# Patient Record
Sex: Female | Born: 1965 | Race: Black or African American | Hispanic: No | Smoking: Never smoker
Health system: Southern US, Community
[De-identification: ages and names within clinical notes are randomized; demographics above are authoritative.]

## PROBLEM LIST (undated history)

## (undated) DIAGNOSIS — K219 Gastro-esophageal reflux disease without esophagitis: Secondary | ICD-10-CM

## (undated) DIAGNOSIS — M1990 Unspecified osteoarthritis, unspecified site: Secondary | ICD-10-CM

## (undated) DIAGNOSIS — Z86010 Personal history of colonic polyps: Secondary | ICD-10-CM

## (undated) DIAGNOSIS — F430 Acute stress reaction: Secondary | ICD-10-CM

## (undated) DIAGNOSIS — R61 Generalized hyperhidrosis: Secondary | ICD-10-CM

## (undated) DIAGNOSIS — T7840XA Allergy, unspecified, initial encounter: Secondary | ICD-10-CM

## (undated) DIAGNOSIS — D219 Benign neoplasm of connective and other soft tissue, unspecified: Secondary | ICD-10-CM

## (undated) HISTORY — PX: BREAST SURGERY: SHX581

## (undated) HISTORY — DX: Personal history of colonic polyps: Z86.010

## (undated) HISTORY — DX: Generalized hyperhidrosis: R61

## (undated) HISTORY — DX: Gastro-esophageal reflux disease without esophagitis: K21.9

## (undated) HISTORY — DX: Unspecified osteoarthritis, unspecified site: M19.90

## (undated) HISTORY — DX: Allergy, unspecified, initial encounter: T78.40XA

## (undated) HISTORY — PX: COLONOSCOPY: SHX174

## (undated) HISTORY — PX: HYSTEROSCOPY: SHX211

## (undated) HISTORY — PX: POLYPECTOMY: SHX149

## (undated) HISTORY — PX: BREAST BIOPSY: SHX20

## (undated) HISTORY — DX: Benign neoplasm of connective and other soft tissue, unspecified: D21.9

## (undated) HISTORY — PX: MYOMECTOMY: SHX85

## (undated) HISTORY — DX: Acute stress reaction: F43.0

## (undated) HISTORY — PX: TUBAL LIGATION: SHX77

---

## 1997-09-16 ENCOUNTER — Encounter: Admission: RE | Admit: 1997-09-16 | Discharge: 1997-12-15 | Payer: Self-pay | Admitting: Gynecology

## 1997-09-28 ENCOUNTER — Ambulatory Visit (HOSPITAL_COMMUNITY): Admission: RE | Admit: 1997-09-28 | Discharge: 1997-09-28 | Payer: Self-pay | Admitting: Obstetrics and Gynecology

## 1997-10-11 ENCOUNTER — Encounter (HOSPITAL_COMMUNITY): Admission: RE | Admit: 1997-10-11 | Discharge: 1997-10-15 | Payer: Self-pay | Admitting: Gynecology

## 1997-10-12 ENCOUNTER — Inpatient Hospital Stay (HOSPITAL_COMMUNITY): Admission: AD | Admit: 1997-10-12 | Discharge: 1997-10-14 | Payer: Self-pay | Admitting: Gynecology

## 1997-10-22 ENCOUNTER — Encounter (HOSPITAL_COMMUNITY): Admission: RE | Admit: 1997-10-22 | Discharge: 1997-12-22 | Payer: Self-pay | Admitting: Gynecology

## 1997-11-12 ENCOUNTER — Inpatient Hospital Stay (HOSPITAL_COMMUNITY): Admission: AD | Admit: 1997-11-12 | Discharge: 1997-11-12 | Payer: Self-pay | Admitting: Obstetrics and Gynecology

## 1997-11-19 ENCOUNTER — Inpatient Hospital Stay (HOSPITAL_COMMUNITY): Admission: AD | Admit: 1997-11-19 | Discharge: 1997-11-19 | Payer: Self-pay | Admitting: Gynecology

## 1997-11-23 ENCOUNTER — Encounter (HOSPITAL_COMMUNITY): Admission: RE | Admit: 1997-11-23 | Discharge: 1997-12-22 | Payer: Self-pay | Admitting: Obstetrics and Gynecology

## 1997-12-21 ENCOUNTER — Inpatient Hospital Stay (HOSPITAL_COMMUNITY): Admission: AD | Admit: 1997-12-21 | Discharge: 1997-12-24 | Payer: Self-pay | Admitting: Gynecology

## 1999-08-12 ENCOUNTER — Encounter (INDEPENDENT_AMBULATORY_CARE_PROVIDER_SITE_OTHER): Payer: Self-pay | Admitting: *Deleted

## 1999-08-12 ENCOUNTER — Ambulatory Visit (HOSPITAL_BASED_OUTPATIENT_CLINIC_OR_DEPARTMENT_OTHER): Admission: RE | Admit: 1999-08-12 | Discharge: 1999-08-12 | Payer: Self-pay | Admitting: Surgery

## 1999-08-12 ENCOUNTER — Encounter: Payer: Self-pay | Admitting: Surgery

## 2001-03-19 ENCOUNTER — Encounter: Payer: Self-pay | Admitting: *Deleted

## 2001-03-19 ENCOUNTER — Ambulatory Visit (HOSPITAL_COMMUNITY): Admission: RE | Admit: 2001-03-19 | Discharge: 2001-03-19 | Payer: Self-pay | Admitting: *Deleted

## 2002-04-08 ENCOUNTER — Other Ambulatory Visit: Admission: RE | Admit: 2002-04-08 | Discharge: 2002-04-08 | Payer: Self-pay | Admitting: Gynecology

## 2003-04-13 ENCOUNTER — Other Ambulatory Visit: Admission: RE | Admit: 2003-04-13 | Discharge: 2003-04-13 | Payer: Self-pay | Admitting: Gynecology

## 2003-10-26 ENCOUNTER — Ambulatory Visit (HOSPITAL_COMMUNITY): Admission: RE | Admit: 2003-10-26 | Discharge: 2003-10-26 | Payer: Self-pay | Admitting: Family Medicine

## 2004-04-19 ENCOUNTER — Ambulatory Visit: Payer: Self-pay | Admitting: Family Medicine

## 2004-04-22 ENCOUNTER — Other Ambulatory Visit: Admission: RE | Admit: 2004-04-22 | Discharge: 2004-04-22 | Payer: Self-pay | Admitting: Gynecology

## 2004-05-04 ENCOUNTER — Ambulatory Visit: Payer: Self-pay | Admitting: Family Medicine

## 2004-07-04 ENCOUNTER — Ambulatory Visit: Payer: Self-pay | Admitting: Family Medicine

## 2004-09-09 ENCOUNTER — Ambulatory Visit: Payer: Self-pay | Admitting: Internal Medicine

## 2004-11-25 ENCOUNTER — Ambulatory Visit: Payer: Self-pay | Admitting: Family Medicine

## 2004-12-01 ENCOUNTER — Ambulatory Visit: Payer: Self-pay | Admitting: Family Medicine

## 2004-12-07 ENCOUNTER — Ambulatory Visit: Payer: Self-pay | Admitting: Family Medicine

## 2005-01-03 ENCOUNTER — Ambulatory Visit: Payer: Self-pay | Admitting: Family Medicine

## 2005-01-06 ENCOUNTER — Ambulatory Visit: Payer: Self-pay

## 2005-02-22 ENCOUNTER — Ambulatory Visit: Payer: Self-pay | Admitting: Family Medicine

## 2005-04-03 ENCOUNTER — Encounter: Payer: Self-pay | Admitting: Family Medicine

## 2005-04-03 LAB — CONVERTED CEMR LAB

## 2005-04-25 ENCOUNTER — Other Ambulatory Visit: Admission: RE | Admit: 2005-04-25 | Discharge: 2005-04-25 | Payer: Self-pay | Admitting: Gynecology

## 2005-08-02 ENCOUNTER — Encounter: Payer: Self-pay | Admitting: Obstetrics and Gynecology

## 2005-08-11 ENCOUNTER — Ambulatory Visit: Payer: Self-pay | Admitting: Family Medicine

## 2005-09-05 ENCOUNTER — Ambulatory Visit (HOSPITAL_BASED_OUTPATIENT_CLINIC_OR_DEPARTMENT_OTHER): Admission: RE | Admit: 2005-09-05 | Discharge: 2005-09-05 | Payer: Self-pay | Admitting: Surgery

## 2005-09-05 ENCOUNTER — Encounter (INDEPENDENT_AMBULATORY_CARE_PROVIDER_SITE_OTHER): Payer: Self-pay | Admitting: *Deleted

## 2006-02-08 ENCOUNTER — Ambulatory Visit: Payer: Self-pay | Admitting: Family Medicine

## 2006-04-26 ENCOUNTER — Other Ambulatory Visit: Admission: RE | Admit: 2006-04-26 | Discharge: 2006-04-26 | Payer: Self-pay | Admitting: Gynecology

## 2006-08-01 ENCOUNTER — Encounter: Payer: Self-pay | Admitting: Family Medicine

## 2006-08-01 DIAGNOSIS — F411 Generalized anxiety disorder: Secondary | ICD-10-CM | POA: Insufficient documentation

## 2006-08-01 DIAGNOSIS — F418 Other specified anxiety disorders: Secondary | ICD-10-CM

## 2006-08-14 ENCOUNTER — Ambulatory Visit: Payer: Self-pay | Admitting: Family Medicine

## 2006-08-17 ENCOUNTER — Encounter: Payer: Self-pay | Admitting: Family Medicine

## 2006-08-30 ENCOUNTER — Encounter (INDEPENDENT_AMBULATORY_CARE_PROVIDER_SITE_OTHER): Payer: Self-pay | Admitting: *Deleted

## 2006-09-05 ENCOUNTER — Encounter (INDEPENDENT_AMBULATORY_CARE_PROVIDER_SITE_OTHER): Payer: Self-pay | Admitting: *Deleted

## 2006-09-28 ENCOUNTER — Ambulatory Visit: Payer: Self-pay | Admitting: Internal Medicine

## 2006-09-28 DIAGNOSIS — R079 Chest pain, unspecified: Secondary | ICD-10-CM | POA: Insufficient documentation

## 2007-01-24 ENCOUNTER — Ambulatory Visit: Payer: Self-pay | Admitting: Family Medicine

## 2007-01-25 DIAGNOSIS — M26609 Unspecified temporomandibular joint disorder, unspecified side: Secondary | ICD-10-CM

## 2007-01-25 DIAGNOSIS — S0300XA Dislocation of jaw, unspecified side, initial encounter: Secondary | ICD-10-CM | POA: Insufficient documentation

## 2007-04-04 LAB — CONVERTED CEMR LAB: Pap Smear: NORMAL

## 2007-04-29 ENCOUNTER — Other Ambulatory Visit: Admission: RE | Admit: 2007-04-29 | Discharge: 2007-04-29 | Payer: Self-pay | Admitting: Gynecology

## 2007-05-14 ENCOUNTER — Ambulatory Visit: Payer: Self-pay | Admitting: Family Medicine

## 2007-05-14 DIAGNOSIS — M545 Low back pain, unspecified: Secondary | ICD-10-CM | POA: Insufficient documentation

## 2007-05-14 DIAGNOSIS — M549 Dorsalgia, unspecified: Secondary | ICD-10-CM | POA: Insufficient documentation

## 2007-07-19 ENCOUNTER — Ambulatory Visit: Payer: Self-pay | Admitting: Family Medicine

## 2007-09-30 ENCOUNTER — Encounter: Payer: Self-pay | Admitting: Family Medicine

## 2007-10-02 ENCOUNTER — Encounter (INDEPENDENT_AMBULATORY_CARE_PROVIDER_SITE_OTHER): Payer: Self-pay | Admitting: *Deleted

## 2007-10-22 ENCOUNTER — Ambulatory Visit: Payer: Self-pay | Admitting: Family Medicine

## 2007-10-22 DIAGNOSIS — L74519 Primary focal hyperhidrosis, unspecified: Secondary | ICD-10-CM | POA: Insufficient documentation

## 2007-10-23 LAB — CONVERTED CEMR LAB
ALT: 11 units/L (ref 0–35)
AST: 20 units/L (ref 0–37)
Albumin: 4.1 g/dL (ref 3.5–5.2)
Alkaline Phosphatase: 60 units/L (ref 39–117)
BUN: 8 mg/dL (ref 6–23)
Basophils Absolute: 0 10*3/uL (ref 0.0–0.1)
Basophils Relative: 0.3 % (ref 0.0–3.0)
Bilirubin, Direct: 0.1 mg/dL (ref 0.0–0.3)
CO2: 29 meq/L (ref 19–32)
Calcium: 9.5 mg/dL (ref 8.4–10.5)
Chloride: 102 meq/L (ref 96–112)
Cholesterol: 196 mg/dL (ref 0–200)
Creatinine, Ser: 0.8 mg/dL (ref 0.4–1.2)
Eosinophils Absolute: 0 10*3/uL (ref 0.0–0.7)
Eosinophils Relative: 0.6 % (ref 0.0–5.0)
GFR calc Af Amer: 101 mL/min
GFR calc non Af Amer: 83 mL/min
Glucose, Bld: 88 mg/dL (ref 70–99)
HCT: 33.9 % — ABNORMAL LOW (ref 36.0–46.0)
HDL: 75.1 mg/dL (ref >39.0)
Hemoglobin: 11.3 g/dL — ABNORMAL LOW (ref 12.0–15.0)
LDL Cholesterol: 106 mg/dL — ABNORMAL HIGH (ref 0–99)
Lymphocytes Relative: 39.7 % (ref 12.0–46.0)
MCHC: 33.3 g/dL (ref 30.0–36.0)
MCV: 85.3 fL (ref 78.0–100.0)
Monocytes Absolute: 0.3 10*3/uL (ref 0.1–1.0)
Monocytes Relative: 5 % (ref 3.0–12.0)
Neutro Abs: 3 10*3/uL (ref 1.4–7.7)
Neutrophils Relative %: 54.4 % (ref 43.0–77.0)
Platelets: 343 10*3/uL (ref 150–400)
Potassium: 4.5 meq/L (ref 3.5–5.1)
RBC: 3.97 M/uL (ref 3.87–5.11)
RDW: 15.5 % — ABNORMAL HIGH (ref 11.5–14.6)
Sodium: 137 meq/L (ref 135–145)
TSH: 1.07 microintl units/mL (ref 0.35–5.50)
Total Bilirubin: 0.9 mg/dL (ref 0.3–1.2)
Total CHOL/HDL Ratio: 2.6
Total Protein: 7 g/dL (ref 6.0–8.3)
Triglycerides: 77 mg/dL (ref 0–149)
VLDL: 15 mg/dL (ref 0–40)
WBC: 5.4 10*3/uL (ref 4.5–10.5)

## 2007-12-24 ENCOUNTER — Ambulatory Visit: Payer: Self-pay | Admitting: Gynecology

## 2008-01-15 ENCOUNTER — Ambulatory Visit: Payer: Self-pay | Admitting: Gynecology

## 2008-01-17 ENCOUNTER — Ambulatory Visit: Payer: Self-pay | Admitting: Family Medicine

## 2008-01-17 LAB — CONVERTED CEMR LAB
Bacteria, UA: 0
Bilirubin Urine: NEGATIVE
Blood in Urine, dipstick: NEGATIVE
Casts: 0 /lpf
Epithelial cells, urine: 1 /lpf
Glucose, Urine, Semiquant: NEGATIVE
Ketones, urine, test strip: NEGATIVE
Mucus, UA: 0
Nitrite: NEGATIVE
Protein, U semiquant: NEGATIVE
RBC / HPF: 0
Specific Gravity, Urine: <1.005
Urine crystals, microscopic: 0 /hpf
Urobilinogen, UA: 0.2
WBC, UA: 0 cells/hpf
Yeast, UA: 0
pH: 7

## 2008-02-07 ENCOUNTER — Ambulatory Visit: Payer: Self-pay | Admitting: Gynecology

## 2008-02-13 ENCOUNTER — Ambulatory Visit: Payer: Self-pay | Admitting: Gynecology

## 2008-02-13 ENCOUNTER — Encounter: Payer: Self-pay | Admitting: Gynecology

## 2008-02-13 ENCOUNTER — Ambulatory Visit (HOSPITAL_BASED_OUTPATIENT_CLINIC_OR_DEPARTMENT_OTHER): Admission: RE | Admit: 2008-02-13 | Discharge: 2008-02-13 | Payer: Self-pay | Admitting: Gynecology

## 2008-03-03 ENCOUNTER — Ambulatory Visit: Payer: Self-pay | Admitting: Gynecology

## 2008-05-04 ENCOUNTER — Encounter: Payer: Self-pay | Admitting: Gynecology

## 2008-05-04 ENCOUNTER — Ambulatory Visit: Payer: Self-pay | Admitting: Gynecology

## 2008-05-04 ENCOUNTER — Other Ambulatory Visit: Admission: RE | Admit: 2008-05-04 | Discharge: 2008-05-04 | Payer: Self-pay | Admitting: Gynecology

## 2008-08-03 ENCOUNTER — Telehealth: Payer: Self-pay | Admitting: Family Medicine

## 2008-08-05 ENCOUNTER — Ambulatory Visit: Payer: Self-pay | Admitting: Family Medicine

## 2008-08-05 LAB — CONVERTED CEMR LAB
Bilirubin Urine: NEGATIVE
Blood in Urine, dipstick: NEGATIVE
Glucose, Urine, Semiquant: NEGATIVE
Ketones, urine, test strip: NEGATIVE
Nitrite: NEGATIVE
Protein, U semiquant: NEGATIVE
Specific Gravity, Urine: <1.005
Urobilinogen, UA: 0.2
WBC Urine, dipstick: NEGATIVE
pH: 7

## 2008-09-01 ENCOUNTER — Encounter: Payer: Self-pay | Admitting: Family Medicine

## 2008-09-17 ENCOUNTER — Ambulatory Visit: Payer: Self-pay | Admitting: Family Medicine

## 2008-09-30 ENCOUNTER — Encounter: Payer: Self-pay | Admitting: Family Medicine

## 2008-10-06 ENCOUNTER — Encounter (INDEPENDENT_AMBULATORY_CARE_PROVIDER_SITE_OTHER): Payer: Self-pay | Admitting: *Deleted

## 2008-10-22 ENCOUNTER — Ambulatory Visit: Payer: Self-pay | Admitting: Family Medicine

## 2008-12-21 ENCOUNTER — Ambulatory Visit: Payer: Self-pay | Admitting: Gynecology

## 2008-12-24 ENCOUNTER — Ambulatory Visit: Payer: Self-pay | Admitting: Gynecology

## 2008-12-28 ENCOUNTER — Telehealth: Payer: Self-pay | Admitting: Family Medicine

## 2009-01-12 ENCOUNTER — Encounter: Payer: Self-pay | Admitting: Family Medicine

## 2009-01-13 ENCOUNTER — Ambulatory Visit: Payer: Self-pay | Admitting: Family Medicine

## 2009-02-17 ENCOUNTER — Ambulatory Visit: Payer: Self-pay | Admitting: Family Medicine

## 2009-02-17 LAB — CONVERTED CEMR LAB
Bilirubin Urine: NEGATIVE
Blood in Urine, dipstick: NEGATIVE
Casts: 0 /lpf
Glucose, Urine, Semiquant: NEGATIVE
Ketones, urine, test strip: NEGATIVE
Nitrite: NEGATIVE
Protein, U semiquant: NEGATIVE
Specific Gravity, Urine: <1.005
Urine crystals, microscopic: 0 /hpf
Urobilinogen, UA: 0.2
WBC Urine, dipstick: NEGATIVE
Yeast, UA: 0
pH: 7

## 2009-02-19 ENCOUNTER — Telehealth: Payer: Self-pay | Admitting: Family Medicine

## 2009-05-12 ENCOUNTER — Ambulatory Visit: Payer: Self-pay | Admitting: Gynecology

## 2009-05-12 ENCOUNTER — Other Ambulatory Visit: Admission: RE | Admit: 2009-05-12 | Discharge: 2009-05-12 | Payer: Self-pay | Admitting: Gynecology

## 2009-05-24 ENCOUNTER — Ambulatory Visit: Payer: Self-pay | Admitting: Gynecology

## 2009-06-14 ENCOUNTER — Emergency Department: Payer: Self-pay | Admitting: Emergency Medicine

## 2009-08-02 ENCOUNTER — Ambulatory Visit: Payer: Self-pay | Admitting: Gynecology

## 2009-08-25 ENCOUNTER — Telehealth: Payer: Self-pay | Admitting: Family Medicine

## 2009-09-06 ENCOUNTER — Ambulatory Visit: Payer: Self-pay | Admitting: Gynecology

## 2009-10-08 ENCOUNTER — Encounter: Payer: Self-pay | Admitting: Family Medicine

## 2009-10-12 ENCOUNTER — Encounter: Payer: Self-pay | Admitting: Family Medicine

## 2009-10-13 ENCOUNTER — Ambulatory Visit: Payer: Self-pay | Admitting: Family Medicine

## 2009-10-18 ENCOUNTER — Encounter: Payer: Self-pay | Admitting: Family Medicine

## 2009-10-22 ENCOUNTER — Ambulatory Visit: Payer: Self-pay | Admitting: Gynecology

## 2009-11-02 ENCOUNTER — Ambulatory Visit: Payer: Self-pay | Admitting: Family Medicine

## 2009-11-12 ENCOUNTER — Ambulatory Visit: Payer: Self-pay | Admitting: Gynecology

## 2009-12-02 ENCOUNTER — Ambulatory Visit: Payer: Self-pay | Admitting: Gynecology

## 2010-01-31 ENCOUNTER — Ambulatory Visit: Payer: Self-pay | Admitting: Gynecology

## 2010-03-24 ENCOUNTER — Ambulatory Visit: Payer: Self-pay | Admitting: Gynecology

## 2010-05-03 NOTE — Letter (Signed)
Summary: Results Follow up Letter  [place] [facility]  [address] [place], [place] [address]   Phone: [phone]  Fax: [phone]    10/12/2009 MRN: [id number]    Peggy Hicks [address] [place], [place]  [address]    Dear [name],  The following are the results of your recent test(s):  Test         Result    Pap Smear:        Normal _____  Not Normal _____ Comments: ______________________________________________________ Cholesterol: LDL(Bad cholesterol):         Your goal is less than:         HDL (Good cholesterol):       Your goal is more than: Comments:  ______________________________________________________ Mammogram:        Normal __X___  Not Normal _____ Comments:Repeat in one year.   ___________________________________________________________________ Hemoccult:        Normal _____  Not normal _______ Comments:    _____________________________________________________________________ Other Tests:    We routinely do not discuss normal results over the telephone.  If you desire a copy of the results, or you have any questions about this information we can discuss them at your next office visit.   Sincerely,    Idamae Schuller Tower,MD  MT/ri

## 2010-05-03 NOTE — Miscellaneous (Signed)
Summary: mammogram screening  Clinical Lists Changes  Observations: Added new observation of MAMMO DUE: 10/2010 (10/12/2009 11:15) Added new observation of MAMMOGRAM: normal (10/08/2009 11:15)      Preventive Care Screening  Mammogram:    Date:  10/08/2009    Next Due:  10/2010    Results:  normal

## 2010-05-03 NOTE — Assessment & Plan Note (Signed)
Summary: cpx R/S FROM 11/03/09   Vital Signs:  Patient profile:   45 year old female Height:      66 inches Weight:      154.25 pounds BMI:     24.99 Temp:     98.2 degrees F oral Pulse rate:   64 / minute Pulse rhythm:   regular BP sitting:   122 / 80  (left arm) Cuff size:   regular  Vitals Entered By: Lewanda Rife LPN (November 02, 2009 3:39 PM) CC: CPX GYN does pap and breast exam   History of Present Illness: here for wellness exam and to rev chronic med problems  is doing well overall   has just been tx for sinus infection - getting better   wt is down 2 lb- did not eat lunch toda   bp good 122/80  lipids good with trig 74 and HDL 102 and LDL 92  fibroids removed in past gyn jan -- pap was normal  mam 7/11 - neg, no lumps or changes on self exam   Td 07  is trying to walk at least 3 miles every other day -- [facility] [employer] when weather is too hot  is eating healthy diet   low back is hurting today -- thinks due to period      Allergies (verified): No Known Drug Allergies  Past History:  Past Medical History: Last updated: 01/17/2008 Anxiety Frequent yeast vaginitis fibroids in past  back pain hyperhidrosis TMJ stress reaction  Past Surgical History: Last updated: 08/01/2006 breast reduction uterine fibrods removed  Family History: Last updated: 10/22/2007 mother with HTN, CVA, DM  father is healthy aunt with colon ca  Social History: Last updated: 10/22/2007 works for [employer] exercises regularly  non smoker no alcohol   Risk Factors: Smoking Status: never (08/01/2006)  Review of Systems General:  Denies fatigue, loss of appetite, and malaise. Eyes:  Denies blurring and eye irritation. CV:  Denies chest pain or discomfort, palpitations, and shortness of breath with exertion. Resp:  Denies cough, shortness of breath, and wheezing. GI:  Denies abdominal pain, bloody stools, change in bowel habits, indigestion, and nausea. GU:   Denies dysuria and urinary frequency. MS:  Complains of low back pain; denies cramps and muscle weakness. Derm:  Denies lesion(s), poor wound healing, and rash. Neuro:  Denies numbness and tingling. Psych:  Denies anxiety and depression. Endo:  Denies cold intolerance, excessive thirst, excessive urination, and heat intolerance. Heme:  Denies abnormal bruising and bleeding.  Physical Exam  General:  Well-developed,well-nourished,in no acute distress; alert,appropriate and cooperative throughout examination Head:  normocephalic, atraumatic, and no abnormalities observed.   Eyes:  vision grossly intact, pupils equal, pupils round, and pupils reactive to light.  no conjunctival pallor, injection or icterus  Ears:  R ear normal and L ear normal.   Nose:  no nasal discharge.   Mouth:  pharynx pink and moist.   Neck:  supple with full rom and no masses or thyromegally, no JVD or carotid bruit  Chest Wall:  No deformities, masses, or tenderness noted. Lungs:  Normal respiratory effort, chest expands symmetrically. Lungs are clear to auscultation, no crackles or wheezes. Heart:  Normal rate and regular rhythm. S1 and S2 normal without gallop, murmur, click, rub or other extra sounds. Abdomen:  Bowel sounds positive,abdomen soft and non-tender without masses, organomegaly or hernias noted. no renal bruits  Msk:  No deformity or scoliosis noted of thoracic or lumbar spine.  Pulses:  R and L carotid,radial,femoral,dorsalis pedis and posterior tibial pulses are full and equal bilaterally Extremities:  No clubbing, cyanosis, edema, or deformity noted with normal full range of motion of all joints.   Neurologic:  sensation intact to light touch, gait normal, and DTRs symmetrical and normal.   Skin:  Intact without suspicious lesions or rashes Cervical Nodes:  No lymphadenopathy noted Inguinal Nodes:  No significant adenopathy Psych:  normal affect, talkative and pleasant    Impression &  Recommendations:  Problem # 1:  HEALTH MAINTENANCE EXAM (ICD-V70.0) Assessment Comment Only reviewed health habits including diet, exercise and skin cancer prevention reviewed health maintenance list and family history commended on good habits labs reviewed in detail  Problem # 2:  BACK PAIN (ICD-724.5) Assessment: Deteriorated intermittent worse with menses refil flexeril adv to update if worse or not imp next week Her updated medication list for this problem includes:    Flexeril 10 Mg Tabs (Cyclobenzaprine hcl) .Marland Kitchen... 1/2 to 1 by mouth up to three times a day as needed back pain    Aleve 220 Mg Tabs (Naproxen sodium) ..... Otc as directed.  Complete Medication List: 1)  One-a-day Womens Tabs (multiple Vitamins-calcium)-- Iron  .... One by mouth daily 2)  Iron Supplement 325 (65 Fe) Mg Tabs (Ferrous sulfate) .... One by mouth daily 3)  Alprazolam 0.5 Mg Tabs (Alprazolam) .Marland Kitchen.. 1 by mouth up to two times a day as needed airplane anxiety 4)  Flexeril 10 Mg Tabs (Cyclobenzaprine hcl) .... 1/2 to 1 by mouth up to three times a day as needed back pain 5)  Fluticasone Propionate 50 Mcg/act Susp (Fluticasone propionate) .... 2 sprays each nostril once daily as needed 6)  Aleve 220 Mg Tabs (Naproxen sodium) .... Otc as directed.  Patient Instructions: 1)  keep up the good work with healthy diet and exercise  2)  labs look good including cholesterol  Prescriptions: FLEXERIL 10 MG TABS (CYCLOBENZAPRINE HCL) 1/2 to 1 by mouth up to three times a day as needed back pain  #30 x 0   Entered and Authorized by:   Judith Part MD   Signed by:   Judith Part MD on 11/02/2009   Method used:   [facility] to        [facility] [phone][facility] (retail)       [address]       [place], [place]  [address]       Ph: [phone]       Fax: [phone]   RxID:   [id number]   Current Allergies (reviewed today): No known allergies

## 2010-05-03 NOTE — Miscellaneous (Signed)
Summary: Controlled Substance Agreement  Controlled Substance Agreement   Imported By: Lanelle Bal 11/09/2009 08:52:45  _____________________________________________________________________  External Attachment:    Type:   Image     Comment:   External Document

## 2010-05-03 NOTE — Progress Notes (Signed)
Summary: labs for cpx  Phone Note Call from Patient Call back at Home Phone [phone]   Caller: Patient Call For: Judith Part MD Summary of Call: Patient has cpx on 11-03-09. She needs rx for labs so that she can have them done at [employer] where she works. She would like them mailed to her house.  Initial call taken by: Melody Comas,  Aug 25, 2009 12:07 PM  Follow-up for Phone Call        px for labs done for wellness Follow-up by: Judith Part MD,  Aug 25, 2009 2:42 PM  Additional Follow-up for Phone Call Additional follow up Details #1::        Left message for patient to call back.  Order for labs mailed to pt's home address.Lewanda Rife LPN  Aug 25, 2009 4:38 PM   Left message for patient to call back. Lewanda Rife LPN  Aug 26, 2009 11:12 AM   Patient's husband notified as instructed by telephone.Lewanda Rife LPN  Aug 27, 2009 8:18 AM      Appended Document: labs for cpx Pt called to verify lab order was mailed. Patient notified as instructed by telephone.

## 2010-05-03 NOTE — Assessment & Plan Note (Signed)
Summary: JAW PAIN/CLE  Chest pain/shoulder pain/   Vital Signs:  Patient Profile:   45 Years Old Female Height:     66 inches (167.64 cm) Weight:      161.50 pounds Temp:     98.1 degrees F oral Pulse rate:   76 / minute Pulse rhythm:   regular BP sitting:   110 / 74  (left arm) Cuff size:   regular  Vitals Entered ByMarland Kitchen Delilah Shan (January 24, 2007 12:19 PM)                 Chief Complaint:  Woke up last evening with pain in jaw area as well as other areas of her body.Marland Kitchen  History of Present Illness: recent heart burn x 4 days, some insomnia sudden last night awoke from sleep with chest pain under right breats also some tenderness in left shoulder and right jaw  all symptoms lasted 1 minute, mild started with jaw then to chest and shoulder no nausea, no sweating, no SOB no chest pain with exercise  Seen in 6/08 with similar  no family , 6 with MI doesn't smoke nml chol no HTN no PVD, no DM  Current Allergies (reviewed today): No known allergies   Past Medical History:    Reviewed history from 08/01/2006 and no changes required:       Anxiety       Frequent yeast vaginitis   Family History:    Reviewed history from 08/14/2006 and no changes required:       mother with HTN, CVA       aunt with colon ca    Review of Systems      See HPI   Physical Exam  General:     Well-developed,well-nourished,in no acute distress; alert,appropriate and cooperative throughout examination Head:     Normocephalic and atraumatic without obvious abnormalities. No apparent alopecia or balding. Ears:     External ear exam shows no significant lesions or deformities.  Otoscopic examination reveals clear canals, tympanic membranes are intact bilaterally without bulging, retraction, inflammation or discharge. Hearing is grossly normal bilaterally. Nose:     External nasal examination shows no deformity or inflammation. Nasal mucosa are pink and moist without lesions or  exudates. Mouth:     ttp over right TMJ, no popping or clicking Lungs:     Normal respiratory effort, chest expands symmetrically. Lungs are clear to auscultation, no crackles or wheezes. Heart:     Normal rate and regular rhythm. S1 and S2 normal without gallop, murmur, click, rub or other extra sounds. Abdomen:     Bowel sounds positive,abdomen soft and non-tender without masses, organomegaly or hernias noted.    Impression & Recommendations:  Problem # 1:  CHEST PAIN (ICD-786.50) Doubt cardiac etiology given description of chest pain and lack of cardiac risk factors.  EKG wnl.  Most likely pain from reflux, pt was given behavoiral modification info.  If not better may try prilosec.  if concern remains, discuss stress test with primary MD. 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  Use NSAIDs, mouth gaurd, stop gum chewing.   Patient Instructions: 1)  If behavoiral techniques don't help, try prilosec 2 x 20 mg daily x 4-6 weeks.  If still not better f/up with primary MD.    ] Prior Medications (reviewed today): None Current Allergies (reviewed today): No known allergies    EKG  Procedure date:  01/24/2007  Findings:      Normal sinus rhythm with rate of:  58, no ST chenges, no Q waves

## 2010-05-03 NOTE — Assessment & Plan Note (Signed)
Summary: HEADACHE/ lb   Vital Signs:  Patient profile:   45 year old female Weight:      156 pounds BMI:     25.27 Temp:     99.1 degrees F oral Pulse rate:   84 / minute Pulse rhythm:   regular BP sitting:   122 / 80  (left arm) Cuff size:   regular CC: Headache   History of Present Illness: waking up with a headache frontal and mostly maxillary sinus tenderness for 3 days.  No recent URI or illness, but she does have some occ allergies. Occ benadryl use.  no light or sound bothering.   no earache, n/v/d, no rashes.    Allergies (verified): No Known Drug Allergies  Past History:  Past medical, surgical, family and social histories (including risk factors) reviewed, and no changes noted (except as noted below).  Past Medical History: Reviewed history from 01/17/2008 and no changes required. Anxiety Frequent yeast vaginitis fibroids in past  back pain hyperhidrosis TMJ stress reaction  Past Surgical History: Reviewed history from 08/01/2006 and no changes required. breast reduction uterine fibrods removed  Family History: Reviewed history from 10/22/2007 and no changes required. mother with HTN, CVA, DM  father is healthy aunt with colon ca  Social History: Reviewed history from 10/22/2007 and no changes required. works for [employer] exercises regularly  non smoker no alcohol   Review of Systems       REVIEW OF SYSTEMS GEN: Acute illness details above. CV: No chest pain or SOB GI: No noted N or V Otherwise, pertinent positives and negatives are noted in the HPI.   Physical Exam  Additional Exam:  Gen: WDWN, NAD; alert,appropriate and cooperative throughout exam  HEENT: Normocephalic and atraumatic. Throat clear, w/o exudate, no LAD, R TM clear, L TM - good landmarks, No fluid present. rhinnorhea.  Left frontal and maxillary sinuses: Tender, max Right frontal and maxillary sinuses: Tender, max  Neck: No ant or post LAD  CV: RRR, No  M/G/R  Pulm: Breathing comfortably in no resp distress. no w/c/r  Abd: S,NT,ND,+BS  Extr: no c/c/e  Psych: full affect, pleasant    Impression & Recommendations:  Problem # 1:  SINUSITIS - ACUTE-NOS (ICD-461.9) Assessment New I believe c/w sinusitis  cont with tylenol, ibuprofen amox  flonase to help with nasal cong / allergy  Her updated medication list for this problem includes:    Cipro 250 Mg Tabs (Ciprofloxacin hcl) .Marland Kitchen... 1 by mouth two times a day for 5 days    Amoxicillin 875 Mg Tabs (Amoxicillin) .Marland Kitchen... 1 by mouth two times a day    Fluticasone Propionate 50 Mcg/act Susp (Fluticasone propionate) .Marland Kitchen... 2 sprays each nostril once daily  Problem # 2:  OTHER DISEASES OF NASAL CAVITY AND SINUSES (ICD-478.19) Assessment: New  Complete Medication List: 1)  One-a-day Womens Tabs (multiple Vitamins-calcium)-- Iron  .... One by mouth daily 2)  Iron Supplement 325 (65 Fe) Mg Tabs (Ferrous sulfate) .... One by mouth daily 3)  Drysol 20 % Soln (Aluminum chloride) .... Apply to affected area (palms of hands ) at bedtime for 2 nights weekly (and wash off the next am) 4)  Aleve  .... As needed back pain 5)  Alprazolam 0.5 Mg Tabs (Alprazolam) .Marland Kitchen.. 1 by mouth up to two times a day as needed airplane anxiety 6)  Flexeril 10 Mg Tabs (Cyclobenzaprine hcl) .... 1/2 to 1 by mouth up to three times a day as needed back pain 7)  Cipro  250 Mg Tabs (Ciprofloxacin hcl) .Marland Kitchen.. 1 by mouth two times a day for 5 days 8)  Amoxicillin 875 Mg Tabs (Amoxicillin) .Marland Kitchen.. 1 by mouth two times a day 9)  Fluticasone Propionate 50 Mcg/act Susp (Fluticasone propionate) .... 2 sprays each nostril once daily Prescriptions: FLUTICASONE PROPIONATE 50 MCG/ACT  SUSP (FLUTICASONE PROPIONATE) 2 sprays each nostril once daily  #1 vial x 0   Entered and Authorized by:   Hannah Beat MD   Signed by:   Hannah Beat MD on 10/13/2009   Method used:   [facility]  [address] [phone]       [address]       [place], [place]  [address]       Ph: [phone]       Fax: [phone]   RxID:   [id number] AMOXICILLIN 875 MG TABS (AMOXICILLIN) 1 by mouth two times a day  #20 x 0   Entered and Authorized by:   Hannah Beat MD   Signed by:   Hannah Beat MD on 10/13/2009   Method used:   [facility]        [facility]  [address] [phone][facility] (retail)       [address]       [place], [place]  [address]       Ph: [phone]       Fax: [phone]   RxID:   [id number]   Current Allergies (reviewed today): No known allergies

## 2010-05-03 NOTE — Assessment & Plan Note (Signed)
Summary: LOW BACK PAIN/CLE   Vital Signs:  Patient profile:   45 year old female Height:      66 inches Weight:      160 pounds BMI:     25.92 Temp:     98 degrees F oral Pulse rate:   68 / minute Pulse rhythm:   regular BP sitting:   120 / 80  (left arm) Cuff size:   regular  Vitals Entered By: Liane Comber (Aug 05, 2008 12:04 PM)  History of Present Illness: problems with lower back pain  was intially sharp when she moved certain ways or walked (last wed) took some flexeril - helped some  then advil  still hurts - but not as sharp no particular trauma- may have been from twisting in the car  is bilateral  no radiation to either leg  no numbness or weakness  laying down makes it worse , and not to bad with walking now  hurts to twist  was fine beforehand - [facility] [employer] [facility] [employer]-- really enjoyed it    Allergies: No Known Drug Allergies  Past History:  Past Medical History:    Anxiety    Frequent yeast vaginitis    fibroids in past     back pain    hyperhidrosis    TMJ    stress reaction     (01/17/2008)  Past Surgical History:    breast reduction    uterine fibrods removed (08/01/2006)  Family History:    mother with HTN, CVA, DM     father is healthy    aunt with colon ca (10/22/2007)  Social History:    works for [employer]    exercises regularly     non smoker    no alcohol  (10/22/2007)  Review of Systems General:  Denies chills, fatigue, and fever. CV:  Denies chest pain or discomfort and palpitations. Resp:  Denies cough. GI:  Denies abdominal pain and indigestion; advil does not bother her stomach. GU:  Denies dysuria, nocturia, and urinary frequency. MS:  Denies joint redness, joint swelling, cramps, and muscle weakness. Derm:  Denies lesion(s) and rash. Neuro:  Denies numbness, tingling, and weakness.  Physical Exam  General:  Well-developed,well-nourished,in no acute distress; alert,appropriate and cooperative throughout  examination Head:  normocephalic, atraumatic, and no abnormalities observed.   Neck:  nl rom no tenderness  Lungs:  Normal respiratory effort, chest expands symmetrically. Lungs are clear to auscultation, no crackles or wheezes. Heart:  Normal rate and regular rhythm. S1 and S2 normal without gallop, murmur, click, rub or other extra sounds. Abdomen:  soft and non-tender.   Msk:  no LS bony tenderness  some muscular tenderness lateral to LS bilat with spasm /palpable  no piriformis tenderness nl rom hips neg slr for leg pain  flex 90deg, full ext  pain on lateral bend and twist   Extremities:  No clubbing, cyanosis, edema, or deformity noted with normal full range of motion of all joints.   Neurologic:  strength normal in all extremities, sensation intact to light touch, gait normal, and DTRs symmetrical and normal.   Skin:  Intact without suspicious lesions or rashes Cervical Nodes:  No lymphadenopathy noted Inguinal Nodes:  No significant adenopathy Psych:  normal affect, talkative and pleasant    Impression & Recommendations:  Problem # 1:  BACK PAIN (ICD-724.5) Assessment Deteriorated recurrent low back pain without radiculopathhy or neurol symptoms  exam consistent with spasm that is improving  disc  sympt care/ heat /stretches  advil ok as needed/ flexeril for more severe pain  ref to PT for pain modalities and exercises to prevent re- occurance  if not imp consider imaging   Her updated medication list for this problem includes:    Flexeril 10 Mg Tabs (Cyclobenzaprine hcl) .Marland Kitchen... 1/2 to 1 by mouth three times a day as needed back pain  Orders: Physical Therapy Referral (PT)  Complete Medication List: 1)  Flexeril 10 Mg Tabs (Cyclobenzaprine hcl) .... 1/2 to 1 by mouth three times a day as needed back pain 2)  One-a-day Womens Tabs (multiple Vitamins-calcium)-- Iron  .... One by mouth daily 3)  Iron Supplement 325 (65 Fe) Mg Tabs (Ferrous sulfate) .... One by mouth  daily 4)  Drysol 20 % Soln (Aluminum chloride) .... Apply to affected area (palms of hands ) at bedtime for 2 nights weekly (and wash off the next am)  Patient Instructions: 1)  use gentle heat on low back  2)  keep stretching  3)  advil ok with food as needed  4)  flexeril for more severe pain- as needed / caution of sedation  5)  we will refer you to PT at check out  6)  update me if pain suddenly worsens or if numbness or pain radiating to leg  Prescriptions: FLEXERIL 10 MG  TABS (CYCLOBENZAPRINE HCL) 1/2 to 1 by mouth three times a day as needed back pain  #30 x 0   Entered and Authorized by:   Judith Part MD   Signed by:   Judith Part MD on 08/05/2008   Method used:   Print then Give to Patient   RxID:   [id number]     Prior Medications (reviewed today): ONE-A-DAY WOMENS   TABS (MULTIPLE VITAMINS-CALCIUM)-- IRON () one by mouth daily IRON SUPPLEMENT 325 (65 FE) MG  TABS (FERROUS SULFATE) one by mouth daily DRYSOL 20 %  SOLN (ALUMINUM CHLORIDE) apply to affected area (palms of hands ) at bedtime for 2 nights weekly (and wash off the next am) Current Allergies (reviewed today): No known allergies  Current Medications (including changes made in today's visit):  FLEXERIL 10 MG  TABS (CYCLOBENZAPRINE HCL) 1/2 to 1 by mouth three times a day as needed back pain * ONE-A-DAY WOMENS   TABS (MULTIPLE VITAMINS-CALCIUM)-- IRON one by mouth daily IRON SUPPLEMENT 325 (65 FE) MG  TABS (FERROUS SULFATE) one by mouth daily DRYSOL 20 %  SOLN (ALUMINUM CHLORIDE) apply to affected area (palms of hands ) at bedtime for 2 nights weekly (and wash off the next am)  Laboratory Results   Urine Tests  Date/Time Received: Aug 05, 2008 12:13 PM Date/Time Reported: Aug 05, 2008 12:13 PM  Routine Urinalysis   Color: colorless Appearance: Clear Glucose: negative   (Normal Range: Negative) Bilirubin: negative   (Normal Range: Negative) Ketone: negative   (Normal Range: Negative) Spec.  Gravity: <1.005   (Normal Range: 1.003-1.035) Blood: negative   (Normal Range: Negative) pH: 7.0   (Normal Range: 5.0-8.0) Protein: negative   (Normal Range: Negative) Urobilinogen: 0.2   (Normal Range: 0-1) Nitrite: negative   (Normal Range: Negative) Leukocyte Esterace: negative   (Normal Range: Negative)

## 2010-05-16 ENCOUNTER — Other Ambulatory Visit (HOSPITAL_COMMUNITY)
Admission: RE | Admit: 2010-05-16 | Discharge: 2010-05-16 | Disposition: A | Discharge status: Home / Self Care | Payer: BC Managed Care – PPO | Source: Ambulatory Visit | Attending: Gynecology | Admitting: Gynecology

## 2010-05-16 ENCOUNTER — Encounter: Payer: BC Managed Care – PPO | Admitting: Gynecology

## 2010-05-16 ENCOUNTER — Other Ambulatory Visit: Payer: Self-pay | Admitting: Gynecology

## 2010-05-16 DIAGNOSIS — Z01419 Encounter for gynecological examination (general) (routine) without abnormal findings: Secondary | ICD-10-CM

## 2010-05-16 DIAGNOSIS — B3731 Acute candidiasis of vulva and vagina: Secondary | ICD-10-CM

## 2010-05-16 DIAGNOSIS — Z13220 Encounter for screening for lipoid disorders: Secondary | ICD-10-CM

## 2010-05-16 DIAGNOSIS — Z833 Family history of diabetes mellitus: Secondary | ICD-10-CM

## 2010-05-16 DIAGNOSIS — R809 Proteinuria, unspecified: Secondary | ICD-10-CM

## 2010-05-16 DIAGNOSIS — Z124 Encounter for screening for malignant neoplasm of cervix: Secondary | ICD-10-CM | POA: Insufficient documentation

## 2010-05-16 DIAGNOSIS — B373 Candidiasis of vulva and vagina: Secondary | ICD-10-CM

## 2010-06-20 ENCOUNTER — Ambulatory Visit (INDEPENDENT_AMBULATORY_CARE_PROVIDER_SITE_OTHER): Payer: BC Managed Care – PPO | Admitting: Family Medicine

## 2010-06-20 ENCOUNTER — Encounter: Payer: Self-pay | Admitting: Family Medicine

## 2010-06-20 DIAGNOSIS — R05 Cough: Secondary | ICD-10-CM

## 2010-06-20 DIAGNOSIS — R059 Cough, unspecified: Secondary | ICD-10-CM

## 2010-06-30 NOTE — Assessment & Plan Note (Signed)
Summary: SCRATCHY THROAT,COUGH,CONGESTION/CLE  BCBS   Vital Signs:  Patient profile:   45 year old female Height:      66 inches Weight:      160 pounds BMI:     25.92 Temp:     98.9 degrees F oral Pulse rate:   84 / minute Pulse rhythm:   regular BP sitting:   122 / 76  (left arm) Cuff size:   regular  Vitals Entered By: Delilah Shan CMA Usbaldo Pannone Dull) (June 20, 2010 3:14 PM) CC: Scratchy throat, cough, congestion.   History of Present Illness: "I thought I had the flu 2 weeks ago."  Achy with URI symptoms.  She was in bed for about 3 days.  She started coughing then.  She got some better but not totally resolved.  Still with cough and postnasal gtt.  No FCNAVD now. Prev with diarrhea  ~2 weeks ago, now resolved.  Some AM sputum, yellow.  Coughing fits.  Sick contacts at work.  Nonsmoker.    Allergies: No Known Drug Allergies  Review of Systems       See HPI.  Otherwise negative.    Physical Exam  General:  GEN: nad, alert and oriented HEENT: mucous membranes moist, TM w/o erythema, nasal epithelium injected, OP with cobblestoning NECK: supple w/o LA CV: rrr. PULM: ctab, no inc wob ABD: soft, +bs EXT: no edema L frontal sinus tender to palpation, minimally   Impression & Recommendations:  Problem # 1:  COUGH (ICD-786.2) Cough likely due to post nasal gtt.  L frontal sinus tender to palpation, mildly. Will hold antibiotics, use is sx persist and then use diflucan if needed.  Use salt water gargles and mints in meantime.  Nontoxic.  She would like to avoid antibiotics therapy if possible and I think she has a good chance of feeling better w/o use as she appears to be improving.  She agrees with plan.    Complete Medication List: 1)  One-a-day Womens Tabs (multiple Vitamins-calcium)-- Iron  .... One by mouth daily 2)  Alprazolam 0.5 Mg Tabs (Alprazolam) .Marland Kitchen.. 1 by mouth up to two times a day as needed airplane anxiety 3)  Flexeril 10 Mg Tabs (Cyclobenzaprine hcl) .... 1/2 to 1  by mouth up to three times a day as needed back pain 4)  Fluticasone Propionate 50 Mcg/act Susp (Fluticasone propionate) .... 2 sprays each nostril once daily as needed 5)  Aleve 220 Mg Tabs (Naproxen sodium) .... Otc as directed. 6)  Amoxicillin 875 Mg Tabs (Amoxicillin) .Marland Kitchen.. 1 by mouth two times a day 7)  Diflucan 150 Mg Tabs (Fluconazole) .Marland Kitchen.. 1 by mouth once daily, repeat in 1 week if needed.  Patient Instructions: 1)  Use cough drops and gargle with warm salt water for your throat.  Start the antibiotics in a few days if you aren't improving.  Take care.  Prescriptions: DIFLUCAN 150 MG TABS (FLUCONAZOLE) 1 by mouth once daily, repeat in 1 week if needed.  #2 x 0   Entered and Authorized by:   Crawford Givens MD   Signed by:   Crawford Givens MD on 06/20/2010   Method used:   Print then Give to Patient   RxID:   [id number] AMOXICILLIN 875 MG TABS (AMOXICILLIN) 1 by mouth two times a day  #20 x 0   Entered and Authorized by:   Crawford Givens MD   Signed by:   Crawford Givens MD on 06/20/2010   Method used:   Print then Give  to Patient   RxID:   [id number]    Orders Added: 1)  Est. Patient Level III [14782]    Current Allergies (reviewed today): No known allergies

## 2010-08-16 NOTE — Op Note (Signed)
Peggy Hicks, Peggy Hicks                 ACCOUNT NO.:  [id number]   MEDICAL RECORD NO.:  [id number]          PATIENT TYPE:  AMB   LOCATION:  [facility]                         FACILITY:  [facility]   PHYSICIAN:  Timothy P. Fontaine, M.D.DATE OF BIRTH:  11-05-1965   DATE OF PROCEDURE:  02/13/2008  DATE OF DISCHARGE:                               OPERATIVE REPORT   PREOPERATIVE DIAGNOSES:  Menorrhagia, leiomyoma.   POSTOPERATIVE DIAGNOSES:  Menorrhagia, leiomyoma.   PROCEDURE:  Hysteroscopic submucous myomectomy.   SURGEON:  Dr. Audie Box.   ANESTHETIC:  General with 1% lidocaine paracervical block.   ESTIMATED BLOOD LOSS:  Minimal.   SORBITOL DISCREPANCY:  600 mL.   COMPLICATIONS:  None.   SPECIMEN:  1. Endometrial curetting.  2. Submucous myoma fragments to pathology.   FINDINGS:  EUA:  External BUS, vagina normal.  Cervix normal.  Uterus  enlarged approximately 8 weeks size, irregular, consistent with myoma.  Adnexa without masses.  Hysteroscopic:  Large anteriorly lower  endometrial surface myoma filling cavity, smaller anterior fundal  submucous myoma.  No other pathology visualized.  Tubal ostia not  clearly visible.   PROCEDURE:  The patient was taken to the operating room, underwent  general anesthesia, was placed in low dorsal lithotomy position,  received a perineal vaginal preparation with Betadine solution per  nursing personnel and in-and-out Foley catheterization.  EUA was  performed.  The patient was draped in usual fashion.  Cervix was  visualized with a weighted speculum.  Anterior lip grasped with single-  tooth tenaculum, and a 1% lidocaine paracervical block was placed, a  total of 10 mL.  Cervix was gently dilated to admit the operative  hysteroscope, and hysteroscopy was performed with findings noted above.  Using the right-angle resectoscope loop, the lower anterior larger myoma  was resected in its entirety through progressive passes and evacuation  of myoma  fragments.  Similar procedure was carried out on the small  upper myoma.  There was a small fragment remaining at the end of the  case within the surrounding myometrium.  It was felt unsafe to chase  this deeper.  This area was cauterized, and again this was an estimated  10% of the myoma volume left.  At the end of the procedure, a sharp  curettage was performed.  Rehysteroscopy showed an empty cavity, good  distention, no evidence of perforation.  The instruments were removed.  There was some oozing from the cervix noted at this point.  A 16-French  Foley catheter was placed  within the uterine cavity and insufflated with 10 mL and will be removed  in the recovery room prior to discharge.  The patient received  intraoperative Toradol.  The patient was subsequently placed in the  supine position, awakened without difficulty, and taken to recovery room  in good condition, having tolerated the procedure well.      Timothy P. Fontaine, M.D.  Electronically Signed     TPF/MEDQ  D:  02/13/2008  T:  02/13/2008  Job:  [id number]

## 2010-08-16 NOTE — H&P (Signed)
Peggy Hicks, Peggy Hicks                 ACCOUNT NO.:  [id number]   MEDICAL RECORD NO.:  [id number]          PATIENT TYPE:  AMB   LOCATION:  [facility]                         FACILITY:  [facility]   PHYSICIAN:  Timothy P. Fontaine, M.D.DATE OF BIRTH:  1965-11-27   DATE OF ADMISSION:  02/13/2008  DATE OF DISCHARGE:                              HISTORY & PHYSICAL   Being admitted 12th of November at 7:30 for day surgery [facility].   CHIEF COMPLAINT:  Menorrhagia, leiomyoma.   HISTORY OF PRESENT ILLNESS:  A 45 year old G2, P2 female status post BTL  with increasing menorrhagia, history of leiomyoma.  Recent sonohistogram  showed multiple myomas largest measuring 35 mm with 2 submucous myomas  measuring 25 and 29 mm.  The patient is admitted for hysteroscopic  resection D&C.   PAST MEDICAL HISTORY:  Borderline hypertension.   PAST SURGICAL HISTORY:  1. Cesarean section x2.  2. BTL.  3. Abdominal myomectomy.  4. Breast biopsy x2.  5. Mammoplasty.   ALLERGIES:  No medications.   CURRENT MEDICATIONS:  1. Vitamins.  2. Fish oil.   REVIEW OF SYSTEMS:  Noncontributory.   FAMILY HISTORY:  Noncontributory.   SOCIAL HISTORY:  Noncontributory.   ADMISSION PHYSICAL EXAMINATION:  VITAL SIGNS:  Afebrile, vital signs are  stable, blood pressure was 130/80.  HEENT:  Normal.  LUNGS:  Clear.  CARDIAC:  Regular rate without rubs, murmurs or gallops.  ABDOMEN:  Benign.  PELVIC:  External BUS, vagina normal.  Cervix grossly normal.  Uterus  approximately 8 weeks' size, irregular, consistent with myomas.  Adnexa  without masses or tenderness.   ASSESSMENT:  A 45 year old G2, P2 female status post tubal  sterilization, multiple small myomas, increasing menorrhagia with 2  clear intracavitary submucous myomas for hysteroscopic evaluation and  resection.  I reviewed the proposed surgery with the patient.  Was  involved with hysteroscopic myomectomy to include cervical dilatation,  use  of the hysteroscope, resectoscope, D&C.  The patient understands we  are not removing all of her myomas, but only that portion that protrudes  into the cavity and hopefully this will lighten her menses, but again  she understands that she does have multiple other myomas and that this  is not a guarantee to take care of her menorrhagia or that she will not  have a recurrence of her menorrhagia.  I reviewed the procedure in  detail and the risks to include infection, bleeding, transfusion,  uterine perforation, damage to internal organs including bowel, bladder,  ureters, vessels, and nerves necessitating major exploratory reparative  surgeries, future reparative surgeries, ostomy formation, bowel  resection, bladder repair, ureteral damage repair as well as the risk of  sorbitol absorption leading to metabolic complications, seizures,  and  comas were all reviewed with her.  She also understands given the myomas  that we may not be able to complete the procedure at this time.  They  may be only able to remove partial parts of the myomas if significant  absorption occurs during the case.  The patient's questions were  answered to her  satisfaction.  She is ready to proceed with surgery.      Timothy P. Fontaine, M.D.  Electronically Signed     TPF/MEDQ  D:  02/10/2008  T:  02/10/2008  Job:  [id number]

## 2010-08-19 NOTE — Op Note (Signed)
Peggy Hicks, Peggy Hicks                 ACCOUNT NO.:  [id number]   MEDICAL RECORD NO.:  [id number]          PATIENT TYPE:  AMB   LOCATION:  [facility]                          FACILITY:  [facility]   PHYSICIAN:  Wilmon Arms. Corliss Skains, M.D. DATE OF BIRTH:  [birth year]   DATE OF PROCEDURE:  09/05/2005  DATE OF DISCHARGE:                                 OPERATIVE REPORT   PREOPERATIVE DIAGNOSIS:  Bloody right nipple discharge with an occluded  duct.   POSTOPERATIVE DIAGNOSIS:  Bloody right nipple discharge with an occluded  duct.   PROCEDURE PERFORMED:  Right nipple duct excision.   SURGEON:  Wilmon Arms. Tsuei, M.D.   ANESTHESIA:  General via LMA.   INDICATIONS:  The patient is a 45 year old female who is status post a right  breast biopsy for fibroadenoma in 2001 who presents with a two month history  of nipple discharge with bloody discharge from the right nipple.  She  underwent a workup including mammogram which was negative.  Ultrasound  showed a mildly dilated duct with no evidence of intraluminal filling  defect.  She then underwent a ductogram on July 26, 2005, which showed a  short segment of distal duct and then complete cut off of the contrast  consistent with an obstruction.  The patient presents for duct excision for  biopsy.   DESCRIPTION OF PROCEDURE:  The patient was brought to the operating and  placed in a supine position on the operating table.  After an adequate level  of general anesthesia was obtained, her right breast was prepped with  Betadine and draped in a sterile fashion.  I was able to express a small  amount of clear drainage from the upper inner quadrant of her right nipple.  The patient has a previous circumareolar incision from her breast reduction  surgery.  This incision was infiltrated with 0.25% Marcaine.  We reopened  part of the circumareolar incision and carried our dissection posterior to  the nipple using cautery.  I was able to pass a lacrimal duct probe into  the  ductal opening that was draining.  This helped me identify the duct in  question.  We grasped the duct with a hemostat and amputated it on the  posterior surface of the nipple.  I then excised a circular cone of tissue  around the duct extending approximately 3 cm.  The specimen was oriented  with a suture at the superficial apex.  This was passed off the field and  sent for pathologic examination.  The wound was then irrigated and  hemostasis was obtained with cautery.  The wound was closed with a deep  layer of 3-0 Vicryl and a subcuticular layer of 4-0 Monocryl.  There was  some superficial epidural lysis.  Benzoin and Steri-Strips were used to  cover the wound and a gauze dressing was taped over the wound.  The patient  was extubated and brought to the recovery room in stable condition.  All  sponge, instrument, and needle counts were correct.      Wilmon Arms. Tsuei, M.D.  Electronically Signed  MKT/MEDQ  D:  09/05/2005  T:  09/05/2005  Job:  [id number]

## 2010-08-19 NOTE — Op Note (Signed)
[facility]  Patient:    Peggy Hicks, Peggy Hicks                        MRN: [id number] Proc. Date: 08/12/99 Adm. Date:  [id number] Disc. Date: [id number] Attending:  Abigail Miyamoto A                           Operative Report  PREOPERATIVE DIAGNOSIS:  Right breast mass.  POSTOPERATIVE DIAGNOSIS:  Right breast mass.  PROCEDURE:  Needle localized right breast biopsy.  SURGEON:  Douglas A. Magnus Ivan, M.D.  ANESTHESIA:  Local 1% lidocaine and monitored anesthesia care.  ESTIMATED BLOOD LOSS:  Minimal.  INDICATIONS:  Semaja Lymon is a 45 year old female, who, on routine mammogram was found to have a small mass in the right breast at the 4 oclock position, which was superficial, secondary to this and the inability to palpate the lesion, decision was made to proceed with needle localized biopsy.  PROCEDURE IN DETAIL:  The patient was brought to the operating room, identified as Molli Knock.  She was placed supine on the operating table and then anesthesia was induced.  Her right breast was then prepped and draped in usual sterile fashion.  Using 1% lidocaine, an area over the needle localization wire was anesthetized.  A transverse incision was made incorporating the localization wire.  The incision was carried down into the breast tissue with the scalpel.  Next, a lumpectomy was performed, removing all of the breast tissue surrounding the guidewire.  The entire breast specimen, including guidewire was then excised and sent to radiology to confirm removal.  Radiology confirmed that the suspicious mass was in the breast specimen.  Hemostasis was then achieved in the breast cavity.  The cavity was then irrigated.  Subcutaneous layer was then closed with interrupted 4-0 Vicryl sutures and skin was closed with a running 4-0 Monocryl.  Steri-Strips, gauze and Tegaderm were applied.  The patient tolerated the procedure well.  All sponge, needle and instrument  counts were correct at the end of the procedure.  The patient was then taken in a stable condition from the operating room to the recovery room. DD:  08/12/99 TD:  08/15/99 Job: [id number]

## 2010-09-07 ENCOUNTER — Telehealth: Payer: Self-pay | Admitting: *Deleted

## 2010-09-07 NOTE — Telephone Encounter (Signed)
[facility] order mailed to pt's home address as instructed. Copy of lab order sent to be scanned.

## 2010-09-07 NOTE — Telephone Encounter (Signed)
Order done for wellness labs - in IN box

## 2010-09-07 NOTE — Telephone Encounter (Signed)
Pt is coming in in August for a physical and she asks that an order for labs be mailed to her home address, works at [employer].

## 2010-09-15 ENCOUNTER — Ambulatory Visit (INDEPENDENT_AMBULATORY_CARE_PROVIDER_SITE_OTHER): Payer: BC Managed Care – PPO | Admitting: Women's Health

## 2010-09-15 DIAGNOSIS — B373 Candidiasis of vulva and vagina: Secondary | ICD-10-CM

## 2010-10-11 LAB — HM MAMMOGRAPHY: HM Mammogram: NEGATIVE

## 2010-10-26 ENCOUNTER — Telehealth: Payer: Self-pay | Admitting: *Deleted

## 2010-10-26 MED ORDER — ALPRAZOLAM 0.5 MG PO TABS: 0.5000 mg | ORAL_TABLET | Freq: Two times a day (BID) | ORAL | Status: DC | PRN

## 2010-10-26 NOTE — Telephone Encounter (Signed)
That is ok  Use with caution- will sedate/ no driving on it Px written for call in

## 2010-10-26 NOTE — Telephone Encounter (Signed)
Patient is asking if she could get a rx for alprazolam. She says that she is flying to [place] because her brother in law passed away. She says that you have done this for her in the past.

## 2010-10-26 NOTE — Telephone Encounter (Signed)
Patient notified as instructed by telephone.Medication phoned to [facility] as instructed.

## 2010-11-09 ENCOUNTER — Encounter: Payer: Self-pay | Admitting: Family Medicine

## 2010-11-10 ENCOUNTER — Encounter: Payer: Self-pay | Admitting: Family Medicine

## 2010-11-15 ENCOUNTER — Encounter: Payer: Self-pay | Admitting: Family Medicine

## 2010-11-15 ENCOUNTER — Ambulatory Visit (INDEPENDENT_AMBULATORY_CARE_PROVIDER_SITE_OTHER): Payer: BC Managed Care – PPO | Admitting: Family Medicine

## 2010-11-15 DIAGNOSIS — Z0000 Encounter for general adult medical examination without abnormal findings: Secondary | ICD-10-CM

## 2010-11-15 DIAGNOSIS — R7303 Prediabetes: Secondary | ICD-10-CM | POA: Insufficient documentation

## 2010-11-15 DIAGNOSIS — R7309 Other abnormal glucose: Secondary | ICD-10-CM

## 2010-11-15 DIAGNOSIS — R739 Hyperglycemia, unspecified: Secondary | ICD-10-CM

## 2010-11-15 DIAGNOSIS — F411 Generalized anxiety disorder: Secondary | ICD-10-CM

## 2010-11-15 NOTE — Assessment & Plan Note (Signed)
Reviewed health habits including diet and exercise and skin cancer prevention Also reviewed health mt list, fam hx and immunizations   Rev wellness labs from [facility] today Disc low glycemic diet  utd imms  Rev exercise plan

## 2010-11-15 NOTE — Assessment & Plan Note (Signed)
This is new and mild with fasting sugar of 112 and some family hx  Will work on low glycemic diet - disc in detail  No sweets and smaller amt of carbs (complex) Also exercise plan disc  Pt very motivated

## 2010-11-15 NOTE — Progress Notes (Signed)
Subjective:    Patient ID: Peggy Hicks, female    DOB: 05-25-1965, 45 y.o.   MRN: [id number]  HPI Here for annual health mt exam  Is doing well overall Working a lot  No new medical issues  Pt is 49- will be 48 soon   Wt is up ? 12 lb- bmi is 71  Sees gyn for exams pap was 2/12 nl Mam 7/12 - normal  Self exam no lumps or change  Gyn does a breast exam   Tdap 5/07 Usually gets flu shots    Labs at [facility]  Nl cbc - not anemic -- takes her vitamins more regularly- the gummies   Glucose 112 Mother was recently dx with DM  No thirst or excessive urination  She is a sweet eater -- can't shake them  Did well with exercise initially - then got out of the habit  Treadmill and also lunges and sit ups and 5 lb wts   Other labs all nl   Patient Active Problem List  Diagnoses  . ANXIETY  . TEMPOROMANDIBULAR JOINT DISORDER  . PRIMARY FOCAL HYPERHIDROSIS  . Routine general medical examination at a health care facility  . Hyperglycemia   Past Medical History  Diagnosis Date  . Anxiety   . Yeast vaginitis     Frequent  . Fibroids   . Back pain   . Temporomandibular joint disorder (TMJ)   . Hyperhidrosis   . Stress reaction    Past Surgical History  Procedure Date  . Breast surgery     Breast Reduction  . Myomectomy     Uterine fibroids removed   History  Substance Use Topics  . Smoking status: Never Smoker   . Smokeless tobacco: Not on file  . Alcohol Use: No   Family History  Problem Relation Age of Onset  . Hypertension Mother   . Heart disease Mother   . Diabetes Mother   . Cancer Maternal Aunt     Colon   No Known Allergies Current Outpatient Prescriptions on File Prior to Visit  Medication Sig Dispense Refill  . cyclobenzaprine (FLEXERIL) 10 MG tablet Take 10 mg by mouth 3 (three) times daily as needed.        . Multiple Vitamin (MULTIVITAMIN) tablet Take 1 tablet by mouth daily.        Marland Kitchen ALPRAZolam (XANAX) 0.5 MG tablet Take 1 tablet (0.5 mg  total) by mouth 2 (two) times daily as needed for anxiety (for traveling ).  10 tablet  0      Review of Systems Review of Systems  Constitutional: Negative for fever, appetite change, fatigue and unexpected weight change.  Eyes: Negative for pain and visual disturbance.  Respiratory: Negative for cough and shortness of breath.   Cardiovascular: Negative for cp or sob .   Gastrointestinal: Negative for nausea, diarrhea and constipation.  Genitourinary: Negative for urgency and frequency.  Skin: Negative for pallor.  Neurological: Negative for weakness, light-headedness, numbness and headaches.  Hematological: Negative for adenopathy. Does not bruise/bleed easily.  Psychiatric/Behavioral: Negative for dysphoric mood. The patient is not nervous/anxious.          Objective:   Physical Exam  Constitutional: She appears well-developed and well-nourished. No distress.       Mildly overwt and well appearing   HENT:  Head: Normocephalic and atraumatic.  Right Ear: External ear normal.  Left Ear: External ear normal.  Nose: Nose normal.  Mouth/Throat: Oropharynx is clear and moist.  Scant cerumen bilat   Eyes: Conjunctivae and EOM are normal. Pupils are equal, round, and reactive to light.  Neck: Normal range of motion. Neck supple. No JVD present. Carotid bruit is not present. No thyromegaly present.  Cardiovascular: Normal rate, regular rhythm, normal heart sounds and intact distal pulses.   Pulmonary/Chest: Effort normal and breath sounds normal. No respiratory distress. She has no wheezes.  Abdominal: Soft. Bowel sounds are normal. She exhibits no distension and no mass. There is no tenderness.  Musculoskeletal: Normal range of motion. She exhibits no edema and no tenderness.  Lymphadenopathy:    She has no cervical adenopathy.  Neurological: She is alert. She has normal reflexes. No cranial nerve deficit. Coordination normal.  Skin: Skin is warm and dry. No rash noted. No  pallor.  Psychiatric: She has a normal mood and affect.          Assessment & Plan:

## 2010-11-15 NOTE — Patient Instructions (Signed)
Work on lower sugar diet  Try to get rid of sweets - diet soda and diet drinks are ok  Keep fruit in moderation  Carbohydrates - higher fiber is better -- brown instead of white  Keep carbs to 2-3 oz per serving

## 2011-01-20 ENCOUNTER — Telehealth: Payer: Self-pay | Admitting: *Deleted

## 2011-01-20 MED ORDER — NONFORMULARY OR COMPOUNDED ITEM: Status: DC

## 2011-01-20 NOTE — Telephone Encounter (Signed)
Okay. Boric acid suppositories 600 mg #30 one per vagina 3 times weekly as needed for yeast refill x3.

## 2011-01-20 NOTE — Telephone Encounter (Signed)
Pt called stating her boric acid prescription has expired and would like a new Rx. Please advise.

## 2011-01-20 NOTE — Telephone Encounter (Deleted)
Okay. boric acid 600 mg suppository #30 one per vagina 3 times weekly as needed. Refill x3

## 2011-01-20 NOTE — Telephone Encounter (Signed)
Lm for pt that the below has been done.

## 2011-03-17 ENCOUNTER — Other Ambulatory Visit: Payer: Self-pay | Admitting: *Deleted

## 2011-03-17 MED ORDER — CYCLOBENZAPRINE HCL 10 MG PO TABS: 10.0000 mg | ORAL_TABLET | Freq: Three times a day (TID) | ORAL | Status: DC | PRN

## 2011-03-17 NOTE — Telephone Encounter (Signed)
Will refill electronically  

## 2011-03-21 ENCOUNTER — Ambulatory Visit (INDEPENDENT_AMBULATORY_CARE_PROVIDER_SITE_OTHER): Payer: BC Managed Care – PPO | Admitting: Gynecology

## 2011-03-21 ENCOUNTER — Ambulatory Visit: Payer: BC Managed Care – PPO | Admitting: Family Medicine

## 2011-03-21 ENCOUNTER — Encounter: Payer: Self-pay | Admitting: Gynecology

## 2011-03-21 DIAGNOSIS — D259 Leiomyoma of uterus, unspecified: Secondary | ICD-10-CM

## 2011-03-21 DIAGNOSIS — N390 Urinary tract infection, site not specified: Secondary | ICD-10-CM

## 2011-03-21 DIAGNOSIS — R300 Dysuria: Secondary | ICD-10-CM

## 2011-03-21 DIAGNOSIS — R82998 Other abnormal findings in urine: Secondary | ICD-10-CM

## 2011-03-21 MED ORDER — CIPROFLOXACIN HCL 250 MG PO TABS: 250.0000 mg | ORAL_TABLET | Freq: Two times a day (BID) | ORAL | Status: AC

## 2011-03-21 NOTE — Progress Notes (Signed)
Addended byCammie Mcgee T on: 03/21/2011 12:56 PM   Modules accepted: Orders

## 2011-03-21 NOTE — Patient Instructions (Signed)
Take ciprofloxacin 250 mg twice daily for 7 days follow up if symptoms persist or recur.

## 2011-03-21 NOTE — Progress Notes (Signed)
Patient presents with a several day history of low back pain suprapubic pressure and some dysuria. No fevers chills or other constitutional symptoms. Exam with chaperone new line spine straight no CVA tenderness Abdomen soft mild suprapubic tenderness no masses guarding rebound organomegaly Pelvic bimanual uterus bulky consistent with history of leiomyoma nontender adnexa without masses or tenderness  UA 3-5 WBC 0-2 RBC 2+ bacteria.  Assessment and plan: UTI will cover with ciprofloxacin 250 twice a day x7 days follow up if symptoms persist or recur.

## 2011-03-22 ENCOUNTER — Telehealth: Payer: Self-pay | Admitting: *Deleted

## 2011-03-22 NOTE — Telephone Encounter (Signed)
Pt called stating pharmacy never got rx for cipro 250 mg. rx called in to pharmacy,

## 2011-03-22 NOTE — Telephone Encounter (Signed)
Pt called back and stated after I called her Rx for cipro in, that she called that on call doctor last night and was given macrobid 100 mg to take. Pt informed to continue the medication, since she has started macrobid already.

## 2011-05-18 ENCOUNTER — Ambulatory Visit (INDEPENDENT_AMBULATORY_CARE_PROVIDER_SITE_OTHER): Payer: BC Managed Care – PPO | Admitting: Gynecology

## 2011-05-18 ENCOUNTER — Encounter: Payer: Self-pay | Admitting: Gynecology

## 2011-05-18 VITALS — BP 116/72 | Ht 66.25 in | Wt 176.0 lb

## 2011-05-18 DIAGNOSIS — D259 Leiomyoma of uterus, unspecified: Secondary | ICD-10-CM

## 2011-05-18 DIAGNOSIS — Z13220 Encounter for screening for lipoid disorders: Secondary | ICD-10-CM

## 2011-05-18 DIAGNOSIS — Z131 Encounter for screening for diabetes mellitus: Secondary | ICD-10-CM

## 2011-05-18 DIAGNOSIS — Z01419 Encounter for gynecological examination (general) (routine) without abnormal findings: Secondary | ICD-10-CM

## 2011-05-18 DIAGNOSIS — N9089 Other specified noninflammatory disorders of vulva and perineum: Secondary | ICD-10-CM

## 2011-05-18 LAB — URINALYSIS W MICROSCOPIC + REFLEX CULTURE
Bilirubin Urine: NEGATIVE
Glucose, UA: NEGATIVE mg/dL
Hgb urine dipstick: NEGATIVE
Ketones, ur: NEGATIVE mg/dL
Leukocytes, UA: NEGATIVE
Nitrite: NEGATIVE
Protein, ur: NEGATIVE mg/dL
Specific Gravity, Urine: 1.015 (ref 1.005–1.030)
Urobilinogen, UA: 0.2 mg/dL (ref 0.0–1.0)
pH: 5.5 (ref 5.0–8.0)

## 2011-05-18 NOTE — Progress Notes (Signed)
Peggy Hicks [birth year] [id number]        46 y.o.  for annual exam.  Several issues noted below.  Past medical history,surgical history, medications, allergies, family history and social history were all reviewed and documented in the EPIC chart. ROS:  Was performed and pertinent positives and negatives are included in the history.  Exam: Peggy Hicks chaperone present Filed Vitals:   05/18/11 1542  BP: 116/72   General appearance  Normal Skin grossly normal Head/Neck normal with no cervical or supraclavicular adenopathy thyroid normal Lungs  clear Cardiac RR, without RMG Abdominal  soft, nontender, without masses, organomegaly or hernia Breasts  examined lying and sitting without masses, retractions, discharge or axillary adenopathy. Pelvic  Ext/BUS/vagina  normal with skin tag lower aspect of her right labia minora  Cervix  normal    Uterus  Bulky irregular consistent with leiomyoma approximately 10 week size, midline nontender   Adnexa  Without masses or tenderness    Anus and perineum  normal   Rectovaginal  normal sphincter tone without palpated masses or tenderness.    Assessment/Plan:  46 y.o. female for annual exam.     1. Skin tag right labia. This is present before has not changed but the patient wants to have it excised.  The area was cleansed with Betadine, 1% lidocaine infiltration at the base and the skin tag was excised in its entirety at its insertion with the normal labia. Symmetric applied afterwards. Postoperative instructions given. Specimen sent to pathology. 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 Her uterus has remained unchanged in size over previous exams we'll plan expectant management at her choice. 3. Mammography. Patient had her mammogram in July continue with annual mammography. SBE monthly reviewed. 4. Pap smear. Her Pap smear was done today. She has no history of abnormal Pap smears before with numerous normal records in her chart, last Pap smear  February 2012. I reviewed current screening guidelines we'll plan on every 3 your Pap smear she's comfortable with that. 5. Health maintenance. She sees Dr. Milinda Antis routinely did ask that we check her baseline labs as she is fasting I ordered a CBC glucose lipid profile and urinalysis. Assuming she continues well for gynecologic standpoint and she will see [place] in a year, sooner as needed.    Dara Lords MD, 4:22 PM 05/18/2011

## 2011-05-18 NOTE — Patient Instructions (Signed)
Office will call you with pathology results from the biopsy within one week. Follow up with me in one year for annual gynecologic exam.

## 2011-05-25 ENCOUNTER — Telehealth: Payer: Self-pay | Admitting: Gynecology

## 2011-05-25 ENCOUNTER — Other Ambulatory Visit: Payer: Self-pay | Admitting: Gynecology

## 2011-05-25 DIAGNOSIS — Z13220 Encounter for screening for lipoid disorders: Secondary | ICD-10-CM

## 2011-05-25 DIAGNOSIS — Z01419 Encounter for gynecological examination (general) (routine) without abnormal findings: Secondary | ICD-10-CM

## 2011-05-25 DIAGNOSIS — Z131 Encounter for screening for diabetes mellitus: Secondary | ICD-10-CM

## 2011-05-25 NOTE — Telephone Encounter (Signed)
Spoke to patient with apologies that [facility] failed to pick up her lab specimens.  Peggy Hicks is not sure if she wants to come back to [facility] for the labs or go directly to a [facility] Lab  at this point. Peggy Hicks will contact [facility] and see what she will need as far as lab req from our office faxed to [facility] if she chooses to go there.

## 2011-08-01 ENCOUNTER — Ambulatory Visit (INDEPENDENT_AMBULATORY_CARE_PROVIDER_SITE_OTHER): Payer: 59 | Admitting: Gynecology

## 2011-08-01 ENCOUNTER — Other Ambulatory Visit (INDEPENDENT_AMBULATORY_CARE_PROVIDER_SITE_OTHER): Payer: 59 | Admitting: Gynecology

## 2011-08-01 ENCOUNTER — Encounter: Payer: Self-pay | Admitting: Gynecology

## 2011-08-01 DIAGNOSIS — N899 Noninflammatory disorder of vagina, unspecified: Secondary | ICD-10-CM

## 2011-08-01 DIAGNOSIS — R300 Dysuria: Secondary | ICD-10-CM

## 2011-08-01 DIAGNOSIS — N898 Other specified noninflammatory disorders of vagina: Secondary | ICD-10-CM

## 2011-08-01 DIAGNOSIS — Z131 Encounter for screening for diabetes mellitus: Secondary | ICD-10-CM

## 2011-08-01 DIAGNOSIS — Z13220 Encounter for screening for lipoid disorders: Secondary | ICD-10-CM

## 2011-08-01 DIAGNOSIS — Z01419 Encounter for gynecological examination (general) (routine) without abnormal findings: Secondary | ICD-10-CM

## 2011-08-01 DIAGNOSIS — R309 Painful micturition, unspecified: Secondary | ICD-10-CM

## 2011-08-01 LAB — URINALYSIS W MICROSCOPIC + REFLEX CULTURE
Bilirubin Urine: NEGATIVE
Glucose, UA: NEGATIVE mg/dL
Hgb urine dipstick: NEGATIVE
Ketones, ur: NEGATIVE mg/dL
Leukocytes, UA: NEGATIVE
Nitrite: NEGATIVE
Protein, ur: NEGATIVE mg/dL
Specific Gravity, Urine: 1.015 (ref 1.005–1.030)
Urobilinogen, UA: 0.2 mg/dL (ref 0.0–1.0)
pH: 7.5 (ref 5.0–8.0)

## 2011-08-01 LAB — WET PREP FOR TRICH, YEAST, CLUE
Clue Cells Wet Prep HPF POC: NONE SEEN
Trich, Wet Prep: NONE SEEN
WBC, Wet Prep HPF POC: NONE SEEN
Yeast Wet Prep HPF POC: NONE SEEN

## 2011-08-01 MED ORDER — FLUCONAZOLE 150 MG PO TABS: ORAL_TABLET | ORAL | Status: DC

## 2011-08-01 NOTE — Patient Instructions (Signed)
Take Diflucan 150 mg every other day for 3 doses. Follow up if symptoms persist or recur.

## 2011-08-01 NOTE — Progress Notes (Signed)
Patient presents "doesn't feel right down there". She feels a little stinging after urination. No real discharge/odor. No fever chills nausea vomiting diarrhea constipation.  Exam with Sherrilyn Rist chaperone present. Abdomen soft nontender without masses guarding rebound organomegaly. Pelvic external BUS vagina with white discharge, scant. Cervix normal. Uterus bulky irregular consistent with leiomyoma. Adnexa without masses or tenderness  Assessment and plan: Wet prep is negative. Clinically appears like yeast. UA is negative. We'll treat with Diflucan 150 every other day x3 doses. Follow up if symptoms persist or recur. Patient did ask me about her mother who is 37 who had an episode of vaginal bleeding. I discussed the possibilities of cancer and the need for her mother to be evaluated by a physician and strongly urged her to arrange this.

## 2011-08-04 ENCOUNTER — Telehealth: Payer: Self-pay | Admitting: *Deleted

## 2011-08-04 NOTE — Telephone Encounter (Signed)
Pt has questions about her AVS form, all question answered.

## 2011-08-07 ENCOUNTER — Other Ambulatory Visit: Payer: Self-pay

## 2011-08-07 NOTE — Telephone Encounter (Signed)
Pt left v/m requesting a refill on med sent to [facility] but did not leave name of medication. Left v/m for pt to call back. Pt last seen 11/15/10.

## 2011-08-08 MED ORDER — ALPRAZOLAM 0.5 MG PO TABS: 0.5000 mg | ORAL_TABLET | Freq: Two times a day (BID) | ORAL | Status: DC | PRN

## 2011-08-08 NOTE — Telephone Encounter (Signed)
Px written for call in   

## 2011-08-08 NOTE — Telephone Encounter (Signed)
[facility], patient notified via message left on cell phone voicemail.

## 2011-08-08 NOTE — Telephone Encounter (Signed)
Pt request anxiety med(pt could not remember name of med)alprazolam 0.5 mg  sent to [facility]. Pt last seen 11/15/10. Pt can be reached [phone]

## 2011-09-20 ENCOUNTER — Other Ambulatory Visit: Payer: Self-pay | Admitting: *Deleted

## 2011-09-20 MED ORDER — ALPRAZOLAM 0.5 MG PO TABS: 0.5000 mg | ORAL_TABLET | Freq: Two times a day (BID) | ORAL | Status: AC | PRN

## 2011-09-20 NOTE — Telephone Encounter (Signed)
Px written for call in   

## 2011-09-20 NOTE — Telephone Encounter (Signed)
Received faxed refill request from pharmacy. Patient has a CPE scheduled with you on 01/01/12. Is it okay to refill medication?

## 2011-09-20 NOTE — Telephone Encounter (Signed)
Rx Done . 

## 2011-10-10 ENCOUNTER — Other Ambulatory Visit: Payer: Self-pay | Admitting: Gynecology

## 2011-10-16 ENCOUNTER — Encounter: Payer: Self-pay | Admitting: Family Medicine

## 2011-10-16 ENCOUNTER — Ambulatory Visit (INDEPENDENT_AMBULATORY_CARE_PROVIDER_SITE_OTHER): Payer: 59 | Admitting: Family Medicine

## 2011-10-16 VITALS — BP 124/78 | HR 68 | Temp 98.0°F | Ht 66.0 in | Wt 174.2 lb

## 2011-10-16 DIAGNOSIS — N390 Urinary tract infection, site not specified: Secondary | ICD-10-CM | POA: Insufficient documentation

## 2011-10-16 LAB — POCT URINALYSIS DIPSTICK
Bilirubin, UA: NEGATIVE
Glucose, UA: NEGATIVE
Ketones, UA: NEGATIVE
Nitrite, UA: POSITIVE
Protein, UA: NEGATIVE
Spec Grav, UA: 1.01
Urobilinogen, UA: 0.2
pH, UA: 6.5

## 2011-10-16 MED ORDER — CIPROFLOXACIN HCL 250 MG PO TABS: 250.0000 mg | ORAL_TABLET | Freq: Two times a day (BID) | ORAL | Status: AC

## 2011-10-16 MED ORDER — FLUCONAZOLE 150 MG PO TABS: ORAL_TABLET | ORAL | Status: DC

## 2011-10-16 NOTE — Patient Instructions (Addendum)
Drink lots of water  Take the cipro as directed - start it now Use diflucan if needed for yeast  Update if not starting to improve in a week or if worsening   We will call you with culture result in 3-4 days

## 2011-10-16 NOTE — Progress Notes (Signed)
Subjective:    Patient ID: Peggy Hicks, female    DOB: 1966-02-18, 46 y.o.   MRN: [id number]  HPI Thinks she has a uti  Symptoms started yesterday  Burning to urinate and low back pain worse on the R side  Some frequency  Is trying to drink water and cranberry  Juice Urgency and then not much volume   Some nausea No fever   Patient Active Problem List  Diagnosis  . ANXIETY  . TEMPOROMANDIBULAR JOINT DISORDER  . PRIMARY FOCAL HYPERHIDROSIS  . Hyperglycemia  . UTI (lower urinary tract infection)   Past Medical History  Diagnosis Date  . Anxiety   . Yeast vaginitis     Frequent  . Fibroids   . Back pain   . Temporomandibular joint disorder (TMJ)   . Hyperhidrosis   . Stress reaction    Past Surgical History  Procedure Date  . Breast surgery     Breast Reduction  . Myomectomy     Uterine fibroids removed/ hysteroscopic submucous myomectomy  . Tubal ligation   . Breast biopsy   . Cesarean section      X 2   History  Substance Use Topics  . Smoking status: Never Smoker   . Smokeless tobacco: Never Used  . Alcohol Use: No   Family History  Problem Relation Age of Onset  . Hypertension Mother   . Heart disease Mother   . Diabetes Mother   . Cancer Maternal Aunt     liver   No Known Allergies Current Outpatient Prescriptions on File Prior to Visit  Medication Sig Dispense Refill  . ALPRAZolam (XANAX) 0.5 MG tablet Take 1 tablet (0.5 mg total) by mouth 2 (two) times daily as needed for anxiety (for traveling ).  10 tablet  0  . fluconazole (DIFLUCAN) 150 MG tablet Take one pill every other day for 3 doses  3 tablet  0  . Multiple Vitamin (MULTIVITAMIN) tablet Take 1 tablet by mouth daily.        . NONFORMULARY/COMPOUNDED ITEM Boric acid suppositories 600 mg #30 one per vagina 3 times weekly as needed for yeast refill x3  30 each  3  . cyclobenzaprine (FLEXERIL) 10 MG tablet Take 1 tablet (10 mg total) by mouth 3 (three) times daily as needed for muscle  spasms.  30 tablet  3      Review of Systems Review of Systems  Constitutional: Negative for fever, appetite change, fatigue and unexpected weight change.  Eyes: Negative for pain and visual disturbance.  Respiratory: Negative for cough and shortness of breath.   Cardiovascular: Negative for cp or palpitations    Gastrointestinal: Negative for nausea, diarrhea and constipation.  Genitourinary: Negative for urgency and frequency.  Skin: Negative for pallor or rash   Neurological: Negative for weakness, light-headedness, numbness and headaches.  Hematological: Negative for adenopathy. Does not bruise/bleed easily.  Psychiatric/Behavioral: Negative for dysphoric mood. The patient is not nervous/anxious.         Objective:   Physical Exam  Constitutional: She appears well-developed and well-nourished. No distress.  HENT:  Head: Normocephalic and atraumatic.  Eyes: Conjunctivae and EOM are normal. Pupils are equal, round, and reactive to light. No scleral icterus.  Neck: Normal range of motion. Neck supple. No JVD present. No thyromegaly present.  Abdominal: Soft. Bowel sounds are normal. She exhibits no distension and no abdominal bruit. There is tenderness. There is no rebound and no guarding.       Mild  suprapubic tenderness   Musculoskeletal: She exhibits no edema.       No cva tenderness  Lymphadenopathy:    She has no cervical adenopathy.  Neurological: She is alert. She has normal reflexes. No cranial nerve deficit. She exhibits normal muscle tone. Coordination normal.  Skin: Skin is warm and dry. No rash noted. No erythema. No pallor.  Psychiatric: She has a normal mood and affect.          Assessment & Plan:

## 2011-10-16 NOTE — Assessment & Plan Note (Signed)
Pos ua with dysuria and frequency and malaise tx with cipro  cx pending Update if not starting to improve in a week or if worsening   Disc imp of water intake

## 2011-10-18 LAB — URINE CULTURE: Colony Count: >=100000

## 2011-10-19 ENCOUNTER — Telehealth: Payer: Self-pay

## 2011-10-19 NOTE — Telephone Encounter (Signed)
Pt left v/m returning call to [facility]. Call pt [phone].

## 2011-10-19 NOTE — Telephone Encounter (Signed)
Returned patients phone call   

## 2011-10-31 ENCOUNTER — Encounter: Payer: Self-pay | Admitting: Family Medicine

## 2011-11-01 ENCOUNTER — Telehealth: Payer: Self-pay | Admitting: Family Medicine

## 2011-11-01 NOTE — Telephone Encounter (Signed)
Patient has an appointment for a cpx on 01/01/12.  Patient works at [employer] and she's requesting a lab order to be mailed to her.

## 2011-11-02 ENCOUNTER — Other Ambulatory Visit: Payer: Self-pay | Admitting: Gynecology

## 2011-11-02 ENCOUNTER — Other Ambulatory Visit: Payer: Self-pay | Admitting: *Deleted

## 2011-11-02 ENCOUNTER — Encounter: Payer: Self-pay | Admitting: Family Medicine

## 2011-11-02 DIAGNOSIS — R928 Other abnormal and inconclusive findings on diagnostic imaging of breast: Secondary | ICD-10-CM

## 2011-11-06 ENCOUNTER — Encounter: Payer: Self-pay | Admitting: *Deleted

## 2011-11-06 NOTE — Telephone Encounter (Signed)
Done and in IN box, thanks  

## 2011-11-06 NOTE — Telephone Encounter (Signed)
Mailed to patient as requested.

## 2011-11-13 ENCOUNTER — Encounter: Payer: Self-pay | Admitting: Family Medicine

## 2011-11-13 ENCOUNTER — Ambulatory Visit (INDEPENDENT_AMBULATORY_CARE_PROVIDER_SITE_OTHER): Payer: 59 | Admitting: Family Medicine

## 2011-11-13 VITALS — BP 118/62 | HR 76 | Temp 98.8°F | Ht 66.0 in | Wt 171.0 lb

## 2011-11-13 DIAGNOSIS — F430 Acute stress reaction: Secondary | ICD-10-CM | POA: Insufficient documentation

## 2011-11-13 DIAGNOSIS — R253 Fasciculation: Secondary | ICD-10-CM

## 2011-11-13 DIAGNOSIS — R259 Unspecified abnormal involuntary movements: Secondary | ICD-10-CM

## 2011-11-13 DIAGNOSIS — I499 Cardiac arrhythmia, unspecified: Secondary | ICD-10-CM

## 2011-11-13 DIAGNOSIS — F438 Other reactions to severe stress: Secondary | ICD-10-CM

## 2011-11-13 NOTE — Patient Instructions (Addendum)
I think you are having some muscle fasiculations (spasms) in chest wall - could be stress related Try a heating pad - on low setting  If worse or short of breath or other symptoms- let me know  We will do counseling referral at check out

## 2011-11-13 NOTE — Assessment & Plan Note (Signed)
Chest wall symptoms seem more muscular than cardiac related  EKG NSR with rate of 74, and some T wave inv in precordial leads  No exertional or other assoc symptoms  Disc use of relaxation tech and heat  Will update if worse or not improved

## 2011-11-13 NOTE — Assessment & Plan Note (Signed)
Disc stressors in detail and anxiety she is experiencing  Rev coping tech/ dx/ tx options  Will refer for counseling  Pt agrees that anx could worsen some physical symptoms

## 2011-11-13 NOTE — Progress Notes (Signed)
Subjective:    Patient ID: Peggy Hicks, female    DOB: 16-Dec-1965, 46 y.o.   MRN: [id number]  HPI Has been getting fluttering in her chest -mostly on L side  Going through a lot of stress  No sob , but a little "sore"- discomfort  The fluttering lasts 3-5 seconds at a time  No change with exertion  ? If worse with stress  No sob  No swollen ankles  No red or hot spots on her leg   No nausea or sweating   (but sweats when she gets nervous )    Stress- trying to do too much at home and work  Overwhelmed Not tearful Sleeping and eating ok   Patient Active Problem List  Diagnosis  . ANXIETY  . TEMPOROMANDIBULAR JOINT DISORDER  . PRIMARY FOCAL HYPERHIDROSIS  . Hyperglycemia  . UTI (lower urinary tract infection)  . Stress reaction  . Muscle fasciculation   Past Medical History  Diagnosis Date  . Anxiety   . Yeast vaginitis     Frequent  . Fibroids   . Back pain   . Temporomandibular joint disorder (TMJ)   . Hyperhidrosis   . Stress reaction    Past Surgical History  Procedure Date  . Breast surgery     Breast Reduction  . Myomectomy     Uterine fibroids removed/ hysteroscopic submucous myomectomy  . Tubal ligation   . Breast biopsy   . Cesarean section      X 2   History  Substance Use Topics  . Smoking status: Never Smoker   . Smokeless tobacco: Never Used  . Alcohol Use: No   Family History  Problem Relation Age of Onset  . Hypertension Mother   . Heart disease Mother   . Diabetes Mother   . Cancer Maternal Aunt     liver   No Known Allergies Current Outpatient Prescriptions on File Prior to Visit  Medication Sig Dispense Refill  . cyclobenzaprine (FLEXERIL) 10 MG tablet Take 1 tablet (10 mg total) by mouth 3 (three) times daily as needed for muscle spasms.  30 tablet  3  . fluconazole (DIFLUCAN) 150 MG tablet Take one pill every other day for 3 doses as needed for a yeast infection  3 tablet  0  . Multiple Vitamin (MULTIVITAMIN) tablet Take 1  tablet by mouth daily.        . NONFORMULARY/COMPOUNDED ITEM Boric acid suppositories 600 mg #30 one per vagina 3 times weekly as needed for yeast refill x3  30 each  3  . ALPRAZolam (XANAX) 0.5 MG tablet Take 1 tablet (0.5 mg total) by mouth 2 (two) times daily as needed for anxiety (for traveling ).  10 tablet  0     Review of Systems Review of Systems  Constitutional: Negative for fever, appetite change, fatigue and unexpected weight change. neg for diaphoresis  Eyes: Negative for pain and visual disturbance.  Respiratory: Negative for cough and shortness of breath.   Cardiovascular: Negative for cp or irregular Heart beat- pos for chest wall fluttering  Gastrointestinal: Negative for nausea, diarrhea and constipation. neg for vomitin Genitourinary: Negative for urgency and frequency.  Skin: Negative for pallor or rash   Neurological: Negative for weakness, light-headedness, numbness and headaches.  Hematological: Negative for adenopathy. Does not bruise/bleed easily.  Psychiatric/Behavioral: Negative for dysphoric mood. The patient is anxious and nervous from stress          Objective:   Physical Exam  Constitutional: She appears well-developed and well-nourished. No distress.  HENT:  Head: Normocephalic and atraumatic.  Mouth/Throat: Oropharynx is clear and moist.  Eyes: Conjunctivae and EOM are normal. Pupils are equal, round, and reactive to light. Right eye exhibits no discharge. Left eye exhibits no discharge.  Neck: Normal range of motion. Neck supple. No JVD present. Carotid bruit is not present. No thyromegaly present.  Cardiovascular: Normal rate, regular rhythm, normal heart sounds and intact distal pulses.  Exam reveals no gallop.   Pulmonary/Chest: Effort normal and breath sounds normal. No respiratory distress. She has no wheezes.  Abdominal: Soft. Bowel sounds are normal. She exhibits no distension and no abdominal bruit. There is no tenderness.  Musculoskeletal: She  exhibits no edema and no tenderness.       No leg pain/ swelling or warmth No palpable cord  Lymphadenopathy:    She has no cervical adenopathy.  Neurological: She is alert. She has normal reflexes. No cranial nerve deficit. She exhibits normal muscle tone. Coordination normal.  Skin: Skin is warm and dry. No rash noted. No erythema. No pallor.  Psychiatric: Her speech is normal and behavior is normal. Judgment and thought content normal. Her mood appears anxious. Cognition and memory are normal. She expresses no homicidal and no suicidal ideation.       Mildly anxious but pleasant Seems fatigued and stressed Candidly speaks of stressors           Assessment & Plan:

## 2011-12-06 ENCOUNTER — Encounter: Payer: Self-pay | Admitting: Family Medicine

## 2012-01-01 ENCOUNTER — Ambulatory Visit (INDEPENDENT_AMBULATORY_CARE_PROVIDER_SITE_OTHER): Payer: 59 | Admitting: Family Medicine

## 2012-01-01 ENCOUNTER — Encounter: Payer: Self-pay | Admitting: Family Medicine

## 2012-01-01 VITALS — BP 128/84 | HR 58 | Temp 99.0°F | Ht 66.25 in | Wt 175.0 lb

## 2012-01-01 DIAGNOSIS — R922 Inconclusive mammogram: Secondary | ICD-10-CM

## 2012-01-01 DIAGNOSIS — N6459 Other signs and symptoms in breast: Secondary | ICD-10-CM

## 2012-01-01 DIAGNOSIS — Z0000 Encounter for general adult medical examination without abnormal findings: Secondary | ICD-10-CM

## 2012-01-01 NOTE — Progress Notes (Signed)
Subjective:    Patient ID: Peggy Hicks, female    DOB: [birth year], 46 y.o.   MRN: [id number]  HPI Here for health maintenance exam and to review chronic medical problems    Muscle fasciculations are better  Overall feels good   Wt is up 4 lb with bmi of 28-- drank a lot of water yesterday   Pap 2/13- - sees Dr Audie Box  Normal   mammo 8/13 -wanted to re check area in 6 months (R sided dense area is being re imaged )  Self exam- no lumps or changes She will get a breast exam at gyn office in feb  Hyperglycemia - sugar 93 fasting  [facility] labs nl  Cholesterol HDL is 88 and LDL is 118  Overall labs look good   Flu shot -- gets it free at her job - will be soon    Patient Active Problem List  Diagnosis  . ANXIETY  . TEMPOROMANDIBULAR JOINT DISORDER  . PRIMARY FOCAL HYPERHIDROSIS  . Hyperglycemia  . UTI (lower urinary tract infection)  . Stress reaction  . Muscle fasciculation   Past Medical History  Diagnosis Date  . Anxiety   . Yeast vaginitis     Frequent  . Fibroids   . Back pain   . Temporomandibular joint disorder (TMJ)   . Hyperhidrosis   . Stress reaction    Past Surgical History  Procedure Date  . Breast surgery     Breast Reduction  . Myomectomy     Uterine fibroids removed/ hysteroscopic submucous myomectomy  . Tubal ligation   . Breast biopsy   . Cesarean section      X 2   History  Substance Use Topics  . Smoking status: Never Smoker   . Smokeless tobacco: Never Used  . Alcohol Use: No   Family History  Problem Relation Age of Onset  . Hypertension Mother   . Heart disease Mother   . Diabetes Mother   . Cancer Maternal Aunt     liver   No Known Allergies Current Outpatient Prescriptions on File Prior to Visit  Medication Sig Dispense Refill  . cyclobenzaprine (FLEXERIL) 10 MG tablet Take 1 tablet (10 mg total) by mouth 3 (three) times daily as needed for muscle spasms.  30 tablet  3  . Multiple Vitamin (MULTIVITAMIN) tablet  Take 1 tablet by mouth daily.        . NONFORMULARY/COMPOUNDED ITEM Boric acid suppositories 600 mg #30 one per vagina 3 times weekly as needed for yeast refill x3  30 each  3  . ALPRAZolam (XANAX) 0.5 MG tablet Take 1 tablet (0.5 mg total) by mouth 2 (two) times daily as needed for anxiety (for traveling ).  10 tablet  0  . fluconazole (DIFLUCAN) 150 MG tablet Take one pill every other day for 3 doses as needed for a yeast infection  3 tablet  0      Review of Systems Review of Systems  Constitutional: Negative for fever, appetite change, fatigue and unexpected weight change.  Eyes: Negative for pain and visual disturbance.  Respiratory: Negative for cough and shortness of breath.   Cardiovascular: Negative for cp or palpitations    Gastrointestinal: Negative for nausea, diarrhea and constipation.  Genitourinary: Negative for urgency and frequency.  Skin: Negative for pallor or rash   Neurological: Negative for weakness, light-headedness, numbness and headaches.  Hematological: Negative for adenopathy. Does not bruise/bleed easily.  Psychiatric/Behavioral: Negative for dysphoric mood. The patient  is not nervous/anxious.         Objective:   Physical Exam  Constitutional: She appears well-developed and well-nourished. No distress.  HENT:  Head: Normocephalic and atraumatic.  Right Ear: External ear normal.  Left Ear: External ear normal.  Nose: Nose normal.  Mouth/Throat: Oropharynx is clear and moist.  Eyes: Conjunctivae normal and EOM are normal. Pupils are equal, round, and reactive to light. Right eye exhibits no discharge. Left eye exhibits no discharge. No scleral icterus.  Neck: Normal range of motion. Neck supple. No JVD present. Carotid bruit is not present. No thyromegaly present.  Cardiovascular: Normal rate, regular rhythm, normal heart sounds and intact distal pulses.  Exam reveals no gallop.   Pulmonary/Chest: Effort normal and breath sounds normal. No respiratory  distress. She has no wheezes.  Abdominal: Soft. Bowel sounds are normal. She exhibits no distension, no abdominal bruit and no mass. There is no tenderness.  Musculoskeletal: Normal range of motion. She exhibits no edema and no tenderness.  Lymphadenopathy:    She has no cervical adenopathy.  Neurological: She is alert. She has normal reflexes. No cranial nerve deficit. She exhibits normal muscle tone. Coordination normal.  Skin: Skin is warm and dry. No rash noted. No erythema. No pallor.  Psychiatric: She has a normal mood and affect.       Affect is mildly blunted today          Assessment & Plan:

## 2012-01-01 NOTE — Patient Instructions (Addendum)
We will refer you for 6 month breast re imaging at check out Keep doing self breast exams Eat a healthy diet / exercise / and get enough rest  Don't forget to get your flu shot at work

## 2012-01-01 NOTE — Assessment & Plan Note (Signed)
Scheduled the recommended re check in feb for density (mam and [place]) Pt will have gyn visit at that time also

## 2012-01-01 NOTE — Assessment & Plan Note (Signed)
Reviewed health habits including diet and exercise and skin cancer prevention Also reviewed health mt list, fam hx and immunizations   Reviewed wellness labs from [facility] Rev low sugar/ chol diet

## 2012-05-21 ENCOUNTER — Encounter: Payer: Self-pay | Admitting: Family Medicine

## 2012-05-21 ENCOUNTER — Encounter: Payer: Self-pay | Admitting: Gynecology

## 2012-05-21 ENCOUNTER — Ambulatory Visit (INDEPENDENT_AMBULATORY_CARE_PROVIDER_SITE_OTHER): Payer: 59 | Admitting: Gynecology

## 2012-05-21 VITALS — BP 120/74 | Ht 66.0 in | Wt 174.0 lb

## 2012-05-21 DIAGNOSIS — Z13220 Encounter for screening for lipoid disorders: Secondary | ICD-10-CM

## 2012-05-21 DIAGNOSIS — Z01419 Encounter for gynecological examination (general) (routine) without abnormal findings: Secondary | ICD-10-CM

## 2012-05-21 LAB — CBC WITH DIFFERENTIAL/PLATELET
Basophils Absolute: 0 10*3/uL (ref 0.0–0.1)
Basophils Relative: 0 % (ref 0–1)
Eosinophils Absolute: 0.1 10*3/uL (ref 0.0–0.7)
Eosinophils Relative: 1 % (ref 0–5)
HCT: 41.1 % (ref 36.0–46.0)
Hemoglobin: 14.1 g/dL (ref 12.0–15.0)
Lymphocytes Relative: 47 % — ABNORMAL HIGH (ref 12–46)
Lymphs Abs: 3.3 10*3/uL (ref 0.7–4.0)
MCH: 29.5 pg (ref 26.0–34.0)
MCHC: 34.3 g/dL (ref 30.0–36.0)
MCV: 86 fL (ref 78.0–100.0)
Monocytes Absolute: 0.3 10*3/uL (ref 0.1–1.0)
Monocytes Relative: 5 % (ref 3–12)
Neutro Abs: 3.3 10*3/uL (ref 1.7–7.7)
Neutrophils Relative %: 47 % (ref 43–77)
Platelets: 326 10*3/uL (ref 150–400)
RBC: 4.78 MIL/uL (ref 3.87–5.11)
RDW: 13.4 % (ref 11.5–15.5)
WBC: 7 10*3/uL (ref 4.0–10.5)

## 2012-05-21 LAB — LIPID PANEL
Cholesterol: 229 mg/dL — ABNORMAL HIGH (ref 0–200)
HDL: 79 mg/dL (ref >39)
LDL Cholesterol: 123 mg/dL — ABNORMAL HIGH (ref 0–99)
Total CHOL/HDL Ratio: 2.9 Ratio
Triglycerides: 133 mg/dL (ref <150)
VLDL: 27 mg/dL (ref 0–40)

## 2012-05-21 LAB — COMPREHENSIVE METABOLIC PANEL
ALT: 15 U/L (ref 0–35)
AST: 22 U/L (ref 0–37)
Albumin: 4.5 g/dL (ref 3.5–5.2)
Alkaline Phosphatase: 60 U/L (ref 39–117)
BUN: 13 mg/dL (ref 6–23)
CO2: 28 mEq/L (ref 19–32)
Calcium: 9.7 mg/dL (ref 8.4–10.5)
Chloride: 100 mEq/L (ref 96–112)
Creat: 0.81 mg/dL (ref 0.50–1.10)
Glucose, Bld: 85 mg/dL (ref 70–99)
Potassium: 4.3 mEq/L (ref 3.5–5.3)
Sodium: 137 mEq/L (ref 135–145)
Total Bilirubin: 0.7 mg/dL (ref 0.3–1.2)
Total Protein: 7.2 g/dL (ref 6.0–8.3)

## 2012-05-21 NOTE — Progress Notes (Signed)
Peggy Hicks 03-Jun-1965 [id number]        47 y.o.  G2P2002 for annual exam.  Doing well without complaints. She does note monthly regular menses although her LMP is 05/07/2012 and she started to spot today.  Past medical history,surgical history, medications, allergies, family history and social history were all reviewed and documented in the EPIC chart. ROS:  Was performed and pertinent positives and negatives are included in the history.  Exam: [name] assistant Filed Vitals:   05/21/12 1358  BP: 120/74  Height: 5\' 6"  (1.676 m)  Weight: 174 lb (78.926 kg)   General appearance  Normal Skin grossly normal Head/Neck normal with no cervical or supraclavicular adenopathy thyroid normal Lungs  clear Cardiac RR, without RMG Abdominal  soft, nontender, without masses, organomegaly or hernia Breasts  examined lying and sitting without masses, retractions, discharge or axillary adenopathy.  Well-healed reduction scars bilaterally Pelvic  Ext/BUS/vagina  normal with mucoid bloody discharge  Cervix  normal   Uterus  Irregular 8 weeks' size consistent with leiomyoma midline mobile nontender.  Adnexa  Without masses or tenderness    Anus and perineum  normal   Rectovaginal  normal sphincter tone without palpated masses or tenderness.    Assessment/Plan:  47 y.o. [id number] female for annual exam.   1. Breakthrough bleeding this cycle. Regular menses previously. Will keep menstrual calendar as long as not recurrent them we'll monitor. It becomes recurrent then she knows to call for further evaluation. 2. Leiomyoma. 8 weeks' size, stable on exam. We'll continue to monitor. 3. Mammography 11/2011.  Had follow up views of her right breast this morning. Results pending. Assuming normal will follow up per their recommendation. SBE monthly reviewed. 4. Pap smear 05/2010. No Pap smear done today. No history of abnormal Pap smears. Plan repeat next year at 3 year interval. 5. Health maintenance. Is not sure  when her last routine lab work was done through her primary's office. She asked if I could do it today as she is fasting. We'll check baseline CBC comprehensive metabolic panel lipid profile urinalysis.  Follow up one year, sooner as needed.    Dara Lords MD, 2:23 PM 05/21/2012

## 2012-05-21 NOTE — Patient Instructions (Signed)
Follow up for lab results on My Chart If irregular bleeding follow up.  If regular then follow up in one year

## 2012-05-22 ENCOUNTER — Other Ambulatory Visit: Payer: Self-pay | Admitting: Gynecology

## 2012-05-22 ENCOUNTER — Telehealth: Payer: Self-pay

## 2012-05-22 ENCOUNTER — Telehealth: Payer: Self-pay | Admitting: Gynecology

## 2012-05-22 ENCOUNTER — Encounter: Payer: Self-pay | Admitting: Gynecology

## 2012-05-22 DIAGNOSIS — R809 Proteinuria, unspecified: Secondary | ICD-10-CM

## 2012-05-22 DIAGNOSIS — E785 Hyperlipidemia, unspecified: Secondary | ICD-10-CM

## 2012-05-22 LAB — URINALYSIS W MICROSCOPIC + REFLEX CULTURE
Bilirubin Urine: NEGATIVE
Casts: NONE SEEN
Crystals: NONE SEEN
Glucose, UA: NEGATIVE mg/dL
Hgb urine dipstick: NEGATIVE
Ketones, ur: NEGATIVE mg/dL
Leukocytes, UA: NEGATIVE
Nitrite: NEGATIVE
Protein, ur: 30 mg/dL — AB
Specific Gravity, Urine: 1.022 (ref 1.005–1.030)
Urobilinogen, UA: 0.2 mg/dL (ref 0.0–1.0)
pH: 6 (ref 5.0–8.0)

## 2012-05-22 NOTE — Telephone Encounter (Signed)
Patient had sent email in the patient notification (My Chart email) regarding this.  I emailed her back and gave her Dr. Kristie Cowman recommendation and rec she can call to schedule appt.  And to call me if any questions.

## 2012-05-22 NOTE — Telephone Encounter (Signed)
Left message to call.

## 2012-05-22 NOTE — Telephone Encounter (Signed)
I called Peggy Hicks earlier this morning regarding proteinuria and Dr. Velvet Bathe recommended recheck u/a.  Peggy Hicks called back to say that she didn't mention it to you yesterday at exam but she has been having some bloated feeling in her abd and also pain in her back on right side. Upon questioning her about how long she has had symptoms she explains she has been sore from weight lifting last week and today all that soreness is gone and she realizes that her right side in back is still hurting.  The bloated feeling she says is more when she empties her bladder she still has a full feeling and she has noticed that today.  She knows she needs to return for u/a.  Should I have her schedule another office visit for back pain?

## 2012-05-22 NOTE — Telephone Encounter (Signed)
yes

## 2012-05-23 ENCOUNTER — Encounter: Payer: Self-pay | Admitting: Gynecology

## 2012-05-23 ENCOUNTER — Ambulatory Visit (INDEPENDENT_AMBULATORY_CARE_PROVIDER_SITE_OTHER): Payer: 59 | Admitting: Gynecology

## 2012-05-23 DIAGNOSIS — R140 Abdominal distension (gaseous): Secondary | ICD-10-CM

## 2012-05-23 DIAGNOSIS — R809 Proteinuria, unspecified: Secondary | ICD-10-CM

## 2012-05-23 DIAGNOSIS — D259 Leiomyoma of uterus, unspecified: Secondary | ICD-10-CM

## 2012-05-23 DIAGNOSIS — R141 Gas pain: Secondary | ICD-10-CM

## 2012-05-23 DIAGNOSIS — N949 Unspecified condition associated with female genital organs and menstrual cycle: Secondary | ICD-10-CM

## 2012-05-23 DIAGNOSIS — N925 Other specified irregular menstruation: Secondary | ICD-10-CM

## 2012-05-23 DIAGNOSIS — N938 Other specified abnormal uterine and vaginal bleeding: Secondary | ICD-10-CM

## 2012-05-23 LAB — URINALYSIS W MICROSCOPIC + REFLEX CULTURE
Bilirubin Urine: NEGATIVE
Glucose, UA: NEGATIVE mg/dL
Hgb urine dipstick: NEGATIVE
Ketones, ur: NEGATIVE mg/dL
Leukocytes, UA: NEGATIVE
Nitrite: NEGATIVE
Protein, ur: NEGATIVE mg/dL
Specific Gravity, Urine: 1.01 (ref 1.005–1.030)
Urobilinogen, UA: 0.2 mg/dL (ref 0.0–1.0)
pH: 7 (ref 5.0–8.0)

## 2012-05-23 LAB — FOLLICLE STIMULATING HORMONE: FSH: 20.2 m[IU]/mL

## 2012-05-23 LAB — PROLACTIN: Prolactin: 5.3 ng/mL

## 2012-05-23 LAB — TSH: TSH: 1.225 u[IU]/mL (ref 0.350–4.500)

## 2012-05-23 NOTE — Patient Instructions (Signed)
Follow up for ultrasound as scheduled 

## 2012-05-23 NOTE — Progress Notes (Signed)
Patient presents just having been seen for her annual exam. She forgot to mention that she is feeling a lot of bloating in her lower pelvis and some low back pain. She had just started to spot before her menses was due which is unusual for her. No other changes such as skin hair weight.  Exam with kim assistant Spine straight without CVA tenderness. Abdomen soft nontender without masses guarding rebound organomegaly. Pelvic external BUS vagina normal. Cervix normal. Uterus bulky 8 weeks' size irregular nontender. Adnexa without gross masses or tenderness.  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 Some low back pain also. Urinalysis was negative at her visit other than mild protein and we went ahead and repeated that today. Recommend starting with ultrasound to reassess pelvis rule out ovarian process. Given the single episode of DUB have recommended to go ahead and do a sonohysterogram now because my fear would be if not then she would continue to have irregular bleeding and we would end up having to do this in the future. 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 Also check baseline FSH TSH prolactin due to the DUB pattern. Patient will follow up for blood results and ultrasound and then we'll go from there.

## 2012-06-05 ENCOUNTER — Encounter: Payer: Self-pay | Admitting: Family Medicine

## 2012-06-19 ENCOUNTER — Encounter: Payer: Self-pay | Admitting: Family Medicine

## 2012-06-19 ENCOUNTER — Ambulatory Visit: Payer: 59 | Admitting: Gynecology

## 2012-06-19 ENCOUNTER — Other Ambulatory Visit: Payer: 59

## 2012-06-19 ENCOUNTER — Ambulatory Visit (INDEPENDENT_AMBULATORY_CARE_PROVIDER_SITE_OTHER): Payer: 59 | Admitting: Family Medicine

## 2012-06-19 VITALS — BP 110/72 | HR 70 | Temp 98.9°F | Ht 66.0 in | Wt 175.8 lb

## 2012-06-19 DIAGNOSIS — M549 Dorsalgia, unspecified: Secondary | ICD-10-CM

## 2012-06-19 MED ORDER — TIZANIDINE HCL 4 MG PO TABS: 4.0000 mg | ORAL_TABLET | Freq: Every evening | ORAL | Status: DC

## 2012-06-19 NOTE — Progress Notes (Signed)
    Patient Name: Peggy Hicks Date of Birth: [birth year] Medical Record Number: [id number] Gender: female  PCP: Roxy Manns, MD  History of Present Illness:  Peggy Hicks is a 47 y.o. very pleasant female patient who presents with the following: Back Pain  ongoing for approximately: 2 weeks The patient has had back pain before - accident a few years ago. The back pain is localized into the lumbar spine area. They also describe no radiculopathy. Low back, more on the L side. Doing a little better. Flipped her mom's mattress.   No numbness or tingling. No bowel or bladder incontinence. No focal weakness. Prior interventions: none Physical therapy: No Chiropractic manipulations: No Acupuncture: No Osteopathic manipulation: No Heat or cold: Minimal effect  Past Medical History, Surgical History, Family History, Medications, Allergies have been reviewed and updated if relevant.  Review of Systems  GEN: No fevers, chills. Nontoxic. Primarily MSK c/o today. MSK: Detailed in the HPI GI: tolerating PO intake without difficulty Neuro: As above  Otherwise the pertinent positives of the ROS are noted above.    Physical Exam  Filed Vitals:   06/19/12 0818  BP: 110/72  Pulse: 70  Temp: 98.9 F (37.2 C)  TempSrc: Oral  Height: 5\' 6"  (1.676 m)  Weight: 175 lb 12 oz (79.72 kg)  SpO2: 98%    Gen: Well-developed,well-nourished,in no acute distress; alert,appropriate and cooperative throughout examination HEENT: Normocephalic and atraumatic without obvious abnormalities.  Ears, externally no deformities Pulm: Breathing comfortably in no respiratory distress Range of motion at  the waist: Flexion, rotation and lateral bending: full  No echymosis or edema Rises to examination table with no difficulty Gait: minimally antalgic  Inspection/Deformity: No abnormality Paraspinus T:  L SI joint, mild at L5-s1  B Ankle Dorsiflexion (L5,4): 5/5 B Great Toe Dorsiflexion (L5,4):  5/5 Heel Walk (L5): WNL Toe Walk (S1): WNL Rise/Squat (L4): WNL, mild pain  SENSORY B Medial Foot (L4): WNL B Dorsum (L5): WNL B Lateral (S1): WNL Light Touch: WNL Pinprick: WNL  REFLEXES Knee (L4): 2+ Ankle (S1): 2+  B SLR, seated: neg B SLR, supine: neg B FABER: neg B Reverse FABER: neg B Greater Troch: NT B Log Roll: neg B Stork: NT B Sciatic Notch: NT   Assessment and Plan:  Back pain  I reviewed with the patient the structures involved and how they related to diagnosis.   Conservative algorithms for acute back pain generally begin with the following: NSAIDS Muscle Relaxants Mild pain medication  Start with medications, core rehab, and progress from there following low back pain algorithm. No red flags are present.   Alleve 2 tabs by mouth two times a day over the counter: Take at least for 2 - 3 weeks. This is equal to a prescripton strength dose (GENERIC CHEAPER EQUIVALENT IS NAPROXEN SODIUM)   Meds ordered this encounter  Medications  . tiZANidine (ZANAFLEX) 4 MG tablet    Sig: Take 1 tablet (4 mg total) by mouth Nightly.    Dispense:  30 tablet    Refill:  2

## 2012-07-10 ENCOUNTER — Other Ambulatory Visit: Payer: Self-pay | Admitting: Gynecology

## 2012-07-10 DIAGNOSIS — D259 Leiomyoma of uterus, unspecified: Secondary | ICD-10-CM

## 2012-07-15 ENCOUNTER — Telehealth: Payer: Self-pay | Admitting: *Deleted

## 2012-07-15 ENCOUNTER — Other Ambulatory Visit: Payer: Self-pay | Admitting: Gynecology

## 2012-07-15 MED ORDER — NONFORMULARY OR COMPOUNDED ITEM: Status: DC

## 2012-07-15 NOTE — Telephone Encounter (Signed)
Message copied by Aura Camps on Mon Jul 15, 2012  3:12 PM ------      Message from: Dara Lords      Created: Mon Jul 15, 2012 12:24 PM       Patient needs prescription called to her pharmacy for boric acid suppositories 600 mg #60 one per vagina up to 3 times weekly as needed for yeast infection. Refill x1 ------

## 2012-07-15 NOTE — Telephone Encounter (Signed)
rx called into pharmacy

## 2012-07-17 ENCOUNTER — Other Ambulatory Visit: Payer: Self-pay

## 2012-07-17 ENCOUNTER — Ambulatory Visit: Payer: Self-pay | Admitting: Gynecology

## 2012-07-31 ENCOUNTER — Ambulatory Visit (INDEPENDENT_AMBULATORY_CARE_PROVIDER_SITE_OTHER): Payer: 59 | Admitting: Family Medicine

## 2012-07-31 ENCOUNTER — Encounter: Payer: Self-pay | Admitting: Family Medicine

## 2012-07-31 VITALS — BP 128/86 | HR 66 | Temp 98.5°F | Ht 66.0 in | Wt 178.0 lb

## 2012-07-31 DIAGNOSIS — L293 Anogenital pruritus, unspecified: Secondary | ICD-10-CM

## 2012-07-31 DIAGNOSIS — Z113 Encounter for screening for infections with a predominantly sexual mode of transmission: Secondary | ICD-10-CM | POA: Insufficient documentation

## 2012-07-31 DIAGNOSIS — N898 Other specified noninflammatory disorders of vagina: Secondary | ICD-10-CM

## 2012-07-31 DIAGNOSIS — R350 Frequency of micturition: Secondary | ICD-10-CM | POA: Insufficient documentation

## 2012-07-31 DIAGNOSIS — R32 Unspecified urinary incontinence: Secondary | ICD-10-CM

## 2012-07-31 LAB — POCT URINALYSIS DIPSTICK
Bilirubin, UA: NEGATIVE
Blood, UA: NEGATIVE
Glucose, UA: NEGATIVE
Ketones, UA: NEGATIVE
Leukocytes, UA: NEGATIVE
Nitrite, UA: NEGATIVE
Spec Grav, UA: 1.01
Urobilinogen, UA: 0.2
pH, UA: 7

## 2012-07-31 LAB — POCT WET PREP (WET MOUNT): KOH Wet Prep POC: NEGATIVE

## 2012-07-31 MED ORDER — FLUCONAZOLE 150 MG PO TABS: ORAL_TABLET | ORAL | Status: DC

## 2012-07-31 NOTE — Patient Instructions (Addendum)
Take diflucan as directed for yeast infection Continue good water intake We will call you about other results

## 2012-07-31 NOTE — Progress Notes (Signed)
Subjective:    Patient ID: Peggy Hicks, female    DOB: [birth year], 47 y.o.   MRN: [id number]  HPI Here for urinary symptoms - about a week  Feels the urge to go- frequently and then not a lot of urine to give  Drinks at least 4 (24 oz) bottles of water and 2 c of green tea per day  No burning to urinate- just feels a little funny  Slight itch vaginally  No discharge  No pelvic pain   No fever or nausea   ua neg today  When asked about poss STD- unlikely/ monogamous but wants to be tested  Patient Active Problem List   Diagnosis Date Noted  . Breast density 01/01/2012  . Routine general medical examination at a health care facility 01/01/2012  . Stress reaction 11/13/2011  . Muscle fasciculation 11/13/2011  . Hyperglycemia 11/15/2010  . PRIMARY FOCAL HYPERHIDROSIS 10/22/2007  . TEMPOROMANDIBULAR JOINT DISORDER 01/25/2007  . ANXIETY 08/01/2006   Past Medical History  Diagnosis Date  . Anxiety   . Fibroids   . Back pain   . Temporomandibular joint disorder (TMJ)   . Hyperhidrosis   . Stress reaction    Past Surgical History  Procedure Laterality Date  . Breast surgery      Breast Reduction  . Myomectomy      Uterine fibroids removed/ hysteroscopic submucous myomectomy  . Tubal ligation    . Breast biopsy    . Cesarean section       X 2  . Hysteroscopy     History  Substance Use Topics  . Smoking status: Never Smoker   . Smokeless tobacco: Never Used  . Alcohol Use: No   Family History  Problem Relation Age of Onset  . Hypertension Mother   . Heart disease Mother   . Diabetes Mother   . Cancer Maternal Aunt     liver   No Known Allergies Current Outpatient Prescriptions on File Prior to Visit  Medication Sig Dispense Refill  . Multiple Vitamin (MULTIVITAMIN) tablet Take 1 tablet by mouth daily.        . NONFORMULARY OR COMPOUNDED ITEM Boric acid suppositories 600 mg #30 one per vagina 3 times weekly as needed for yeast refill x3  60 each  1  .  tiZANidine (ZANAFLEX) 4 MG tablet Take 1 tablet (4 mg total) by mouth Nightly.  30 tablet  2  . ALPRAZolam (XANAX) 0.5 MG tablet Take 1 tablet (0.5 mg total) by mouth 2 (two) times daily as needed for anxiety (for traveling ).  10 tablet  0   No current facility-administered medications on file prior to visit.    Review of Systems Review of Systems  Constitutional: Negative for fever, appetite change, fatigue and unexpected weight change.  Eyes: Negative for pain and visual disturbance.  Respiratory: Negative for cough and shortness of breath.   Cardiovascular: Negative for cp or palpitations    Gastrointestinal: Negative for nausea, diarrhea and constipation.  Genitourinary: pos for urgency and frequency. ps for vag itching/ neg for pain  Skin: Negative for pallor or rash   Neurological: Negative for weakness, light-headedness, numbness and headaches.  Hematological: Negative for adenopathy. Does not bruise/bleed easily.  Psychiatric/Behavioral: Negative for dysphoric mood. The patient is not nervous/anxious.         Objective:   Physical Exam  Constitutional: She appears well-developed and well-nourished. No distress.  HENT:  Head: Normocephalic and atraumatic.  Mouth/Throat: Oropharynx is clear and moist.  Eyes: Conjunctivae and EOM are normal. Pupils are equal, round, and reactive to light.  Neck: Normal range of motion. Neck supple.  Cardiovascular: Normal rate and regular rhythm.   Pulmonary/Chest: Effort normal and breath sounds normal.  Abdominal: Soft. Bowel sounds are normal. She exhibits no distension and no mass. There is no tenderness.  No suprapubic tenderness or fullness   No cva tenderness   Genitourinary: No erythema or bleeding around the vagina. Vaginal discharge found.  Scant vaginal discharge-while  No lesions noted   Lymphadenopathy:    She has no cervical adenopathy.  Neurological: She is alert. She has normal reflexes.  Skin: Skin is warm and dry. No  rash noted. No erythema. No pallor.  Psychiatric: She has a normal mood and affect.          Assessment & Plan:

## 2012-07-31 NOTE — Assessment & Plan Note (Signed)
With hyphae on wet prep tx for yeast vaginitis Diflucan 150now and repeat in 3 d Pt is prone to these Update if no imp

## 2012-07-31 NOTE — Assessment & Plan Note (Signed)
Swabs send for GC and chlam Not high risk

## 2012-07-31 NOTE — Assessment & Plan Note (Signed)
Neg ua - suspect this is from yeast vaginitis

## 2012-08-01 LAB — POCT UA - MICROSCOPIC ONLY
Bacteria, U Microscopic: 0
Casts, Ur, LPF, POC: 0
Mucus, UA: 0
RBC, urine, microscopic: 0
Yeast, UA: 0

## 2012-08-01 LAB — GC/CHLAMYDIA PROBE AMP
CT Probe RNA: NEGATIVE
GC Probe RNA: NEGATIVE

## 2012-09-19 ENCOUNTER — Telehealth: Payer: Self-pay

## 2012-09-19 ENCOUNTER — Encounter: Payer: Self-pay | Admitting: Family Medicine

## 2012-09-19 NOTE — Telephone Encounter (Signed)
Pt left v/m requesting med called in to [facility] for UTI; pt sent message via mychart also; Dr Milinda Antis advised pt to call office for appt. Left v/m for pt to cb.

## 2012-09-20 ENCOUNTER — Encounter: Payer: Self-pay | Admitting: Family Medicine

## 2012-09-20 ENCOUNTER — Ambulatory Visit (INDEPENDENT_AMBULATORY_CARE_PROVIDER_SITE_OTHER): Payer: 59 | Admitting: Family Medicine

## 2012-09-20 VITALS — BP 110/70 | HR 72 | Temp 98.5°F | Wt 176.8 lb

## 2012-09-20 DIAGNOSIS — R300 Dysuria: Secondary | ICD-10-CM

## 2012-09-20 DIAGNOSIS — N390 Urinary tract infection, site not specified: Secondary | ICD-10-CM

## 2012-09-20 LAB — POCT UA - MICROSCOPIC ONLY

## 2012-09-20 LAB — POCT URINALYSIS DIPSTICK
Bilirubin, UA: NEGATIVE
Glucose, UA: NEGATIVE
Ketones, UA: NEGATIVE
Nitrite, UA: NEGATIVE
Spec Grav, UA: 1.015
Urobilinogen, UA: 0.2
pH, UA: 6.5

## 2012-09-20 MED ORDER — CIPROFLOXACIN HCL 250 MG PO TABS: 250.0000 mg | ORAL_TABLET | Freq: Two times a day (BID) | ORAL | Status: DC

## 2012-09-20 NOTE — Assessment & Plan Note (Signed)
Routine uncomplicated. Will treat with 3 days of cipro.  Send for culture and sensitivities. Push fluids.

## 2012-09-20 NOTE — Progress Notes (Signed)
  Subjective:    Patient ID: Peggy Hicks, female    DOB: 01/23/1966, 47 y.o.   MRN: [id number]  Urinary Tract Infection  This is a new problem. The current episode started yesterday. The problem occurs every urination. The problem has been gradually worsening. The quality of the pain is described as burning. The pain is moderate. There has been no fever. She is sexually active. There is no history of pyelonephritis. Associated symptoms include urgency. Pertinent negatives include no chills, flank pain, frequency, hematuria, hesitancy, nausea, possible pregnancy or vomiting. Associated symptoms comments: Amount urinating is not much as she is drinking. She has tried increased fluids for the symptoms. The treatment provided moderate relief. There is no history of catheterization, kidney stones, recurrent UTIs, a single kidney, urinary stasis or a urological procedure.      Review of Systems  Constitutional: Negative for chills.  Gastrointestinal: Negative for nausea and vomiting.  Genitourinary: Positive for urgency. Negative for hesitancy, frequency, hematuria and flank pain.       Objective:   Physical Exam  Constitutional: Vital signs are normal. She appears well-developed and well-nourished. She is cooperative.  Non-toxic appearance. She does not appear ill. No distress.  HENT:  Head: Normocephalic.  Right Ear: Hearing, tympanic membrane, external ear and ear canal normal. Tympanic membrane is not erythematous, not retracted and not bulging.  Left Ear: Hearing, tympanic membrane, external ear and ear canal normal. Tympanic membrane is not erythematous, not retracted and not bulging.  Nose: No mucosal edema or rhinorrhea. Right sinus exhibits no maxillary sinus tenderness and no frontal sinus tenderness. Left sinus exhibits no maxillary sinus tenderness and no frontal sinus tenderness.  Mouth/Throat: Uvula is midline, oropharynx is clear and moist and mucous membranes are normal.  Eyes:  Conjunctivae, EOM and lids are normal. Pupils are equal, round, and reactive to light. No foreign bodies found.  Neck: Trachea normal and normal range of motion. Neck supple. Carotid bruit is not present. No mass and no thyromegaly present.  Cardiovascular: Normal rate, regular rhythm, S1 normal, S2 normal, normal heart sounds, intact distal pulses and normal pulses.  Exam reveals no gallop and no friction rub.   No murmur heard. Pulmonary/Chest: Effort normal and breath sounds normal. Not tachypneic. No respiratory distress. She has no decreased breath sounds. She has no wheezes. She has no rhonchi. She has no rales.  Abdominal: Soft. Normal appearance and bowel sounds are normal. There is no tenderness. There is no CVA tenderness.  Neurological: She is alert.  Skin: Skin is warm, dry and intact. No rash noted.  Psychiatric: Her speech is normal and behavior is normal. Judgment and thought content normal. Her mood appears not anxious. Cognition and memory are normal. She does not exhibit a depressed mood.          Assessment & Plan:

## 2012-09-20 NOTE — Patient Instructions (Addendum)
Push fluids.  Hold tizanadine while on antibiotics.  Start cipro twice daily x 3 days.  Call if symptoms not improved at end of treatment course.  We will call you with culture results.

## 2012-09-22 LAB — URINE CULTURE: Colony Count: 75000

## 2012-09-25 NOTE — Telephone Encounter (Signed)
Pt seen in office 09/20/12.

## 2012-10-02 ENCOUNTER — Encounter: Payer: Self-pay | Admitting: Gynecology

## 2012-10-07 ENCOUNTER — Encounter: Payer: Self-pay | Admitting: Gynecology

## 2012-10-07 ENCOUNTER — Ambulatory Visit (INDEPENDENT_AMBULATORY_CARE_PROVIDER_SITE_OTHER): Payer: 59 | Admitting: Gynecology

## 2012-10-07 DIAGNOSIS — N926 Irregular menstruation, unspecified: Secondary | ICD-10-CM

## 2012-10-07 DIAGNOSIS — D251 Intramural leiomyoma of uterus: Secondary | ICD-10-CM

## 2012-10-07 DIAGNOSIS — M549 Dorsalgia, unspecified: Secondary | ICD-10-CM

## 2012-10-07 NOTE — Progress Notes (Signed)
The patient presents complaining that her last menses was just light staining and not a real flow. Was seen in February with complaints of bloating and premenstrual spotting. Had ordered TSH FSH prolactin and sonohysterogram which she never followed up for her.  Exam with Blanca Asst. Abdomen soft nontender without masses guarding rebound organomegaly. Pelvic external BUS vagina normal. Cervix normal. Uterus 8 weeks irregular consistent with leiomyoma. Adnexa without gross masses or tenderness.  Assessment and plan: Menstrual irregularity, leiomyoma. Again recommend TSH FSH prolactin and sonohysterogram. Patient agrees to schedule and followup for these.

## 2012-10-07 NOTE — Patient Instructions (Signed)
Followup for blood results and ultrasound as scheduled.

## 2012-10-08 ENCOUNTER — Encounter: Payer: Self-pay | Admitting: Gynecology

## 2012-10-14 ENCOUNTER — Other Ambulatory Visit: Payer: Self-pay | Admitting: Gynecology

## 2012-10-14 DIAGNOSIS — D259 Leiomyoma of uterus, unspecified: Secondary | ICD-10-CM

## 2012-10-14 DIAGNOSIS — N926 Irregular menstruation, unspecified: Secondary | ICD-10-CM

## 2012-10-21 ENCOUNTER — Ambulatory Visit (INDEPENDENT_AMBULATORY_CARE_PROVIDER_SITE_OTHER): Payer: 59

## 2012-10-21 ENCOUNTER — Encounter: Payer: Self-pay | Admitting: Gynecology

## 2012-10-21 ENCOUNTER — Ambulatory Visit (INDEPENDENT_AMBULATORY_CARE_PROVIDER_SITE_OTHER): Payer: 59 | Admitting: Gynecology

## 2012-10-21 DIAGNOSIS — N926 Irregular menstruation, unspecified: Secondary | ICD-10-CM

## 2012-10-21 DIAGNOSIS — D251 Intramural leiomyoma of uterus: Secondary | ICD-10-CM

## 2012-10-21 DIAGNOSIS — N921 Excessive and frequent menstruation with irregular cycle: Secondary | ICD-10-CM

## 2012-10-21 DIAGNOSIS — N852 Hypertrophy of uterus: Secondary | ICD-10-CM

## 2012-10-21 DIAGNOSIS — D259 Leiomyoma of uterus, unspecified: Secondary | ICD-10-CM

## 2012-10-21 NOTE — Patient Instructions (Signed)
Office will call you with biopsy results 

## 2012-10-21 NOTE — Progress Notes (Signed)
Patient presents for sonohysterogram do to feelings of bloating, history of leiomyoma and several episodes of irregular menses. Her recent lab studies to include TSH FSH prolactin were all normal.  Ultrasound shows uterus enlarged with multiple myomas the largest measuring 43 mm. Endometrial echo 5.6 mm. Right and left ovaries visualized and normal. Cul-de-sac negative.  Sonohysterogram was performed, sterile technique, easy catheter introduction, good distention with no endometrial abnormalities. Endometrial sample was taken. Patient tolerated well.  Assessment and plan: Leiomyoma, several episodes of irregular bleeding but overall having regular monthly menses. FSH TSH prolactin normal. We'll followup the biopsy results. Assuming normal in plan expectant management with menstrual calendar and she'll report any significant irregularity. Otherwise regular monthly menses Will monitor. Otherwise she will call.

## 2012-10-22 NOTE — Addendum Note (Signed)
Addended by: Dayna Barker on: 10/22/2012 02:31 PM   Modules accepted: Orders

## 2012-10-25 ENCOUNTER — Encounter: Payer: Self-pay | Admitting: Gynecology

## 2012-11-13 ENCOUNTER — Other Ambulatory Visit: Payer: Self-pay | Admitting: Family Medicine

## 2012-11-13 ENCOUNTER — Encounter: Payer: Self-pay | Admitting: Family Medicine

## 2012-11-13 NOTE — Telephone Encounter (Signed)
We have given her this in the past for travel but it has not been the dissolvable pill - ? Does she need the dissolvable one for some reason?

## 2012-11-13 NOTE — Telephone Encounter (Signed)
Ok to refill 

## 2012-11-14 MED ORDER — ALPRAZOLAM 0.5 MG PO TABS: 0.5000 mg | ORAL_TABLET | Freq: Two times a day (BID) | ORAL | Status: DC | PRN

## 2012-11-14 NOTE — Telephone Encounter (Signed)
Left voicemail requesting pt to call office 

## 2012-11-14 NOTE — Telephone Encounter (Signed)
Received fax from pharmacy and they are not requesting the dissolvable tab just the regular xanax, med changed, please advise

## 2012-11-14 NOTE — Telephone Encounter (Signed)
That's what I thought Px written for call in

## 2012-11-15 NOTE — Telephone Encounter (Signed)
Rx called in as prescribed 

## 2012-12-03 ENCOUNTER — Ambulatory Visit (INDEPENDENT_AMBULATORY_CARE_PROVIDER_SITE_OTHER): Payer: 59 | Admitting: Family Medicine

## 2012-12-03 ENCOUNTER — Encounter: Payer: Self-pay | Admitting: Family Medicine

## 2012-12-03 VITALS — BP 122/72 | HR 63 | Temp 99.2°F | Ht 66.0 in | Wt 171.5 lb

## 2012-12-03 DIAGNOSIS — N390 Urinary tract infection, site not specified: Secondary | ICD-10-CM

## 2012-12-03 DIAGNOSIS — R300 Dysuria: Secondary | ICD-10-CM

## 2012-12-03 LAB — POCT URINALYSIS DIPSTICK
Bilirubin, UA: NEGATIVE
Glucose, UA: NEGATIVE
Ketones, UA: NEGATIVE
Nitrite, UA: NEGATIVE
Protein, UA: NEGATIVE
Spec Grav, UA: 1.01
Urobilinogen, UA: 0.2
pH, UA: 6.5

## 2012-12-03 LAB — POCT UA - MICROSCOPIC ONLY

## 2012-12-03 MED ORDER — CIPROFLOXACIN HCL 250 MG PO TABS: 250.0000 mg | ORAL_TABLET | Freq: Two times a day (BID) | ORAL | Status: DC

## 2012-12-03 NOTE — Assessment & Plan Note (Signed)
Uncomplicated and symptomatic  Some imp with fluid intake Cover with cipro 250 bid for 5d  Update if not starting to improve in a week or if worsening  Pend urine cx

## 2012-12-03 NOTE — Progress Notes (Signed)
Subjective:    Patient ID: Peggy Hicks, female    DOB: Oct 08, 1965, 47 y.o.   MRN: [id number]  HPI Here for urinary symptoms Started symptoms on Saturday Had burning to urinate and pain afterwards (lots of cranberry juice and water)- this helped a bit  Strong/ bad urine odor No blood in urine Bladder area is a bit sore   Some low back pain -not in flank  Some tailbone area pain  Low grade fever  No n/v    UA shows mod leukocytes and mod blood  Patient Active Problem List   Diagnosis Date Noted  . Breast density 01/01/2012  . Routine general medical examination at a health care facility 01/01/2012  . Stress reaction 11/13/2011  . Muscle fasciculation 11/13/2011  . Hyperglycemia 11/15/2010  . PRIMARY FOCAL HYPERHIDROSIS 10/22/2007  . TEMPOROMANDIBULAR JOINT DISORDER 01/25/2007  . ANXIETY 08/01/2006   Past Medical History  Diagnosis Date  . Anxiety   . Fibroids   . Back pain   . Temporomandibular joint disorder (TMJ)   . Hyperhidrosis   . Stress reaction    Past Surgical History  Procedure Laterality Date  . Breast surgery      Breast Reduction  . Myomectomy      Uterine fibroids removed/ hysteroscopic submucous myomectomy  . Tubal ligation    . Breast biopsy    . Cesarean section       X 2  . Hysteroscopy     History  Substance Use Topics  . Smoking status: Never Smoker   . Smokeless tobacco: Never Used  . Alcohol Use: No   Family History  Problem Relation Age of Onset  . Hypertension Mother   . Heart disease Mother   . Diabetes Mother   . Cancer Maternal Aunt     liver   No Known Allergies Current Outpatient Prescriptions on File Prior to Visit  Medication Sig Dispense Refill  . ALPRAZolam (XANAX) 0.5 MG tablet Take 1 tablet (0.5 mg total) by mouth 2 (two) times daily as needed (anxiety for traveling).  30 tablet  0  . Multiple Vitamin (MULTIVITAMIN) tablet Take 1 tablet by mouth daily.        . NONFORMULARY OR COMPOUNDED ITEM Boric acid  suppositories 600 mg #30 one per vagina 3 times weekly as needed for yeast refill x3  60 each  1  . tiZANidine (ZANAFLEX) 4 MG tablet Take 4 mg by mouth as needed.       No current facility-administered medications on file prior to visit.      Review of Systems Review of Systems  Constitutional: Negative for fever, appetite change, fatigue and unexpected weight change.  Eyes: Negative for pain and visual disturbance.  Respiratory: Negative for cough and shortness of breath.   Cardiovascular: Negative for cp or palpitations    Gastrointestinal: Negative for nausea, diarrhea and constipation.  Genitourinary: pos for urgency and frequency. neg for blood in urine or vaginal d/c Skin: Negative for pallor or rash   Neurological: Negative for weakness, light-headedness, numbness and headaches.  Hematological: Negative for adenopathy. Does not bruise/bleed easily.  Psychiatric/Behavioral: Negative for dysphoric mood. The patient is not nervous/anxious.         Objective:   Physical Exam  Constitutional: She appears well-developed and well-nourished. No distress.  HENT:  Head: Normocephalic and atraumatic.  Mouth/Throat: Oropharynx is clear and moist.  Eyes: Conjunctivae and EOM are normal. Pupils are equal, round, and reactive to light. No scleral icterus.  Neck: Normal range of motion. Neck supple.  Cardiovascular: Normal rate and regular rhythm.   Abdominal: Soft. Bowel sounds are normal. She exhibits no mass. There is no hepatosplenomegaly. There is tenderness in the suprapubic area. There is no rebound, no guarding and no CVA tenderness.  Mild suprapubic tenderness  Lymphadenopathy:    She has no cervical adenopathy.  Neurological: She is alert.  Skin: Skin is warm and dry. No rash noted.  Psychiatric: She has a normal mood and affect.          Assessment & Plan:

## 2012-12-03 NOTE — Patient Instructions (Addendum)
Drink lots of water Take the cipro as directed  Update if not starting to improve in a week or if worsening   We will get in touch regarding urine culture

## 2012-12-05 LAB — URINE CULTURE: Colony Count: >=100000

## 2012-12-10 ENCOUNTER — Ambulatory Visit (INDEPENDENT_AMBULATORY_CARE_PROVIDER_SITE_OTHER): Payer: 59 | Admitting: Family Medicine

## 2012-12-10 ENCOUNTER — Encounter: Payer: Self-pay | Admitting: *Deleted

## 2012-12-10 ENCOUNTER — Encounter: Payer: Self-pay | Admitting: Family Medicine

## 2012-12-10 ENCOUNTER — Ambulatory Visit (INDEPENDENT_AMBULATORY_CARE_PROVIDER_SITE_OTHER)
Admission: RE | Admit: 2012-12-10 | Discharge: 2012-12-10 | Disposition: A | Discharge status: Home / Self Care | Payer: 59 | Source: Ambulatory Visit | Attending: Family Medicine | Admitting: Family Medicine

## 2012-12-10 VITALS — BP 118/68 | HR 61 | Temp 98.4°F | Ht 66.0 in | Wt 175.0 lb

## 2012-12-10 DIAGNOSIS — M5416 Radiculopathy, lumbar region: Secondary | ICD-10-CM | POA: Insufficient documentation

## 2012-12-10 DIAGNOSIS — M545 Low back pain, unspecified: Secondary | ICD-10-CM

## 2012-12-10 LAB — POCT URINALYSIS DIPSTICK
Bilirubin, UA: NEGATIVE
Blood, UA: NEGATIVE
Glucose, UA: NEGATIVE
Ketones, UA: NEGATIVE
Leukocytes, UA: NEGATIVE
Nitrite, UA: NEGATIVE
Spec Grav, UA: 1.01
Urobilinogen, UA: 0.2
pH, UA: 7.5

## 2012-12-10 MED ORDER — MELOXICAM 15 MG PO TABS: 15.0000 mg | ORAL_TABLET | Freq: Every day | ORAL | Status: DC

## 2012-12-10 NOTE — Patient Instructions (Addendum)
Take the meloxicam (instead of aleve) with food one daily as needed for back pain  Continue muscle relaxer at night if need Keep trying heat and also stretching and walking  Xray now - we will call you with a result

## 2012-12-10 NOTE — Progress Notes (Signed)
Subjective:    Patient ID: Peggy Hicks, female    DOB: Mar 10, 1966, 47 y.o.   MRN: [id number]  HPI Here for low back pain - traveling to both sides of the back  Worse with certain movements - ext back and standing up straight  Gets better when she walks  Going on for about a year - and recently worse  Larey Seat out of bed a year ago and landed on tailbone   Using aleve-not much relief  Heat helps a bit   Rad to both legs  No numbness or weakness in legs or feet and no foot drop  Has tried zanaflex - ? If it helps/ just puts her to sleep    No fam hx   Patient Active Problem List   Diagnosis Date Noted  . UTI (urinary tract infection) 12/03/2012  . Breast density 01/01/2012  . Routine general medical examination at a health care facility 01/01/2012  . Stress reaction 11/13/2011  . Muscle fasciculation 11/13/2011  . Hyperglycemia 11/15/2010  . PRIMARY FOCAL HYPERHIDROSIS 10/22/2007  . TEMPOROMANDIBULAR JOINT DISORDER 01/25/2007  . ANXIETY 08/01/2006   Past Medical History  Diagnosis Date  . Anxiety   . Fibroids   . Back pain   . Temporomandibular joint disorder (TMJ)   . Hyperhidrosis   . Stress reaction    Past Surgical History  Procedure Laterality Date  . Breast surgery      Breast Reduction  . Myomectomy      Uterine fibroids removed/ hysteroscopic submucous myomectomy  . Tubal ligation    . Breast biopsy    . Cesarean section       X 2  . Hysteroscopy     History  Substance Use Topics  . Smoking status: Never Smoker   . Smokeless tobacco: Never Used  . Alcohol Use: No   Family History  Problem Relation Age of Onset  . Hypertension Mother   . Heart disease Mother   . Diabetes Mother   . Cancer Maternal Aunt     liver   No Known Allergies Current Outpatient Prescriptions on File Prior to Visit  Medication Sig Dispense Refill  . ALPRAZolam (XANAX) 0.5 MG tablet Take 1 tablet (0.5 mg total) by mouth 2 (two) times daily as needed (anxiety for  traveling).  30 tablet  0  . Multiple Vitamin (MULTIVITAMIN) tablet Take 1 tablet by mouth daily.        . NONFORMULARY OR COMPOUNDED ITEM Boric acid suppositories 600 mg #30 one per vagina 3 times weekly as needed for yeast refill x3  60 each  1  . tiZANidine (ZANAFLEX) 4 MG tablet Take 4 mg by mouth as needed.       No current facility-administered medications on file prior to visit.      Review of Systems Review of Systems  Constitutional: Negative for fever, appetite change, fatigue and unexpected weight change.  Eyes: Negative for pain and visual disturbance.  Respiratory: Negative for cough and shortness of breath.   Cardiovascular: Negative for cp or palpitations    Gastrointestinal: Negative for nausea, diarrhea and constipation.  Genitourinary: Negative for urgency and frequency. neg for dysuria or flank pain or hematuria MSK pos for back pain / neg for joint swelling or pain  Skin: Negative for pallor or rash   Neurological: Negative for weakness, light-headedness, numbness and headaches.  Hematological: Negative for adenopathy. Does not bruise/bleed easily.  Psychiatric/Behavioral: Negative for dysphoric mood. The patient is not nervous/anxious.  Objective:   Physical Exam  Constitutional: She appears well-developed and well-nourished. No distress.  Eyes: Conjunctivae and EOM are normal. Pupils are equal, round, and reactive to light.  Neck: Normal range of motion. Neck supple.  Cardiovascular: Normal rate and regular rhythm.   Musculoskeletal: She exhibits tenderness. She exhibits no edema.       Lumbar back: She exhibits decreased range of motion, tenderness, bony tenderness and spasm. She exhibits no swelling and no deformity.  Tender over L4-S1 Tender over bilat para lumbar musculature Neg SLR No neuro changes Nl gait  Flex 90 deg and ext 10 deg Lat bend is painful bilat  Neurological: She is alert. She has normal strength and normal reflexes. She  displays no atrophy. No sensory deficit. She exhibits normal muscle tone.  Skin: Skin is warm and dry. No rash noted. No erythema. No pallor.  Psychiatric: She has a normal mood and affect.          Assessment & Plan:

## 2012-12-10 NOTE — Assessment & Plan Note (Signed)
Initial encounter-though it has gone on for 1 year after a fall  Recently worsening and rad to both legs/ no neuro signs on exam Xray today  Trial of meloxicam  Muscle relaxer in pm  Then will update and make plan

## 2012-12-11 ENCOUNTER — Telehealth: Payer: Self-pay | Admitting: Family Medicine

## 2012-12-11 ENCOUNTER — Encounter: Payer: Self-pay | Admitting: Family Medicine

## 2012-12-11 DIAGNOSIS — M545 Low back pain, unspecified: Secondary | ICD-10-CM

## 2012-12-11 NOTE — Telephone Encounter (Signed)
Ortho ref

## 2012-12-19 ENCOUNTER — Telehealth: Payer: Self-pay | Admitting: Family Medicine

## 2012-12-19 NOTE — Telephone Encounter (Signed)
Patient wants to cancel the Referral to Ortho. Referral cancelled. MK

## 2012-12-30 ENCOUNTER — Encounter: Payer: Self-pay | Admitting: Family Medicine

## 2013-01-17 ENCOUNTER — Ambulatory Visit (INDEPENDENT_AMBULATORY_CARE_PROVIDER_SITE_OTHER): Payer: 59 | Admitting: Family Medicine

## 2013-01-17 ENCOUNTER — Encounter: Payer: Self-pay | Admitting: Family Medicine

## 2013-01-17 ENCOUNTER — Telehealth: Payer: Self-pay | Admitting: Family Medicine

## 2013-01-17 VITALS — BP 134/82 | HR 84 | Temp 98.1°F | Wt 173.0 lb

## 2013-01-17 DIAGNOSIS — M5416 Radiculopathy, lumbar region: Secondary | ICD-10-CM

## 2013-01-17 DIAGNOSIS — IMO0002 Reserved for concepts with insufficient information to code with codable children: Secondary | ICD-10-CM

## 2013-01-17 MED ORDER — ACETAMINOPHEN 160 MG/5ML PO SUSP
1000.0000 mg | Freq: Once | ORAL | Status: AC
  Administered 2013-01-17: 1000 mg via ORAL

## 2013-01-17 MED ORDER — PREDNISONE 20 MG PO TABS: ORAL_TABLET | ORAL | Status: DC

## 2013-01-17 NOTE — Telephone Encounter (Signed)
Patient Information:  Caller Name: Caedyn  Phone: [phone]  Patient: Peggy Hicks, Peggy Hicks  Gender: Female  DOB: 1965/08/20  Age: 47 Years  PCP: [facility] [employer] [facility])  Pregnant: No  Office Follow Up:  Does the office need to follow up with this patient?: No  Instructions For The Office: N/A   Symptoms  Reason For Call & Symptoms: Has had back pain intermittently for a  long time - has gotten worse for the last week, noting  pain in Left hip radiates down Left leg to toes. At tomes feeling like cramp / charlie horse in calf.  Reviewed Health History In EMR: Yes  Reviewed Medications In EMR: Yes  Reviewed Allergies In EMR: Yes  Reviewed Surgeries / Procedures: Yes  Date of Onset of Symptoms: 01/10/2013  Treatments Tried: Meloxicam, muscle relaxer  Treatments Tried Worked: No OB / GYN:  LMP: 01/10/2013  Guideline(s) Used:  Back Pain  Disposition Per Guideline:   See Today in Office  Reason For Disposition Reached:   Tingling or numbness in the legs or feet  Advice Given:  Reassurance:  Twisting or heavy lifting can cause back pain.  Cold or Heat:  Heat Pack: If pain lasts over 2 days, apply heat to the sore area. Use a heat pack, heating pad, or warm wet washcloth. Do this for 10 minutes, then as needed. For widespread stiffness, take a hot bath or hot shower instead. Move the sore area under the warm water.  Sleep:  Sleep on your side with a pillow between your knees. If you sleep on your back, put a pillow under your knees.  Avoid sleeping on your stomach.  Your mattress should be firm. Avoid waterbeds.  Activity  Avoid anything that makes your pain worse. Avoid heavy lifting, twisting, and too much exercise until your back heals.  Call Back If:  You become worse.  Patient Will Follow Care Advice:  YES  Appointment Scheduled:  01/17/2013 14:15:00 Appointment Scheduled Provider:  Eustaquio Boyden [facility])

## 2013-01-17 NOTE — Progress Notes (Signed)
  Subjective:    Patient ID: Peggy Hicks, female    DOB: 07-Apr-1965, 47 y.o.   MRN: [id number]  HPI CC: lower back pain  See prior note for details.  Saw PCP last month with lower back pain, some radiation to legs.  Treated with meloxicam (taking with food) as well as zanaflex (sedating).    Initially meloxicam helped (not taking regularly), but this week symptoms started worsening again.  Describes worsening back pain with radiation down left buttock to foot.  Some cramping of leg.  Now with left leg numbness from lateral thigh to foot.  Denies fevers/chills, bowel/bladder accidents, leg weakness, saddle anesthesia. Denies inciting trauma/injury to leg.  LUMBAR SPINE - COMPLETE 4+ VIEW (12/10/2012) Comparison: None.  Findings: There are five lumbar-type non-rib bearing vertebral bodies. SI joints appear intact. Pelvic phleboliths are seen. No pars defects are seen. There is slight narrowing of intervertebral disc space at L5-S1 level. Minimal multilevel marginal osteophyte formation is seen representing degenerative spondylosis. No fracture bony destruction is seen. No subluxation or dislocation is evident.  IMPRESSION:  Changes of degenerative spondylosis. Slight narrowing of L5-S1 intervertebral disc space.  Flu shot today.  Past Medical History  Diagnosis Date  . Anxiety   . Fibroids   . Back pain   . Temporomandibular joint disorder (TMJ)   . Hyperhidrosis   . Stress reaction      Review of Systems Per HPI    Objective:   Physical Exam  Nursing note and vitals reviewed. Constitutional: She appears well-developed and well-nourished. No distress.  Musculoskeletal: She exhibits no edema.  Mildly tender to palpation midline lower lumbar region.  Mild tenderness to palpation lumbar paraspinous mm Neg SLR bilaterally Neg FABER No pain with int/ ext rotation at hip No pain at SIJ or GTB or sciatic notch bilaterally  Neurological: She has normal strength. No sensory deficit.   Reflex Scores:      Patellar reflexes are 2+ on the right side and 2+ on the left side.      Achilles reflexes are 2+ on the right side and 2+ on the left side. No sensory deficit appreciated Able to heel and toe walk 5/5 strength BLE.  Skin: Skin is warm and dry.      Assessment & Plan:

## 2013-01-17 NOTE — Addendum Note (Signed)
Addended by: Josph Macho A on: 01/17/2013 03:24 PM   Modules accepted: Orders

## 2013-01-17 NOTE — Patient Instructions (Addendum)
Flu shot today I do think you likely have a pinched nerve in the spine.   Let's do short course of prednisone to decrease inflammation.   Don't take with meloxicam.  Tylenol is ok. Restart exercising. Given longterm nature of this pain, I do agree with spine clinic referral - let us know how you feel after the prednisone course - we can place referral then.

## 2013-01-17 NOTE — Assessment & Plan Note (Signed)
Describes L lumbar radiculopathy but no loss of function, no decreased sensation appreciated today. Discussed options - pt opts to try prednisone course (discussed avoiding other NSAIDs). Pt will call us with result of prednisone course and then decide if desires spine clinic referral given chronicity of complaint. Recommended exercise  Discussed red flags including loss of function or strength to seek urgent care.

## 2013-01-20 ENCOUNTER — Ambulatory Visit: Payer: 59 | Admitting: Family Medicine

## 2013-01-27 ENCOUNTER — Ambulatory Visit (INDEPENDENT_AMBULATORY_CARE_PROVIDER_SITE_OTHER): Payer: 59 | Admitting: Family Medicine

## 2013-01-27 ENCOUNTER — Encounter: Payer: Self-pay | Admitting: Family Medicine

## 2013-01-27 VITALS — BP 126/84 | HR 77 | Temp 98.8°F | Ht 66.0 in | Wt 167.8 lb

## 2013-01-27 DIAGNOSIS — R739 Hyperglycemia, unspecified: Secondary | ICD-10-CM

## 2013-01-27 DIAGNOSIS — Z0000 Encounter for general adult medical examination without abnormal findings: Secondary | ICD-10-CM

## 2013-01-27 DIAGNOSIS — N760 Acute vaginitis: Secondary | ICD-10-CM

## 2013-01-27 DIAGNOSIS — R7309 Other abnormal glucose: Secondary | ICD-10-CM

## 2013-01-27 LAB — POCT WET PREP (WET MOUNT)

## 2013-01-27 NOTE — Assessment & Plan Note (Signed)
Expect glucose to be up due to recent prednisone

## 2013-01-27 NOTE — Assessment & Plan Note (Signed)
Pt recently on prednisone Hyphae on wet prep tx with gyn by boric acid in the past  She will start back on that and update gyn if no improvement

## 2013-01-27 NOTE — Progress Notes (Signed)
Subjective:    Patient ID: Peggy Hicks, female    DOB: 10/18/1965, 47 y.o.   MRN: [id number]  HPI Here for health maintenance exam and to review chronic medical problems    Has been seen for back pain -- finishing prednisone  She fell backwards on a slippery floor on Friday -and fell on butt and hit her head Did not go to the hosp  Put ice on head No concussion symptoms at all   Back is better overall and leg no longer feels numb   Wt is down 6 lb with bmi of 27  Flu vaccine -got that at her last visit   Mammogram 7/14  Self exam no lumps or changes   Pap 2/13- when she goes to see Dr Audie Box  Using boric acid -not feeling quite right   (every other day)  Some vaginal pain  Some vaginal discharge- clear mucous and some white d/c  No odor  Sees Dr Audie Box  Had [place] for fibroids at last visit with him  On prednisone - so yeast is possible  Irregular bleeding occasionally Suspects is in peri menopause - few hot flashes   Td 5/07 vacine   Lab Results  Component Value Date   WBC 7.0 05/21/2012   HGB 14.1 05/21/2012   HCT 41.1 05/21/2012   MCV 86.0 05/21/2012   PLT 326 05/21/2012      Chemistry      Component Value Date/Time   NA 137 05/21/2012 1432   K 4.3 05/21/2012 1432   CL 100 05/21/2012 1432   CO2 28 05/21/2012 1432   BUN 13 05/21/2012 1432   CREATININE 0.81 05/21/2012 1432   CREATININE 0.8 10/22/2007 1121      Component Value Date/Time   CALCIUM 9.7 05/21/2012 1432   ALKPHOS 60 05/21/2012 1432   AST 22 05/21/2012 1432   ALT 15 05/21/2012 1432   BILITOT 0.7 05/21/2012 1432      Lab Results  Component Value Date   CHOL 229* 05/21/2012   HDL 79 05/21/2012   LDLCALC 123* 05/21/2012   TRIG 133 05/21/2012   CHOLHDL 2.9 05/21/2012    Lab Results  Component Value Date   TSH 1.225 05/23/2012       Patient Active Problem List   Diagnosis Date Noted  . Left lumbar radiculopathy 12/10/2012  . UTI (urinary tract infection) 12/03/2012  . Breast density 01/01/2012  .  Routine general medical examination at a health care facility 01/01/2012  . Stress reaction 11/13/2011  . Muscle fasciculation 11/13/2011  . Hyperglycemia 11/15/2010  . PRIMARY FOCAL HYPERHIDROSIS 10/22/2007  . TEMPOROMANDIBULAR JOINT DISORDER 01/25/2007  . ANXIETY 08/01/2006   Past Medical History  Diagnosis Date  . Anxiety   . Fibroids   . Back pain   . Temporomandibular joint disorder (TMJ)   . Hyperhidrosis   . Stress reaction    Past Surgical History  Procedure Laterality Date  . Breast surgery      Breast Reduction  . Myomectomy      Uterine fibroids removed/ hysteroscopic submucous myomectomy  . Tubal ligation    . Breast biopsy    . Cesarean section       X 2  . Hysteroscopy     History  Substance Use Topics  . Smoking status: Never Smoker   . Smokeless tobacco: Never Used  . Alcohol Use: No   Family History  Problem Relation Age of Onset  . Hypertension Mother   . Heart  disease Mother   . Diabetes Mother   . Cancer Maternal Aunt     liver   No Known Allergies Current Outpatient Prescriptions on File Prior to Visit  Medication Sig Dispense Refill  . ALPRAZolam (XANAX) 0.5 MG tablet Take 1 tablet (0.5 mg total) by mouth 2 (two) times daily as needed (anxiety for traveling).  30 tablet  0  . meloxicam (MOBIC) 15 MG tablet Take 1 tablet (15 mg total) by mouth daily. With food  30 tablet  0  . Multiple Vitamin (MULTIVITAMIN) tablet Take 1 tablet by mouth daily.        . NONFORMULARY OR COMPOUNDED ITEM Boric acid suppositories 600 mg #30 one per vagina 3 times weekly as needed for yeast refill x3  60 each  1  . predniSONE (DELTASONE) 20 MG tablet Take two tablets daily for 3 days followed by one tablet daily for 4 days  10 tablet  0  . tiZANidine (ZANAFLEX) 4 MG tablet Take 4 mg by mouth as needed.       No current facility-administered medications on file prior to visit.      Review of Systems Review of Systems  Constitutional: Negative for fever,  appetite change, fatigue and unexpected weight change.  Eyes: Negative for pain and visual disturbance.  Respiratory: Negative for cough and shortness of breath.   Cardiovascular: Negative for cp or palpitations    Gastrointestinal: Negative for nausea, diarrhea and constipation.  Genitourinary: Negative for urgency and frequency.  Skin: Negative for pallor or rash  pos for tender bump on head  MSK pos for low back pain that is improved  Neurological: Negative for weakness, light-headedness, numbness and headaches.  Hematological: Negative for adenopathy. Does not bruise/bleed easily.  Psychiatric/Behavioral: Negative for dysphoric mood. The patient is not nervous/anxious.         Objective:   Physical Exam  Constitutional: She appears well-developed and well-nourished. No distress.  HENT:  Head: Normocephalic and atraumatic.  Right Ear: External ear normal.  Left Ear: External ear normal.  Nose: Nose normal.  Mouth/Throat: Oropharynx is clear and moist.  Eyes: Conjunctivae and EOM are normal. Pupils are equal, round, and reactive to light. Right eye exhibits no discharge. Left eye exhibits no discharge. No scleral icterus.  Neck: Normal range of motion. Neck supple. No JVD present. No thyromegaly present.  Cardiovascular: Normal rate, regular rhythm, normal heart sounds and intact distal pulses.  Exam reveals no gallop.   Pulmonary/Chest: Effort normal and breath sounds normal. No respiratory distress. She has no wheezes. She has no rales.  Abdominal: Soft. Bowel sounds are normal. She exhibits no distension and no mass. There is no tenderness.  Genitourinary: Vagina normal. No vaginal discharge found.  See wet prep results No vulvar findings   Musculoskeletal: She exhibits no edema and no tenderness.  Limited rom LS  Lymphadenopathy:    She has no cervical adenopathy.  Neurological: She is alert. She has normal reflexes. No cranial nerve deficit. She exhibits normal muscle tone.  Coordination normal.  Skin: Skin is warm and dry. No rash noted. No erythema. No pallor.  Some tenderness at back of head-no laceration  Psychiatric: She has a normal mood and affect.          Assessment & Plan:

## 2013-01-27 NOTE — Patient Instructions (Signed)
Labs today Use your boric acid daily until you finish prednisone -and let your gyn know if not improved  Take care of yourself  If headache or other symptoms- let me know right away

## 2013-01-27 NOTE — Assessment & Plan Note (Signed)
Reviewed health habits including diet and exercise and skin cancer prevention Also reviewed health mt list, fam hx and immunizations   Wellness labs today  She will f/u with gyn for her gyn care

## 2013-01-28 LAB — CBC WITH DIFFERENTIAL/PLATELET
Basophils Absolute: 0 10*3/uL (ref 0.0–0.2)
Basos: 1 %
Eos: 1 %
Eosinophils Absolute: 0 10*3/uL (ref 0.0–0.4)
HCT: 42.3 % (ref 34.0–46.6)
Hemoglobin: 14.3 g/dL (ref 11.1–15.9)
Immature Grans (Abs): 0 10*3/uL (ref 0.0–0.1)
Immature Granulocytes: 0 %
Lymphocytes Absolute: 2.3 10*3/uL (ref 0.7–3.1)
Lymphs: 37 %
MCH: 29.4 pg (ref 26.6–33.0)
MCHC: 33.8 g/dL (ref 31.5–35.7)
MCV: 87 fL (ref 79–97)
Monocytes Absolute: 0.4 10*3/uL (ref 0.1–0.9)
Monocytes: 6 %
Neutrophils Absolute: 3.3 10*3/uL (ref 1.4–7.0)
Neutrophils Relative %: 55 %
RBC: 4.87 x10E6/uL (ref 3.77–5.28)
RDW: 13.5 % (ref 12.3–15.4)
WBC: 6 10*3/uL (ref 3.4–10.8)

## 2013-01-28 LAB — COMPREHENSIVE METABOLIC PANEL
ALT: 10 IU/L (ref 0–32)
AST: 13 IU/L (ref 0–40)
Albumin/Globulin Ratio: 1.6 (ref 1.1–2.5)
Albumin: 4.5 g/dL (ref 3.5–5.5)
Alkaline Phosphatase: 66 IU/L (ref 39–117)
BUN/Creatinine Ratio: 20 (ref 9–23)
BUN: 16 mg/dL (ref 6–24)
CO2: 22 mmol/L (ref 18–29)
Calcium: 9.3 mg/dL (ref 8.7–10.2)
Chloride: 101 mmol/L (ref 97–108)
Creatinine, Ser: 0.81 mg/dL (ref 0.57–1.00)
GFR calc Af Amer: 99 mL/min/{1.73_m2} (ref >59)
GFR calc non Af Amer: 86 mL/min/{1.73_m2} (ref >59)
Globulin, Total: 2.9 g/dL (ref 1.5–4.5)
Glucose: 106 mg/dL — ABNORMAL HIGH (ref 65–99)
Potassium: 4.6 mmol/L (ref 3.5–5.2)
Sodium: 139 mmol/L (ref 134–144)
Total Bilirubin: 0.5 mg/dL (ref 0.0–1.2)
Total Protein: 7.4 g/dL (ref 6.0–8.5)

## 2013-01-28 LAB — HEMOGLOBIN A1C
Est. average glucose Bld gHb Est-mCnc: 134 mg/dL
Hgb A1c MFr Bld: 6.3 % — ABNORMAL HIGH (ref 4.8–5.6)

## 2013-01-28 LAB — LIPID PANEL
Chol/HDL Ratio: 2.3 ratio units (ref 0.0–4.4)
Cholesterol, Total: 213 mg/dL — ABNORMAL HIGH (ref 100–199)
HDL: 93 mg/dL (ref >39)
LDL Calculated: 103 mg/dL — ABNORMAL HIGH (ref 0–99)
Triglycerides: 86 mg/dL (ref 0–149)
VLDL Cholesterol Cal: 17 mg/dL (ref 5–40)

## 2013-01-28 LAB — TSH: TSH: 1.17 u[IU]/mL (ref 0.450–4.500)

## 2013-02-14 ENCOUNTER — Other Ambulatory Visit: Payer: Self-pay | Admitting: Family Medicine

## 2013-02-17 NOTE — Telephone Encounter (Signed)
Left voicemail requesting pt to call office 

## 2013-02-17 NOTE — Telephone Encounter (Signed)
Please call pt - what symptoms is she having?

## 2013-02-17 NOTE — Telephone Encounter (Signed)
Electronic refill request, please advise  

## 2013-02-19 NOTE — Telephone Encounter (Signed)
Please refil times one and have her f/u if symptoms return

## 2013-02-19 NOTE — Telephone Encounter (Signed)
Rx sent and pt advise to f/u if no improvement

## 2013-02-19 NOTE — Telephone Encounter (Signed)
Pt said she is having yeast infections sxs, itching, discharge, please advise  Best # to call pt back on is [phone]

## 2013-03-17 ENCOUNTER — Encounter: Payer: Self-pay | Admitting: Gynecology

## 2013-03-17 ENCOUNTER — Ambulatory Visit (INDEPENDENT_AMBULATORY_CARE_PROVIDER_SITE_OTHER): Payer: 59 | Admitting: Gynecology

## 2013-03-17 ENCOUNTER — Other Ambulatory Visit: Payer: Self-pay | Admitting: Gynecology

## 2013-03-17 DIAGNOSIS — D251 Intramural leiomyoma of uterus: Secondary | ICD-10-CM

## 2013-03-17 DIAGNOSIS — R8290 Unspecified abnormal findings in urine: Secondary | ICD-10-CM

## 2013-03-17 DIAGNOSIS — N943 Premenstrual tension syndrome: Secondary | ICD-10-CM

## 2013-03-17 DIAGNOSIS — N949 Unspecified condition associated with female genital organs and menstrual cycle: Secondary | ICD-10-CM

## 2013-03-17 DIAGNOSIS — R82998 Other abnormal findings in urine: Secondary | ICD-10-CM

## 2013-03-17 DIAGNOSIS — R102 Pelvic and perineal pain: Secondary | ICD-10-CM

## 2013-03-17 LAB — URINALYSIS W MICROSCOPIC + REFLEX CULTURE
Bilirubin Urine: NEGATIVE
Casts: NONE SEEN
Crystals: NONE SEEN
Glucose, UA: NEGATIVE mg/dL
Ketones, ur: NEGATIVE mg/dL
Nitrite: NEGATIVE
Protein, ur: NEGATIVE mg/dL
Specific Gravity, Urine: <1.005 — ABNORMAL LOW (ref 1.005–1.030)
Urobilinogen, UA: 0.2 mg/dL (ref 0.0–1.0)
pH: 5 (ref 5.0–8.0)

## 2013-03-17 LAB — WET PREP FOR TRICH, YEAST, CLUE
Clue Cells Wet Prep HPF POC: NONE SEEN
Trich, Wet Prep: NONE SEEN
Yeast Wet Prep HPF POC: NONE SEEN

## 2013-03-17 MED ORDER — CIPROFLOXACIN HCL 250 MG PO TABS: 250.0000 mg | ORAL_TABLET | Freq: Two times a day (BID) | ORAL | Status: DC

## 2013-03-17 NOTE — Patient Instructions (Signed)
Take antibiotic twice daily for 7 days. Followup if symptoms persist, worsen or recur.

## 2013-03-17 NOTE — Progress Notes (Signed)
Patient presents with several week history of worsening urgency, frequency and suprapubic pain. No fever chills nausea vomiting diarrhea constipation. No low back pain. Notes that her vagina seems to hurt. Asked for STD screening because of the discomfort but has no reason otherwise to request. Also notes worsening PMS symptoms with anger and short temper around her menses.  Exam with Sherrilyn Rist Asst. Spine straight without CVA tenderness. Abdomen soft nontender without masses guarding rebound organomegaly. Pelvic external BUS vagina with slight discharge. Cervix normal. Uterus bulky consistent with history of leiomyoma. Adnexa without masses or tenderness  Assessment and plan:  and symptoms and urinalysis consistent with UTI. a think this accounts for her pelvic/vaginal pain. Did not do STD screening for this indication in patients comfortable with this. Ciprofloxacin 250 mg twice a day x7 days. Followup if symptoms persist worsen or recur. Check followup urine culture. Wet prep was negative. No treatment rendered. Discussed her PMS type symptoms. Options for management include better diet/exercise as well as interventional such as fluoxetine or Xanax when necessary discussed. Patient does not want medications and she'll try diet and exercise and followup with me in February when she is due for her annual exam.

## 2013-03-19 ENCOUNTER — Encounter: Payer: Self-pay | Admitting: Gynecology

## 2013-03-19 LAB — URINE CULTURE: Colony Count: 70000

## 2013-03-20 ENCOUNTER — Other Ambulatory Visit: Payer: Self-pay | Admitting: Gynecology

## 2013-03-20 MED ORDER — FLUCONAZOLE 200 MG PO TABS: 200.0000 mg | ORAL_TABLET | Freq: Every day | ORAL | Status: DC

## 2013-03-20 NOTE — Telephone Encounter (Signed)
Although we did not see at the visit, she did have yeast a month ago undergoing to cover her for this with Diflucan 200 mg daily x3 days. See if this does not help her. If it continues then recommend reexamination with office visit

## 2013-05-14 ENCOUNTER — Telehealth: Payer: Self-pay

## 2013-05-14 NOTE — Telephone Encounter (Signed)
Pt request new referral for back problem; pt said she cancelled appt in 12/2012 and pt wants to reschedule appt; pt cannot remember name of doctor.

## 2013-05-14 NOTE — Telephone Encounter (Signed)
Peggy Hicks- do you have that info so perhaps pt can just call and re schedule her appt? thanks

## 2013-05-22 ENCOUNTER — Encounter: Payer: Self-pay | Admitting: Gynecology

## 2013-05-22 ENCOUNTER — Other Ambulatory Visit (HOSPITAL_COMMUNITY)
Admission: RE | Admit: 2013-05-22 | Discharge: 2013-05-22 | Disposition: A | Discharge status: Home / Self Care | Payer: 59 | Source: Ambulatory Visit | Attending: Gynecology | Admitting: Gynecology

## 2013-05-22 ENCOUNTER — Ambulatory Visit (INDEPENDENT_AMBULATORY_CARE_PROVIDER_SITE_OTHER): Payer: 59 | Admitting: Gynecology

## 2013-05-22 VITALS — BP 120/80 | Ht 66.0 in | Wt 174.0 lb

## 2013-05-22 DIAGNOSIS — Z01419 Encounter for gynecological examination (general) (routine) without abnormal findings: Secondary | ICD-10-CM

## 2013-05-22 DIAGNOSIS — Z1151 Encounter for screening for human papillomavirus (HPV): Secondary | ICD-10-CM | POA: Insufficient documentation

## 2013-05-22 DIAGNOSIS — D251 Intramural leiomyoma of uterus: Secondary | ICD-10-CM

## 2013-05-22 NOTE — Progress Notes (Signed)
Peggy Hicks [birth year] [id number]        48 y.o.  G2P2002 for annual exam.  Several issues that are below.  Past medical history,surgical history, problem list, medications, allergies, family history and social history were all reviewed and documented in the EPIC chart.  ROS:  Performed and pertinent positives and negatives are included in the history, assessment and plan .  Exam: [name] assistant Filed Vitals:   05/22/13 1607  BP: 120/80  Height: 5\' 6"  (1.676 m)  Weight: 174 lb (78.926 kg)   General appearance  Normal Skin grossly normal Head/Neck normal with no cervical or supraclavicular adenopathy thyroid normal Lungs  clear Cardiac RR, without RMG Abdominal  soft, nontender, without masses, organomegaly or hernia Breasts  examined lying and sitting without masses, retractions, discharge or axillary adenopathy. Pelvic  Ext/BUS/vagina  normal  Cervix  normal. Pap/HPV done  Uterus  bulky irregular consistent with multiple myomas 8-10 weeks size midline mobile. Nontender.  Adnexa  Without masses or tenderness    Anus and perineum  Normal   Rectovaginal  Normal sphincter tone without palpated masses or tenderness.    Assessment/Plan:  48 y.o. G22P2002 female for annual exam regular menses, tubal sterilization.   1. Leiomyoma. Uterus is stable on exam roughly 8-10 weeks size. Menses have regulated to monthly relatively light lasting 7 days. No intermenstrual bleeding. Was evaluated this past year with sonohysterogram due to irregular bleeding which was negative excepting her myomas. [place] 21 year ago. Will keep menstrual calendar and as long regular menses that will monitor. If significant irregularity or significant menopausal symptoms develop she'll represent for evaluation. 2. Pap smear 2012. Pap/HPV today. No history of significant abnormal Pap smears previously. Repeat at 3-5 year intervals are normal. 3. Mammography 10/2012. Continue with annual mammography. SBE monthly  review. 4. Health maintenance. No lab work done as patient reports lab work done through her primary physician's office. She is planning screening colonoscopy this coming year as she turns 19. Followup one year, sooner if any issues.   Note: This document was prepared with digital dictation and possible smart phrase technology. Any transcriptional errors that result from this process are unintentional.   Anastasio Auerbach MD, 4:40 PM 05/22/2013

## 2013-05-22 NOTE — Patient Instructions (Addendum)
Followup in one year for annual exam, sooner if any issues.  Schedule colonoscopy with [facility] gastroenterology at [phone] or [facility] gastroenterology at [phone]

## 2013-05-23 LAB — URINALYSIS W MICROSCOPIC + REFLEX CULTURE
Bilirubin Urine: NEGATIVE
Casts: NONE SEEN
Glucose, UA: NEGATIVE mg/dL
Hgb urine dipstick: NEGATIVE
Ketones, ur: NEGATIVE mg/dL
Leukocytes, UA: NEGATIVE
Nitrite: NEGATIVE
Protein, ur: NEGATIVE mg/dL
Specific Gravity, Urine: 1.023 (ref 1.005–1.030)
Urobilinogen, UA: 0.2 mg/dL (ref 0.0–1.0)
pH: 5 (ref 5.0–8.0)

## 2013-05-24 LAB — URINE CULTURE: Colony Count: 50000

## 2013-05-30 ENCOUNTER — Encounter: Payer: Self-pay | Admitting: Gynecology

## 2013-06-17 ENCOUNTER — Other Ambulatory Visit: Payer: Self-pay | Admitting: Gynecology

## 2013-06-17 ENCOUNTER — Encounter: Payer: Self-pay | Admitting: Gynecology

## 2013-06-17 MED ORDER — SULFAMETHOXAZOLE-TMP DS 800-160 MG PO TABS: 1.0000 | ORAL_TABLET | Freq: Two times a day (BID) | ORAL | Status: DC

## 2013-06-17 NOTE — Telephone Encounter (Signed)
Tell patient multiple bacteria in urine from before meant it was a contaminated specimen but not infection. Does sound like she has an infection now I would recommend Septra DS 1 by mouth twice a day x3 days.

## 2013-06-18 ENCOUNTER — Ambulatory Visit: Payer: 59 | Admitting: Gynecology

## 2013-06-27 ENCOUNTER — Telehealth: Payer: Self-pay

## 2013-06-27 DIAGNOSIS — Z1211 Encounter for screening for malignant neoplasm of colon: Secondary | ICD-10-CM

## 2013-06-27 NOTE — Telephone Encounter (Signed)
Pt left v/m; pt will be turning 48 this year and request colonoscopy. Pt request cb.

## 2013-06-27 NOTE — Telephone Encounter (Signed)
To early to scheduled appt., Peggy Hicks called pt and advise her to call back closer to birthday to schedule appt, and referral was cancelled

## 2013-06-27 NOTE — Telephone Encounter (Signed)
I will do that referral - she will not turn 48 until after 8/30 it looks like so I don't know if we can schedule yet or not  She should hear back in the next few weeks

## 2013-07-29 ENCOUNTER — Encounter: Payer: Self-pay | Admitting: Family Medicine

## 2013-07-29 ENCOUNTER — Ambulatory Visit (INDEPENDENT_AMBULATORY_CARE_PROVIDER_SITE_OTHER): Payer: 59 | Admitting: Family Medicine

## 2013-07-29 VITALS — BP 128/86 | HR 70 | Temp 98.5°F | Ht 66.0 in | Wt 175.5 lb

## 2013-07-29 DIAGNOSIS — IMO0002 Reserved for concepts with insufficient information to code with codable children: Secondary | ICD-10-CM

## 2013-07-29 DIAGNOSIS — M5416 Radiculopathy, lumbar region: Secondary | ICD-10-CM

## 2013-07-29 DIAGNOSIS — F430 Acute stress reaction: Secondary | ICD-10-CM

## 2013-07-29 DIAGNOSIS — R079 Chest pain, unspecified: Secondary | ICD-10-CM

## 2013-07-29 DIAGNOSIS — F411 Generalized anxiety disorder: Secondary | ICD-10-CM

## 2013-07-29 DIAGNOSIS — M940 Chondrocostal junction syndrome [Tietze]: Secondary | ICD-10-CM | POA: Insufficient documentation

## 2013-07-29 MED ORDER — ALPRAZOLAM 0.5 MG PO TABS: 0.5000 mg | ORAL_TABLET | Freq: Two times a day (BID) | ORAL | Status: DC | PRN

## 2013-07-29 MED ORDER — ALUMINUM CHLORIDE 20 % EX SOLN: Freq: Every day | CUTANEOUS | Status: DC

## 2013-07-29 NOTE — Progress Notes (Signed)
Subjective:    Patient ID: Peggy Hicks, female    DOB: Jul 09, 1965, 48 y.o.   MRN: [id number]  HPI Here for multiple problems today   Throat discomfort- mucous in throat and it will not come up  ? If allergy problems  Has had some sinus headaches   Chest discomfort with a little sob  Center of chest - not tender to press on but sore to take a deep breath  Started 3 weeks ago - she went to urgent care - given prednisone pack - "nothing was explained to me" Also given mucinex (not coughing) It is improved -still hurts a little  ? 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 Px for that  Sometimes feet as well    Patient Active Problem List   Diagnosis Date Noted  . Costochondritis 07/29/2013  . Colon cancer screening 06/27/2013  . Vaginitis and vulvovaginitis 01/27/2013  . Left lumbar radiculopathy 12/10/2012  . Breast density 01/01/2012  . Routine general medical examination at a health care facility 01/01/2012  . Stress reaction 11/13/2011  . Muscle fasciculation 11/13/2011  . Hyperglycemia 11/15/2010  . PRIMARY FOCAL HYPERHIDROSIS 10/22/2007  . TEMPOROMANDIBULAR JOINT DISORDER 01/25/2007  . ANXIETY 08/01/2006   Past Medical History  Diagnosis Date  . Anxiety   . Fibroids   . Back pain   . Temporomandibular joint disorder (TMJ)   . Hyperhidrosis   .  Stress reaction    Past Surgical History  Procedure Laterality Date  . Breast surgery      Breast Reduction  . Myomectomy      Uterine fibroids removed/ hysteroscopic submucous myomectomy  . Tubal ligation    . Breast biopsy    . Cesarean section       X 2  . Hysteroscopy     History  Substance Use Topics  . Smoking status: Never Smoker   . Smokeless tobacco: Never Used  . Alcohol Use: No   Family History  Problem Relation Age of Onset  . Hypertension Mother   . Heart disease Mother   . Diabetes Mother   . Cancer Maternal Aunt     liver  . Hypertension Father    No Known Allergies Current Outpatient Prescriptions on File Prior to Visit  Medication Sig Dispense Refill  . Multiple Vitamin (MULTIVITAMIN) tablet Take 1 tablet by mouth daily.        . NONFORMULARY OR COMPOUNDED ITEM Boric acid suppositories 600 mg #30 one per vagina 3 times weekly as needed for yeast refill x3  60 each  1   No current facility-administered medications on file prior to visit.    Review of Systems Review of Systems  Constitutional: Negative for fever, appetite  change,and unexpected weight change.  Eyes: Negative for pain and visual disturbance.  ENt pos for post nasal drip /neg for sinus pain  Respiratory: Negative for cough and shortness of breath.   Cardiovascular: Negative for cp or palpitations    Gastrointestinal: Negative for nausea, diarrhea and constipation.  Genitourinary: Negative for urgency and frequency.  Skin: Negative for pallor or rash  pos for excessive sweating of soles and palms  MSK pos for low back pain  Neurological: Negative for weakness, light-headedness, numbness and headaches.  Hematological: Negative for adenopathy. Does not bruise/bleed easily.  Psychiatric/Behavioral: Negative for dysphoric mood. The patient is anxious pos for stressors        Objective:   Physical Exam  Constitutional: She appears well-developed and well-nourished. No distress.  HENT:    Head: Normocephalic and atraumatic.  Mouth/Throat: Oropharynx is clear and moist. No oropharyngeal exudate.  Nares are injected and congested  Clear post nasal drip  No throat erythema   Eyes: Conjunctivae and EOM are normal. Pupils are equal, round, and reactive to light. Right eye exhibits no discharge. Left eye exhibits no discharge.  Neck: Normal range of motion. Neck supple. No JVD present. No tracheal deviation present. No thyromegaly present.  Cardiovascular: Normal rate, regular rhythm, normal heart sounds and intact distal pulses.  Exam reveals no gallop.   No murmur heard. Pulmonary/Chest: Effort normal and breath sounds normal. No respiratory distress. She has no wheezes. She has no rales. She exhibits tenderness.  Mild anterior chest tenderness without skin change or crepitus   Musculoskeletal: She exhibits no edema.       Lumbar back: She exhibits decreased range of motion, tenderness, bony tenderness and spasm.  Lymphadenopathy:    She has no cervical adenopathy.  Neurological: She is alert. She has normal strength and normal reflexes. She displays no atrophy. No cranial nerve deficit or sensory deficit. She exhibits normal muscle tone. Coordination and gait normal.  Skin: Skin is warm and dry. No rash noted. No erythema. No pallor.  Psychiatric: She has a normal mood and affect.  Nl affect/seems generally fatigued           Assessment & Plan:

## 2013-07-29 NOTE — Patient Instructions (Signed)
Get zyrtec 10 mg over the counter for post nasal drip and take one a day  I think the chest soreness will get better - a heating pad sometimes helps but let us know if it worsens  Try drysol on your hands and feet as directed for sweating  Let us know if you think you need to see a counselor for stress- also exercise  Stop up front for an orthopedic referral for your back

## 2013-07-29 NOTE — Progress Notes (Signed)
Pre visit review using our clinic review tool, if applicable. No additional management support is needed unless otherwise documented below in the visit note. 

## 2013-07-30 NOTE — Assessment & Plan Note (Signed)
Reviewed stressors/ coping techniques/symptoms/ support sources/ tx options and side effects in detail today Refilled xanax for emergencies  Disc need for counseling

## 2013-07-30 NOTE — Assessment & Plan Note (Signed)
Improving after steroid pack Recommend warm comp Watch for cough or fever  Reassuring exam Handout given

## 2013-07-30 NOTE — Assessment & Plan Note (Signed)
More with stressors Recommend counseling

## 2013-07-30 NOTE — Assessment & Plan Note (Signed)
Has been to sport med and doing exercises  No imp  Ref to orthopedics

## 2013-10-28 ENCOUNTER — Encounter: Payer: Self-pay | Admitting: Gynecology

## 2013-10-30 ENCOUNTER — Other Ambulatory Visit: Payer: Self-pay | Admitting: Gynecology

## 2013-12-09 ENCOUNTER — Other Ambulatory Visit: Payer: 59

## 2013-12-16 ENCOUNTER — Encounter: Payer: 59 | Admitting: Family Medicine

## 2014-01-12 ENCOUNTER — Other Ambulatory Visit: Payer: Self-pay | Admitting: Family Medicine

## 2014-01-12 MED ORDER — ALPRAZOLAM 0.5 MG PO TABS: 0.5000 mg | ORAL_TABLET | Freq: Two times a day (BID) | ORAL | Status: DC | PRN

## 2014-01-12 NOTE — Telephone Encounter (Signed)
Px written for call in   

## 2014-01-13 NOTE — Telephone Encounter (Signed)
Rx called in as prescribed 

## 2014-01-19 ENCOUNTER — Telehealth: Payer: Self-pay | Admitting: Family Medicine

## 2014-01-19 DIAGNOSIS — Z1211 Encounter for screening for malignant neoplasm of colon: Secondary | ICD-10-CM

## 2014-01-19 NOTE — Telephone Encounter (Signed)
See prev message, does pt need appt with you or do you just put the order in for colonoscopy, please advise

## 2014-01-19 NOTE — Telephone Encounter (Signed)
Pt sent below my chart message   Appointment Request From: Peggy Hicks  With Provider: Loura Pardon, MD [-Primary Care Physician-]  Preferred Date Range: From 01/30/2014 To 03/02/2014  Preferred Times: Monday Morning, Tuesday Morning, Wednesday Morning  Reason: To address the following health maintenance concerns.  Colonoscopy Comments: Need to schedule a colonoscopy. Thank you

## 2014-01-19 NOTE — Telephone Encounter (Signed)
I did the referral for screening colonoscopy

## 2014-01-29 ENCOUNTER — Telehealth: Payer: Self-pay | Admitting: Family Medicine

## 2014-01-29 DIAGNOSIS — Z0000 Encounter for general adult medical examination without abnormal findings: Secondary | ICD-10-CM

## 2014-01-29 DIAGNOSIS — R739 Hyperglycemia, unspecified: Secondary | ICD-10-CM

## 2014-01-29 NOTE — Telephone Encounter (Signed)
Pt notified orders are in and Terri released them so they are in [place] system

## 2014-01-29 NOTE — Telephone Encounter (Signed)
I ordered future [facility] labs Unsure if Peggy Hicks needs to print out the order to fax or not  thanks

## 2014-01-29 NOTE — Addendum Note (Signed)
Addended by: Ellamae Sia on: 01/29/2014 03:19 PM   Modules accepted: Orders

## 2014-01-29 NOTE — Telephone Encounter (Signed)
Pt is requesting orders for labs to be done at [place]. Pt forgot that she had her physical scheduled for Monday.

## 2014-01-31 LAB — COMPREHENSIVE METABOLIC PANEL
ALT: 12 IU/L (ref 0–32)
AST: 13 IU/L (ref 0–40)
Albumin/Globulin Ratio: 1.8 (ref 1.1–2.5)
Albumin: 4.1 g/dL (ref 3.5–5.5)
Alkaline Phosphatase: 57 IU/L (ref 39–117)
BUN/Creatinine Ratio: 12 (ref 9–23)
BUN: 10 mg/dL (ref 6–24)
CO2: 25 mmol/L (ref 18–29)
Calcium: 9.2 mg/dL (ref 8.7–10.2)
Chloride: 100 mmol/L (ref 97–108)
Creatinine, Ser: 0.86 mg/dL (ref 0.57–1.00)
GFR calc Af Amer: 91 mL/min/{1.73_m2} (ref >59)
GFR calc non Af Amer: 79 mL/min/{1.73_m2} (ref >59)
Globulin, Total: 2.3 g/dL (ref 1.5–4.5)
Glucose: 100 mg/dL — ABNORMAL HIGH (ref 65–99)
Potassium: 4.4 mmol/L (ref 3.5–5.2)
Sodium: 138 mmol/L (ref 134–144)
Total Bilirubin: 0.5 mg/dL (ref 0.0–1.2)
Total Protein: 6.4 g/dL (ref 6.0–8.5)

## 2014-01-31 LAB — TSH: TSH: 1.55 u[IU]/mL (ref 0.450–4.500)

## 2014-01-31 LAB — HEMOGLOBIN A1C
Est. average glucose Bld gHb Est-mCnc: 126 mg/dL
Hgb A1c MFr Bld: 6 % — ABNORMAL HIGH (ref 4.8–5.6)

## 2014-01-31 LAB — CBC WITH DIFFERENTIAL/PLATELET
Basophils Absolute: 0 x10E3/uL (ref 0.0–0.2)
Basos: 0 %
Eos: 0 %
Eosinophils Absolute: 0 x10E3/uL (ref 0.0–0.4)
HCT: 33.3 % — ABNORMAL LOW (ref 34.0–46.6)
Hemoglobin: 11.2 g/dL (ref 11.1–15.9)
Immature Grans (Abs): 0 x10E3/uL (ref 0.0–0.1)
Immature Granulocytes: 0 %
Lymphocytes Absolute: 2.3 x10E3/uL (ref 0.7–3.1)
Lymphs: 41 %
MCH: 28.8 pg (ref 26.6–33.0)
MCHC: 33.6 g/dL (ref 31.5–35.7)
MCV: 86 fL (ref 79–97)
Monocytes Absolute: 0.3 x10E3/uL (ref 0.1–0.9)
Monocytes: 5 %
Neutrophils Absolute: 3 x10E3/uL (ref 1.4–7.0)
Neutrophils Relative %: 54 %
RBC: 3.89 x10E6/uL (ref 3.77–5.28)
RDW: 13.2 % (ref 12.3–15.4)
WBC: 5.6 x10E3/uL (ref 3.4–10.8)

## 2014-01-31 LAB — LIPID PANEL
Chol/HDL Ratio: 2.2 ratio units (ref 0.0–4.4)
Cholesterol, Total: 194 mg/dL (ref 100–199)
HDL: 88 mg/dL (ref >39)
LDL Calculated: 84 mg/dL (ref 0–99)
Triglycerides: 109 mg/dL (ref 0–149)
VLDL Cholesterol Cal: 22 mg/dL (ref 5–40)

## 2014-02-02 ENCOUNTER — Ambulatory Visit (INDEPENDENT_AMBULATORY_CARE_PROVIDER_SITE_OTHER): Payer: 59 | Admitting: Family Medicine

## 2014-02-02 ENCOUNTER — Encounter: Payer: Self-pay | Admitting: Family Medicine

## 2014-02-02 VITALS — BP 118/78 | HR 71 | Temp 98.7°F | Ht 66.0 in | Wt 172.5 lb

## 2014-02-02 DIAGNOSIS — Z1211 Encounter for screening for malignant neoplasm of colon: Secondary | ICD-10-CM

## 2014-02-02 DIAGNOSIS — R739 Hyperglycemia, unspecified: Secondary | ICD-10-CM

## 2014-02-02 DIAGNOSIS — R350 Frequency of micturition: Secondary | ICD-10-CM

## 2014-02-02 DIAGNOSIS — Z0000 Encounter for general adult medical examination without abnormal findings: Secondary | ICD-10-CM

## 2014-02-02 DIAGNOSIS — Z23 Encounter for immunization: Secondary | ICD-10-CM

## 2014-02-02 DIAGNOSIS — D649 Anemia, unspecified: Secondary | ICD-10-CM

## 2014-02-02 LAB — POCT URINALYSIS DIPSTICK
Bilirubin, UA: NEGATIVE
Blood, UA: NEGATIVE
Glucose, UA: NEGATIVE
Ketones, UA: NEGATIVE
Leukocytes, UA: NEGATIVE
Nitrite, UA: NEGATIVE
Protein, UA: NEGATIVE
Spec Grav, UA: 1.015
Urobilinogen, UA: 0.2
pH, UA: 6

## 2014-02-02 NOTE — Patient Instructions (Addendum)
Flu vaccine today  You are mildly anemic - likely from periods- so eat iron rich foods like deep leafy greens / occasionally red meat and liver  Also take a multivitamin with iron daily  Stop at check out for referral for colonoscopy

## 2014-02-02 NOTE — Progress Notes (Signed)
Pre visit review using our clinic review tool, if applicable. No additional management support is needed unless otherwise documented below in the visit note. 

## 2014-02-02 NOTE — Assessment & Plan Note (Signed)
Here for health maintenance exam and to review chronic medical problems   See HPI Flu vaccine today  Labs reviewed Enc further wt loss effort

## 2014-02-02 NOTE — Assessment & Plan Note (Signed)
Lab Results  Component Value Date   HGBA1C 6.0* 01/30/2014   Doing better with diet and wt loss Rev low glycemic diet

## 2014-02-02 NOTE — Assessment & Plan Note (Signed)
Refer for first screening colonoscopy 

## 2014-02-02 NOTE — Assessment & Plan Note (Signed)
Suspect due to menses  Will add more iron rich food to diet Also try mvi with iron daily  Continue to follow  Expect menopause soon

## 2014-02-02 NOTE — Progress Notes (Signed)
Subjective:    Patient ID: Peggy Hicks, female    DOB: 1965/10/18, 48 y.o.   MRN: [id number]  HPI Here for health maintenance exam and to review chronic medical problems    Is feeling well overall   Wt is down 3 lb with bmi of 27 Trying to loose - she works hard to loose and maintain it    Flu shot will get today Td 5/07  Mammogram 7/15 normal Self exam-no lumps or changes   Pap 2/15 normal -not high risk - sees gyn  Hx of myomectomy She is having periods every 21-28 days - not as heavy as it used to be   Colon cancer screening - she is ready to schedule first screen colonosc   Anemic - likely with menses  Lab Results  Component Value Date   WBC 5.6 01/30/2014   HGB 11.2 01/30/2014   HCT 33.3* 01/30/2014   MCV 86 01/30/2014   PLT 326 05/21/2012     Hyperglycemia improved Is eating better  Lab Results  Component Value Date   HGBA1C 6.0* 01/30/2014   Lipid screen Lab Results  Component Value Date   CHOL 229* 05/21/2012   HDL 88 01/30/2014   LDLCALC 84 01/30/2014   TRIG 109 01/30/2014   CHOLHDL 2.2 01/30/2014   very good profile   Mood- is pretty good except for period time  Not depressed   Results for orders placed or performed in visit on 02/02/14  POCT urinalysis dipstick  Result Value Ref Range   Color, UA light yellow    Clarity, UA clear    Glucose, UA neg.    Bilirubin, UA neg.    Ketones, UA neg.    Spec Grav, UA 1.015    Blood, UA neg.    pH, UA 6.0    Protein, UA neg.    Urobilinogen, UA 0.2    Nitrite, UA neg.    Leukocytes, UA Negative    pt had some inc frequency - but no dysuria   Patient Active Problem List   Diagnosis Date Noted  . Costochondritis 07/29/2013  . Colon cancer screening 06/27/2013  . Vaginitis and vulvovaginitis 01/27/2013  . Left lumbar radiculopathy 12/10/2012  . Breast density 01/01/2012  . Routine general medical examination at a health care facility 01/01/2012  . Stress reaction 11/13/2011  . Muscle  fasciculation 11/13/2011  . Hyperglycemia 11/15/2010  . PRIMARY FOCAL HYPERHIDROSIS 10/22/2007  . TEMPOROMANDIBULAR JOINT DISORDER 01/25/2007  . ANXIETY 08/01/2006   Past Medical History  Diagnosis Date  . Anxiety   . Fibroids   . Back pain   . Temporomandibular joint disorder (TMJ)   . Hyperhidrosis   . Stress reaction    Past Surgical History  Procedure Laterality Date  . Breast surgery      Breast Reduction  . Myomectomy      Uterine fibroids removed/ hysteroscopic submucous myomectomy  . Tubal ligation    . Breast biopsy    . Cesarean section       X 2  . Hysteroscopy     History  Substance Use Topics  . Smoking status: Never Smoker   . Smokeless tobacco: Never Used  . Alcohol Use: No   Family History  Problem Relation Age of Onset  . Hypertension Mother   . Heart disease Mother   . Diabetes Mother   . Cancer Maternal Aunt     liver  . Hypertension Father    No Known Allergies  Current Outpatient Prescriptions on File Prior to Visit  Medication Sig Dispense Refill  . ALPRAZolam (XANAX) 0.5 MG tablet Take 1 tablet (0.5 mg total) by mouth 2 (two) times daily as needed (anxiety for traveling). 30 tablet 0  . aluminum chloride (DRYSOL) 20 % external solution Apply topically at bedtime. To affected areas hands and feet 35 mL 3  . Boric Acid Topical POWD INSERT 1 SUPPOISTORY PER VAGINA 3 TIMES WEEKLY AS NEEDED FOR YEAST 60 Bottle 1  . Multiple Vitamin (MULTIVITAMIN) tablet Take 1 tablet by mouth daily.      . NONFORMULARY OR COMPOUNDED ITEM Boric acid suppositories 600 mg #30 one per vagina 3 times weekly as needed for yeast refill x3 60 each 1   No current facility-administered medications on file prior to visit.     Review of Systems Review of Systems  Constitutional: Negative for fever, appetite change,  and unexpected weight change. pos for intermittent fatigue  Eyes: Negative for pain and visual disturbance.  Respiratory: Negative for cough and shortness of  breath.   Cardiovascular: Negative for cp or palpitations    Gastrointestinal: Negative for nausea, diarrhea and constipation. Neg for dark stool or blood in stool  Genitourinary: Negative for urgency and frequency.  Skin: Negative for pallor or rash   Neurological: Negative for weakness, light-headedness, numbness and headaches.  Hematological: Negative for adenopathy. Does not bruise/bleed easily.  Psychiatric/Behavioral: Negative for dysphoric mood. The patient is not nervous/anxious.  pos for stressors        Objective:   Physical Exam  Constitutional: She appears well-developed and well-nourished. No distress.  HENT:  Head: Normocephalic and atraumatic.  Right Ear: External ear normal.  Left Ear: External ear normal.  Mouth/Throat: Oropharynx is clear and moist.  Eyes: Conjunctivae and EOM are normal. Pupils are equal, round, and reactive to light. No scleral icterus.  Neck: Normal range of motion. Neck supple. No JVD present. Carotid bruit is not present. No thyromegaly present.  Cardiovascular: Normal rate, regular rhythm, normal heart sounds and intact distal pulses.  Exam reveals no gallop.   Pulmonary/Chest: Effort normal and breath sounds normal. No respiratory distress. She has no wheezes. She exhibits no tenderness.  Abdominal: Soft. Bowel sounds are normal. She exhibits no distension, no abdominal bruit and no mass. There is no tenderness.  Genitourinary: No breast swelling, tenderness, discharge or bleeding.  Breast exam: No mass, nodules, thickening, tenderness, bulging, retraction, inflamation, nipple discharge or skin changes noted.  No axillary or clavicular LA.      Musculoskeletal: Normal range of motion. She exhibits no edema or tenderness.  Lymphadenopathy:    She has no cervical adenopathy.  Neurological: She is alert. She has normal reflexes. No cranial nerve deficit. She exhibits normal muscle tone. Coordination normal.  Skin: Skin is warm and dry. No rash  noted. No erythema. No pallor.  Psychiatric: She has a normal mood and affect.          Assessment & Plan:   Problem List Items Addressed This Visit      Other   Colon cancer screening    Refer for first screening colonoscopy     Relevant Orders      Ambulatory referral to Gastroenterology   Hyperglycemia    Lab Results  Component Value Date   HGBA1C 6.0* 01/30/2014   Doing better with diet and wt loss Rev low glycemic diet     Mild anemia    Suspect due to menses  Will add  more iron rich food to diet Also try mvi with iron daily  Continue to follow  Expect menopause soon     Routine general medical examination at a health care facility - Primary    Here for health maintenance exam and to review chronic medical problems   See HPI Flu vaccine today  Labs reviewed Enc further wt loss effort      Other Visit Diagnoses    Urinary frequency        Relevant Orders       POCT urinalysis dipstick (Completed)    Need for prophylactic vaccination and inoculation against influenza        Relevant Orders       Flu Vaccine QUAD 36+ mos PF IM (Fluarix Quad PF) (Completed)

## 2014-03-19 ENCOUNTER — Ambulatory Visit: Payer: 59 | Admitting: *Deleted

## 2014-03-19 VITALS — Ht 68.0 in | Wt 175.8 lb

## 2014-03-19 DIAGNOSIS — Z1211 Encounter for screening for malignant neoplasm of colon: Secondary | ICD-10-CM

## 2014-03-19 NOTE — Progress Notes (Signed)
No egg or soy allergy. ewm No home 02 use. ewm No diet pills. No blood thinners. ewm Pt states no issues with past sedation. ewm Pt declined emmi video. ewm

## 2014-04-10 ENCOUNTER — Ambulatory Visit (AMBULATORY_SURGERY_CENTER): Payer: 59 | Admitting: Internal Medicine

## 2014-04-10 ENCOUNTER — Encounter: Payer: Self-pay | Admitting: Internal Medicine

## 2014-04-10 VITALS — BP 116/80 | HR 64 | Temp 98.2°F | Resp 29 | Ht 68.0 in | Wt 175.0 lb

## 2014-04-10 DIAGNOSIS — D122 Benign neoplasm of ascending colon: Secondary | ICD-10-CM

## 2014-04-10 DIAGNOSIS — Z1211 Encounter for screening for malignant neoplasm of colon: Secondary | ICD-10-CM

## 2014-04-10 DIAGNOSIS — D120 Benign neoplasm of cecum: Secondary | ICD-10-CM

## 2014-04-10 MED ORDER — SODIUM CHLORIDE 0.9 % IV SOLN: 500.0000 mL | INTRAVENOUS | Status: DC

## 2014-04-10 NOTE — Op Note (Signed)
[place]  [address] [place], [address]   COLONOSCOPY PROCEDURE REPORT  PATIENT: Peggy Hicks, Peggy Hicks  MR#: [id number] BIRTHDATE: 02-10-1966 , 48  yrs. old GENDER: female ENDOSCOPIST: Gatha Mayer, MD, [facility] PROCEDURE DATE:  04/10/2014 PROCEDURE:   Colonoscopy with biopsy First Screening Colonoscopy - Avg.  risk and is 50 yrs.  old or older Yes.  Prior Negative Screening - Now for repeat screening. N/A  History of Adenoma - Now for follow-up colonoscopy & has been > or = to 3 yrs.  N/A  Polyps Removed Today? Yes. ASA CLASS:   Class I INDICATIONS:first colonoscopy and average risk for colorectal cancer. MEDICATIONS: Propofol 280 mg IV and Monitored anesthesia care  DESCRIPTION OF PROCEDURE:   After the risks benefits and alternatives of the procedure were thoroughly explained, informed consent was obtained.  The digital rectal exam revealed no abnormalities of the rectum.   The LB [id number]  endoscope was introduced through the anus and advanced to the cecum, which was identified by both the appendix and ileocecal valve. No adverse events experienced.   The quality of the prep was excellent, using MiraLax  The instrument was then slowly withdrawn as the colon was fully examined.      COLON FINDINGS: Three sessile polyps ranging from 1 to 4mm in size were found in the ascending colon and at the cecum.  Polypectomies were performed with cold forceps.  The resection was complete, the polyp tissue was completely retrieved and sent to histology.   The examination was otherwise normal.  Retroflexed views revealed no abnormalities. The time to cecum=3 minutes 30 seconds.  Withdrawal time=11 minutes 43 seconds.  The scope was withdrawn and the procedure completed. COMPLICATIONS: There were no immediate complications.  ENDOSCOPIC IMPRESSION: 1.   Three sessile polyps ranging from 1 to 75mm in size were found in the ascending colon and at the cecum;  polypectomies were performed with cold forceps 2.   The examination was otherwise normal - excellent prep - first screening  RECOMMENDATIONS: Timing of repeat colonoscopy will be determined by pathology findings.  eSigned:  Gatha Mayer, MD, [facility] 04/10/2014 9:27 AM   cc: Loura Pardon, MD and The Patient

## 2014-04-10 NOTE — Progress Notes (Signed)
Called to room to assist during endoscopic procedure.  Patient ID and intended procedure confirmed with present staff. Received instructions for my participation in the procedure from the performing physician.  

## 2014-04-10 NOTE — Progress Notes (Signed)
Procedure ends, to recovery, report given and VSS. 

## 2014-04-10 NOTE — Patient Instructions (Addendum)
I found and removed 3 very tiny polyps that look benign.  I will let you know pathology results and when to have another routine colonoscopy by mail.  I appreciate the opportunity to care for you. Gatha Mayer, MD, FACG   YOU HAD AN ENDOSCOPIC PROCEDURE TODAY AT [facility]: Refer to the procedure report that was given to you for any specific questions about what was found during the examination.  If the procedure report does not answer your questions, please call your gastroenterologist to clarify.  If you requested that your care partner not be given the details of your procedure findings, then the procedure report has been included in a sealed envelope for you to review at your convenience later.  YOU SHOULD EXPECT: Some feelings of bloating in the abdomen. Passage of more gas than usual.  Walking can help get rid of the air that was put into your GI tract during the procedure and reduce the bloating. If you had a lower endoscopy (such as a colonoscopy or flexible sigmoidoscopy) you may notice spotting of blood in your stool or on the toilet paper. If you underwent a bowel prep for your procedure, then you may not have a normal bowel movement for a few days.  DIET: Your first meal following the procedure should be a light meal and then it is ok to progress to your normal diet.  A half-sandwich or bowl of soup is an example of a good first meal.  Heavy or fried foods are harder to digest and may make you feel nauseous or bloated.  Likewise meals heavy in dairy and vegetables can cause extra gas to form and this can also increase the bloating.  Drink plenty of fluids but you should avoid alcoholic beverages for 24 hours.  ACTIVITY: Your care partner should take you home directly after the procedure.  You should plan to take it easy, moving slowly for the rest of the day.  You can resume normal activity the day after the procedure however you should NOT DRIVE or use heavy  machinery for 24 hours (because of the sedation medicines used during the test).    SYMPTOMS TO REPORT IMMEDIATELY: A gastroenterologist can be reached at any hour.  During normal business hours, 8:30 AM to 5:00 PM Monday through Friday, call [phone].  After hours and on weekends, please call the GI answering service at [phone] who will take a message and have the physician on call contact you.   Following lower endoscopy (colonoscopy or flexible sigmoidoscopy):  Excessive amounts of blood in the stool  Significant tenderness or worsening of abdominal pains  Swelling of the abdomen that is new, acute  Fever of 100F or higher   FOLLOW UP: If any biopsies were taken you will be contacted by phone or by letter within the next 1-3 weeks.  Call your gastroenterologist if you have not heard about the biopsies in 3 weeks.  Our staff will call the home number listed on your records the next business day following your procedure to check on you and address any questions or concerns that you may have at that time regarding the information given to you following your procedure. This is a courtesy call and so if there is no answer at the home number and we have not heard from you through the emergency physician on call, we will assume that you have returned to your regular daily activities without incident.  SIGNATURES/CONFIDENTIALITY: You and/or your  care partner have signed paperwork which will be entered into your electronic medical record.  These signatures attest to the fact that that the information above on your After Visit Summary has been reviewed and is understood.  Full responsibility of the confidentiality of this discharge information lies with you and/or your care-partner.   Polyp information given.

## 2014-04-13 ENCOUNTER — Telehealth: Payer: Self-pay | Admitting: *Deleted

## 2014-04-13 NOTE — Telephone Encounter (Signed)
  Follow up Call-  Call back number 04/10/2014  Post procedure Call Back phone  # [phone]  Permission to leave phone message Yes     Patient questions:  Message left to call us if necessary.

## 2014-04-14 ENCOUNTER — Encounter: Payer: Self-pay | Admitting: Internal Medicine

## 2014-04-14 DIAGNOSIS — Z86010 Personal history of colonic polyps: Secondary | ICD-10-CM

## 2014-04-14 DIAGNOSIS — Z860101 Personal history of adenomatous and serrated colon polyps: Secondary | ICD-10-CM | POA: Insufficient documentation

## 2014-04-14 HISTORY — DX: Personal history of colonic polyps: Z86.010

## 2014-04-14 HISTORY — DX: Personal history of adenomatous and serrated colon polyps: Z86.0101

## 2014-04-14 NOTE — Progress Notes (Signed)
Quick Note:  3 1-2 mm adenomas Repeat colonoscopy 2021 ______

## 2014-05-11 ENCOUNTER — Other Ambulatory Visit: Payer: Self-pay

## 2014-05-11 ENCOUNTER — Other Ambulatory Visit: Payer: Self-pay | Admitting: Gynecology

## 2014-05-11 ENCOUNTER — Encounter: Payer: Self-pay | Admitting: Gynecology

## 2014-05-11 MED ORDER — NONFORMULARY OR COMPOUNDED ITEM: Status: DC

## 2014-05-11 NOTE — Telephone Encounter (Signed)
Okay for boric acid suppositories 600 mg #30 one per vagina 2-3 times weekly as needed for yeast

## 2014-05-25 ENCOUNTER — Encounter: Payer: Self-pay | Admitting: Gynecology

## 2014-05-25 ENCOUNTER — Ambulatory Visit (INDEPENDENT_AMBULATORY_CARE_PROVIDER_SITE_OTHER): Payer: 59 | Admitting: Gynecology

## 2014-05-25 VITALS — BP 120/74 | Ht 66.0 in | Wt 175.0 lb

## 2014-05-25 DIAGNOSIS — L298 Other pruritus: Secondary | ICD-10-CM

## 2014-05-25 DIAGNOSIS — Z01419 Encounter for gynecological examination (general) (routine) without abnormal findings: Secondary | ICD-10-CM

## 2014-05-25 DIAGNOSIS — N898 Other specified noninflammatory disorders of vagina: Secondary | ICD-10-CM

## 2014-05-25 DIAGNOSIS — D251 Intramural leiomyoma of uterus: Secondary | ICD-10-CM

## 2014-05-25 LAB — WET PREP FOR TRICH, YEAST, CLUE
Clue Cells Wet Prep HPF POC: NONE SEEN
Trich, Wet Prep: NONE SEEN

## 2014-05-25 MED ORDER — FLUCONAZOLE 150 MG PO TABS: 150.0000 mg | ORAL_TABLET | Freq: Once | ORAL | Status: DC

## 2014-05-25 NOTE — Patient Instructions (Signed)
Use the Diflucan pills as needed for yeast.  You may obtain a copy of any labs that were done today by logging onto MyChart as outlined in the instructions provided with your AVS (after visit summary). The office will not call with normal lab results but certainly if there are any significant abnormalities then we will contact you.   Health Maintenance, Female A healthy lifestyle and preventative care can promote health and wellness.  Maintain regular health, dental, and eye exams.  Eat a healthy diet. Foods like vegetables, fruits, whole grains, low-fat dairy products, and lean protein foods contain the nutrients you need without too many calories. Decrease your intake of foods high in solid fats, added sugars, and salt. Get information about a proper diet from your caregiver, if necessary.  Regular physical exercise is one of the most important things you can do for your health. Most adults should get at least 150 minutes of moderate-intensity exercise (any activity that increases your heart rate and causes you to sweat) each week. In addition, most adults need muscle-strengthening exercises on 2 or more days a week.   Maintain a healthy weight. The body mass index (BMI) is a screening tool to identify possible weight problems. It provides an estimate of body fat based on height and weight. Your caregiver can help determine your BMI, and can help you achieve or maintain a healthy weight. For adults 20 years and older:  A BMI below 18.5 is considered underweight.  A BMI of 18.5 to 24.9 is normal.  A BMI of 25 to 29.9 is considered overweight.  A BMI of 30 and above is considered obese.  Maintain normal blood lipids and cholesterol by exercising and minimizing your intake of saturated fat. Eat a balanced diet with plenty of fruits and vegetables. Blood tests for lipids and cholesterol should begin at age 36 and be repeated every 5 years. If your lipid or cholesterol levels are high, you are  over 50, or you are a high risk for heart disease, you may need your cholesterol levels checked more frequently.Ongoing high lipid and cholesterol levels should be treated with medicines if diet and exercise are not effective.  If you smoke, find out from your caregiver how to quit. If you do not use tobacco, do not start.  Lung cancer screening is recommended for adults aged 29 80 years who are at high risk for developing lung cancer because of a history of smoking. Yearly low-dose computed tomography (CT) is recommended for people who have at least a 30-pack-year history of smoking and are a current smoker or have quit within the past 15 years. A pack year of smoking is smoking an average of 1 pack of cigarettes a day for 1 year (for example: 1 pack a day for 30 years or 2 packs a day for 15 years). Yearly screening should continue until the smoker has stopped smoking for at least 15 years. Yearly screening should also be stopped for people who develop a health problem that would prevent them from having lung cancer treatment.  If you are pregnant, do not drink alcohol. If you are breastfeeding, be very cautious about drinking alcohol. If you are not pregnant and choose to drink alcohol, do not exceed 1 drink per day. One drink is considered to be 12 ounces (355 mL) of beer, 5 ounces (148 mL) of wine, or 1.5 ounces (44 mL) of liquor.  Avoid use of street drugs. Do not share needles with anyone. Ask for  help if you need support or instructions about stopping the use of drugs.  High blood pressure causes heart disease and increases the risk of stroke. Blood pressure should be checked at least every 1 to 2 years. Ongoing high blood pressure should be treated with medicines, if weight loss and exercise are not effective.  If you are 55 to 51 years old, ask your caregiver if you should take aspirin to prevent strokes.  Diabetes screening involves taking a blood sample to check your fasting blood sugar  level. This should be done once every 3 years, after age 45, if you are within normal weight and without risk factors for diabetes. Testing should be considered at a younger age or be carried out more frequently if you are overweight and have at least 1 risk factor for diabetes.  Breast cancer screening is essential preventative care for women. You should practice "breast self-awareness." This means understanding the normal appearance and feel of your breasts and may include breast self-examination. Any changes detected, no matter how small, should be reported to a caregiver. Women in their 20s and 30s should have a clinical breast exam (CBE) by a caregiver as part of a regular health exam every 1 to 3 years. After age 40, women should have a CBE every year. Starting at age 40, women should consider having a mammogram (breast X-ray) every year. Women who have a family history of breast cancer should talk to their caregiver about genetic screening. Women at a high risk of breast cancer should talk to their caregiver about having an MRI and a mammogram every year.  Breast cancer gene (BRCA)-related cancer risk assessment is recommended for women who have family members with BRCA-related cancers. BRCA-related cancers include breast, ovarian, tubal, and peritoneal cancers. Having family members with these cancers may be associated with an increased risk for harmful changes (mutations) in the breast cancer genes BRCA1 and BRCA2. Results of the assessment will determine the need for genetic counseling and BRCA1 and BRCA2 testing.  The Pap test is a screening test for cervical cancer. Women should have a Pap test starting at age 21. Between ages 21 and 29, Pap tests should be repeated every 2 years. Beginning at age 30, you should have a Pap test every 3 years as long as the past 3 Pap tests have been normal. If you had a hysterectomy for a problem that was not cancer or a condition that could lead to cancer, then  you no longer need Pap tests. If you are between ages 65 and 70, and you have had normal Pap tests going back 10 years, you no longer need Pap tests. If you have had past treatment for cervical cancer or a condition that could lead to cancer, you need Pap tests and screening for cancer for at least 20 years after your treatment. If Pap tests have been discontinued, risk factors (such as a new sexual partner) need to be reassessed to determine if screening should be resumed. Some women have medical problems that increase the chance of getting cervical cancer. In these cases, your caregiver may recommend more frequent screening and Pap tests.  The human papillomavirus (HPV) test is an additional test that may be used for cervical cancer screening. The HPV test looks for the virus that can cause the cell changes on the cervix. The cells collected during the Pap test can be tested for HPV. The HPV test could be used to screen women aged 30 years and older,   and should be used in women of any age who have unclear Pap test results. After the age of 30, women should have HPV testing at the same frequency as a Pap test.  Colorectal cancer can be detected and often prevented. Most routine colorectal cancer screening begins at the age of 50 and continues through age 75. However, your caregiver may recommend screening at an earlier age if you have risk factors for colon cancer. On a yearly basis, your caregiver may provide home test kits to check for hidden blood in the stool. Use of a small camera at the end of a tube, to directly examine the colon (sigmoidoscopy or colonoscopy), can detect the earliest forms of colorectal cancer. Talk to your caregiver about this at age 50, when routine screening begins. Direct examination of the colon should be repeated every 5 to 10 years through age 75, unless early forms of pre-cancerous polyps or small growths are found.  Hepatitis C blood testing is recommended for all people born  from 1945 through 1965 and any individual with known risks for hepatitis C.  Practice safe sex. Use condoms and avoid high-risk sexual practices to reduce the spread of sexually transmitted infections (STIs). Sexually active women aged 25 and younger should be checked for Chlamydia, which is a common sexually transmitted infection. Older women with new or multiple partners should also be tested for Chlamydia. Testing for other STIs is recommended if you are sexually active and at increased risk.  Osteoporosis is a disease in which the bones lose minerals and strength with aging. This can result in serious bone fractures. The risk of osteoporosis can be identified using a bone density scan. Women ages 65 and over and women at risk for fractures or osteoporosis should discuss screening with their caregivers. Ask your caregiver whether you should be taking a calcium supplement or vitamin D to reduce the rate of osteoporosis.  Menopause can be associated with physical symptoms and risks. Hormone replacement therapy is available to decrease symptoms and risks. You should talk to your caregiver about whether hormone replacement therapy is right for you.  Use sunscreen. Apply sunscreen liberally and repeatedly throughout the day. You should seek shade when your shadow is shorter than you. Protect yourself by wearing long sleeves, pants, a wide-brimmed hat, and sunglasses year round, whenever you are outdoors.  Notify your caregiver of new moles or changes in moles, especially if there is a change in shape or color. Also notify your caregiver if a mole is larger than the size of a pencil eraser.  Stay current with your immunizations. Document Released: 10/03/2010 Document Revised: 07/15/2012 Document Reviewed: 10/03/2010 [employer] Patient Information 2014 [employer].   

## 2014-05-25 NOTE — Progress Notes (Signed)
Peggy Hicks March 28, 1966 [id number]        49 y.o.  G2P2002 for annual exam.  Several issues noted below.  Past medical history,surgical history, problem list, medications, allergies, family history and social history were all reviewed and documented as reviewed in the EPIC chart.  ROS:  Performed with pertinent positives and negatives included in the history, assessment and plan.   Additional significant findings :  none   Exam: [name] Counsellor Vitals:   05/25/14 1601  BP: 120/74  Height: 5\' 6"  (1.676 m)  Weight: 175 lb (79.379 kg)   General appearance:  Normal affect, orientation and appearance. Skin: Grossly normal HEENT: Without gross lesions.  No cervical or supraclavicular adenopathy. Thyroid normal.  Lungs:  Clear without wheezing, rales or rhonchi Cardiac: RR, without RMG Abdominal:  Soft, nontender, without masses, guarding, rebound, organomegaly or hernia Breasts:  Examined lying and sitting without masses, retractions, discharge or axillary adenopathy.  Bilateral reduction scars Pelvic:  Ext/BUS/vagina with slight white discharge  Cervix normal  Uterus 8-10 weeks size, bulky, midline mobile, nontender  Adnexa  Without masses or tenderness    Anus and perineum  Normal   Rectovaginal  Normal sphincter tone without palpated masses or tenderness.    Assessment/Plan:  49 y.o. G36P2002 female for annual exam with regular menses, tubal sterilization.   1. Vaginal irritation. Wet prep is positive for yeast. Does have a history of recurrent yeast infections. Using boric acid suppositories, doing well with this but notes that she took a bubble bath which frequently will precipitate a yeast infection. Has supply of boric acid at home and will call when needs more. Diflucan 150 mg #5 tabs provided to use when necessary. 2. Leiomyoma. Menses remain regular and relatively light. Uterus bulky consistent with past exams. Patient asymptomatic without pressure pain or heavy bleeding.  Continue to monitor. 3. Pap smear/HPV negative 05/2013.  No Pap smear done today. Plan repeat Pap smear at 3-5 year interval per current screening guidelines. 4. Mammography 10/2013. Continue with annual mammography. SBE monthly reviewed. 5. Colonoscopy 2016. Repeat at their recommended interval. 6. Health maintenance. No routine lab work done as patient reports is done at her primary physician's office. Follow up in one year, sooner as needed.     Anastasio Auerbach MD, 4:29 PM 05/25/2014

## 2014-05-26 LAB — URINALYSIS W MICROSCOPIC + REFLEX CULTURE
Bacteria, UA: NONE SEEN
Bilirubin Urine: NEGATIVE
Casts: NONE SEEN
Crystals: NONE SEEN
Glucose, UA: NEGATIVE mg/dL
Hgb urine dipstick: NEGATIVE
Ketones, ur: NEGATIVE mg/dL
Leukocytes, UA: NEGATIVE
Nitrite: NEGATIVE
Protein, ur: NEGATIVE mg/dL
Specific Gravity, Urine: 1.01 (ref 1.005–1.030)
Squamous Epithelial / LPF: NONE SEEN
Urobilinogen, UA: 0.2 mg/dL (ref 0.0–1.0)
pH: 6 (ref 5.0–8.0)

## 2014-07-29 ENCOUNTER — Ambulatory Visit (INDEPENDENT_AMBULATORY_CARE_PROVIDER_SITE_OTHER): Payer: 59 | Admitting: Women's Health

## 2014-07-29 ENCOUNTER — Encounter: Payer: Self-pay | Admitting: Women's Health

## 2014-07-29 VITALS — BP 130/80 | Wt 174.0 lb

## 2014-07-29 DIAGNOSIS — R350 Frequency of micturition: Secondary | ICD-10-CM

## 2014-07-29 DIAGNOSIS — N898 Other specified noninflammatory disorders of vagina: Secondary | ICD-10-CM

## 2014-07-29 LAB — WET PREP FOR TRICH, YEAST, CLUE
Clue Cells Wet Prep HPF POC: NONE SEEN
Trich, Wet Prep: NONE SEEN
WBC, Wet Prep HPF POC: NONE SEEN
Yeast Wet Prep HPF POC: NONE SEEN

## 2014-07-29 LAB — URINALYSIS W MICROSCOPIC + REFLEX CULTURE
Bilirubin Urine: NEGATIVE
Glucose, UA: NEGATIVE mg/dL
Hgb urine dipstick: NEGATIVE
Ketones, ur: NEGATIVE mg/dL
Leukocytes, UA: NEGATIVE
Nitrite: NEGATIVE
Protein, ur: NEGATIVE mg/dL
Specific Gravity, Urine: 1.01 (ref 1.005–1.030)
Urobilinogen, UA: 0.2 mg/dL (ref 0.0–1.0)
pH: 7 (ref 5.0–8.0)

## 2014-07-29 NOTE — Progress Notes (Signed)
Patient ID: Peggy Hicks, female   DOB: Mar 29, 1966, 49 y.o.   MRN: [id number] Presents with complaint of a yellow discharge without odor or itching but a change. Denies abdominal pain, urinary symptoms or fever. Regular monthly cycle/BTL/same partner. Having occasional hot flushes and moodiness around cycles. History of fibroids.  Exam: Appears well. External genitalia within normal limits, speculum exam no erythema, moderate amount of a white discharge without odor noted, wet prep negative. Bimanual, uterus 12 week size nodular. No adnexal tenderness or fullness. UA: Negative  Negative wet prep Fibroid uterus  Plan: Reviewed normality of exam. Menopause reviewed.

## 2014-08-06 ENCOUNTER — Encounter: Payer: Self-pay | Admitting: Family Medicine

## 2014-08-12 ENCOUNTER — Other Ambulatory Visit: Payer: Self-pay | Admitting: Family Medicine

## 2014-08-12 MED ORDER — ALPRAZOLAM 0.5 MG PO TABS: 0.5000 mg | ORAL_TABLET | Freq: Two times a day (BID) | ORAL | Status: DC | PRN

## 2014-08-12 NOTE — Telephone Encounter (Signed)
Rx called in as prescribed 

## 2014-08-12 NOTE — Telephone Encounter (Signed)
Px written for call in   

## 2014-08-12 NOTE — Telephone Encounter (Signed)
Electronic refill request, last refilled on 01/12/14 #30 0 refills, last OV was a CPE on 02/02/14, please advise

## 2014-10-12 ENCOUNTER — Encounter: Payer: Self-pay | Admitting: Family Medicine

## 2014-11-16 ENCOUNTER — Other Ambulatory Visit: Payer: Self-pay | Admitting: Gynecology

## 2014-11-16 ENCOUNTER — Telehealth: Payer: Self-pay

## 2014-11-16 MED ORDER — NONFORMULARY OR COMPOUNDED ITEM: Status: DC

## 2014-11-16 NOTE — Telephone Encounter (Signed)
Patient received Rx for Boric Acid Supp in Feb but it was in error sent to [facility] called for the Rx and it was provided. Updated in chart.

## 2015-01-01 ENCOUNTER — Ambulatory Visit (INDEPENDENT_AMBULATORY_CARE_PROVIDER_SITE_OTHER): Payer: 59 | Admitting: Women's Health

## 2015-01-01 ENCOUNTER — Encounter: Payer: Self-pay | Admitting: Women's Health

## 2015-01-01 VITALS — BP 126/80 | Ht 64.0 in | Wt 174.0 lb

## 2015-01-01 DIAGNOSIS — N898 Other specified noninflammatory disorders of vagina: Secondary | ICD-10-CM

## 2015-01-01 DIAGNOSIS — R1031 Right lower quadrant pain: Secondary | ICD-10-CM | POA: Diagnosis not present

## 2015-01-01 DIAGNOSIS — R350 Frequency of micturition: Secondary | ICD-10-CM | POA: Diagnosis not present

## 2015-01-01 LAB — URINALYSIS W MICROSCOPIC + REFLEX CULTURE
Bacteria, UA: NONE SEEN [HPF]
Bilirubin Urine: NEGATIVE
Casts: NONE SEEN [LPF]
Crystals: NONE SEEN [HPF]
Glucose, UA: NEGATIVE
Hgb urine dipstick: NEGATIVE
Ketones, ur: NEGATIVE
Leukocytes, UA: NEGATIVE
Nitrite: NEGATIVE
Protein, ur: NEGATIVE
RBC / HPF: NONE SEEN RBC/HPF (ref <=2)
Specific Gravity, Urine: 1.015 (ref 1.001–1.035)
WBC, UA: NONE SEEN WBC/HPF (ref <=5)
Yeast: NONE SEEN [HPF]
pH: 7.5 (ref 5.0–8.0)

## 2015-01-01 LAB — WET PREP FOR TRICH, YEAST, CLUE
Clue Cells Wet Prep HPF POC: NONE SEEN
Trich, Wet Prep: NONE SEEN
Yeast Wet Prep HPF POC: NONE SEEN

## 2015-01-01 MED ORDER — IBUPROFEN 600 MG PO TABS: 600.0000 mg | ORAL_TABLET | Freq: Three times a day (TID) | ORAL | Status: DC | PRN

## 2015-01-01 NOTE — Progress Notes (Signed)
Patient ID: Peggy Hicks, female   DOB: 1965-12-05, 49 y.o.   MRN: [id number] Presents with a 2 day history of right lower abdominal pain and mild vaginal itching.  Denies urinary symptoms, discharge, odor, fever, diarrhea, constipation, or any recent antibiotic use.  Reports seeing a small spot of blood with wiping, has monthly cycles, BTL.  Continues to use boric acid suppositories as needed, not on a regular basis.  History of numerous uterine fibroids.  Recent workout change,  [employer]camp exercise class on Wednesday with heavy weight lifting.    Exam: appears well, external genitalia normal.  Speculum exam reveals a non-erythematous vagina and cervix with moderate white discharge, wet prep negative. Bimanual exam:  Tenderness in the RLQ  no involuntary guarding, refferred or rebound pain, uterus nodular and 10 weeks size.   Abdominal exam bowel sounds present, tenderness more right lower quadrant with deep palpation no rebound. UA: normal  RLQ abdominal pain: Probable muscle strain  Fibroid uterus  Plan: Prescribed Motrin 600 mg every 6 hours, when necessary. Light exercise for the next week to see if pain resolves. Call to schedule ultrasound if pain does not resolve or worsens.

## 2015-02-02 ENCOUNTER — Encounter: Payer: Self-pay | Admitting: Family Medicine

## 2015-02-02 ENCOUNTER — Ambulatory Visit (INDEPENDENT_AMBULATORY_CARE_PROVIDER_SITE_OTHER): Payer: 59 | Admitting: Family Medicine

## 2015-02-02 VITALS — BP 125/80 | HR 61 | Temp 98.8°F | Ht 66.0 in | Wt 171.8 lb

## 2015-02-02 DIAGNOSIS — J309 Allergic rhinitis, unspecified: Secondary | ICD-10-CM | POA: Insufficient documentation

## 2015-02-02 DIAGNOSIS — H6981 Other specified disorders of Eustachian tube, right ear: Secondary | ICD-10-CM | POA: Diagnosis not present

## 2015-02-02 DIAGNOSIS — H6980 Other specified disorders of Eustachian tube, unspecified ear: Secondary | ICD-10-CM | POA: Insufficient documentation

## 2015-02-02 DIAGNOSIS — J302 Other seasonal allergic rhinitis: Secondary | ICD-10-CM

## 2015-02-02 MED ORDER — ALPRAZOLAM 0.5 MG PO TABS: 0.5000 mg | ORAL_TABLET | Freq: Two times a day (BID) | ORAL | Status: DC | PRN

## 2015-02-02 MED ORDER — FLUTICASONE PROPIONATE 50 MCG/ACT NA SUSP: 2.0000 | Freq: Every day | NASAL | Status: DC

## 2015-02-02 NOTE — Progress Notes (Signed)
Pre visit review using our clinic review tool, if applicable. No additional management support is needed unless otherwise documented below in the visit note. 

## 2015-02-02 NOTE — Progress Notes (Signed)
Subjective:    Patient ID: Peggy Hicks, female    DOB: [birth year], 49 y.o.   MRN: [id number]  HPI Here with R ear pain  Also sinus pressure and pain around her eyes  Not too much congested -but a lot of headaches  Clearing throat/ some post nasal drip   No TMJ symptoms No fever   Needs refill on xanax -does not take it often   Patient Active Problem List   Diagnosis Date Noted  . Hx of adenomatous colonic polyps 04/14/2014  . Mild anemia 02/02/2014  . Costochondritis 07/29/2013  . Vaginitis and vulvovaginitis 01/27/2013  . Left lumbar radiculopathy 12/10/2012  . Breast density 01/01/2012  . Routine general medical examination at a health care facility 01/01/2012  . Stress reaction 11/13/2011  . Muscle fasciculation 11/13/2011  . Hyperglycemia 11/15/2010  . PRIMARY FOCAL HYPERHIDROSIS 10/22/2007  . TEMPOROMANDIBULAR JOINT DISORDER 01/25/2007  . ANXIETY 08/01/2006   Past Medical History  Diagnosis Date  . Fibroids   . Hyperhidrosis   . Stress reaction   . Hx of adenomatous colonic polyps 04/14/2014   Past Surgical History  Procedure Laterality Date  . Breast surgery      Breast Reduction  . Myomectomy      Uterine fibroids removed/ hysteroscopic submucous myomectomy  . Tubal ligation    . Breast biopsy    . Cesarean section       X 2  . Hysteroscopy     Social History  Substance Use Topics  . Smoking status: Never Smoker   . Smokeless tobacco: Never Used  . Alcohol Use: 0.0 oz/week    0 Standard drinks or equivalent per week     Comment: Rare   Family History  Problem Relation Age of Onset  . Hypertension Mother   . Heart disease Mother   . Diabetes Mother   . Cancer Maternal Aunt     liver  . Hypertension Father   . Colon cancer Neg Hx   . Esophageal cancer Neg Hx   . Rectal cancer Neg Hx   . Stomach cancer Neg Hx    No Known Allergies Current Outpatient Prescriptions on File Prior to Visit  Medication Sig Dispense Refill  . ALPRAZolam  (XANAX) 0.5 MG tablet Take 1 tablet (0.5 mg total) by mouth 2 (two) times daily as needed (anxiety for traveling). 30 tablet 0  . Biotin 10 MG CAPS Take 1 capsule by mouth daily.    Marland Kitchen DICLOFENAC PO Take 75 mg by mouth as needed.    Marland Kitchen ibuprofen (ADVIL,MOTRIN) 600 MG tablet Take 1 tablet (600 mg total) by mouth every 8 (eight) hours as needed. 60 tablet 1  . Multiple Vitamins-Minerals (MULTIVITAMIN PO) Take 1 tablet by mouth daily. Women's multivitamin daily    . NONFORMULARY OR COMPOUNDED ITEM Boric acid suppositories 600 mg #30 one per vagina 3 times weekly as needed for yeast refill x3 30 each 1   No current facility-administered medications on file prior to visit.      Review of Systems Review of Systems  Constitutional: Negative for fever, appetite change, fatigue and unexpected weight change.  Eyes: Negative for pain and visual disturbance.  ENT pos for cong and rhinorrhea and sinus pressure  Respiratory: Negative for cough and shortness of breath.  neg for wheeze  Cardiovascular: Negative for cp or palpitations    Gastrointestinal: Negative for nausea, diarrhea and constipation.  Genitourinary: Negative for urgency and frequency.  Skin: Negative for pallor or  rash   Neurological: Negative for weakness, light-headedness, numbness and headaches.  Hematological: Negative for adenopathy. Does not bruise/bleed easily.  Psychiatric/Behavioral: Negative for dysphoric mood. The patient is not nervous/anxious.         Objective:   Physical Exam  Constitutional: She appears well-developed and well-nourished. No distress.  Well appearing   HENT:  Head: Normocephalic and atraumatic.  Mouth/Throat: Oropharynx is clear and moist.  Nares are injected and congested  No sinus tenderness Clear rhinorrhea and post nasal drip  bilat TMs are dull with small effusions  Eyes: Conjunctivae and EOM are normal. Pupils are equal, round, and reactive to light. Right eye exhibits no discharge. Left eye  exhibits no discharge.  Neck: Normal range of motion. Neck supple.  Cardiovascular: Normal rate and normal heart sounds.   Pulmonary/Chest: Effort normal and breath sounds normal. No respiratory distress. She has no wheezes. She has no rales.  Lymphadenopathy:    She has no cervical adenopathy.  Neurological: She is alert.  Skin: Skin is warm and dry. No rash noted.  Psychiatric: She has a normal mood and affect.          Assessment & Plan:   Problem List Items Addressed This Visit      Respiratory   Allergic rhinitis    With ETD and congestion Px flonase to use through the season Update if not starting to improve in a week or if worsening  -esp if inc ear pain or any sinus pain         Nervous and Auditory   ETD (eustachian tube dysfunction) - Primary    Suspect from all rhinitis /cong Trial of flonase Update if not starting to improve in a week or if worsening  - esp if worse ear pain or fever

## 2015-02-02 NOTE — Patient Instructions (Signed)
I think you have some allergy sinus and ear congestion (no infection) Use flonase nasal spray as directed for at least a month  Avoid allergens when you can   Update if not starting to improve in a week or if worsening    Here is a refill on xanax

## 2015-02-03 NOTE — Assessment & Plan Note (Signed)
With ETD and congestion Px flonase to use through the season Update if not starting to improve in a week or if worsening  -esp if inc ear pain or any sinus pain

## 2015-02-03 NOTE — Assessment & Plan Note (Addendum)
Suspect from all rhinitis /cong Trial of flonase Update if not starting to improve in a week or if worsening  - esp if worse ear pain or fever

## 2015-03-23 ENCOUNTER — Ambulatory Visit: Payer: 59 | Admitting: Family Medicine

## 2015-03-23 ENCOUNTER — Encounter: Payer: Self-pay | Admitting: Primary Care

## 2015-03-23 ENCOUNTER — Ambulatory Visit (INDEPENDENT_AMBULATORY_CARE_PROVIDER_SITE_OTHER): Payer: 59 | Admitting: Primary Care

## 2015-03-23 VITALS — BP 134/84 | HR 78 | Temp 98.3°F | Ht 66.0 in | Wt 176.8 lb

## 2015-03-23 DIAGNOSIS — J029 Acute pharyngitis, unspecified: Secondary | ICD-10-CM | POA: Diagnosis not present

## 2015-03-23 LAB — POCT RAPID STREP A (OFFICE): Rapid Strep A Screen: NEGATIVE

## 2015-03-23 NOTE — Patient Instructions (Signed)
Your strep test was negative which means your symptoms are likely caused from a virus.  Sore throat: Ibuprofen 600 mg three times daily as needed for pain and inflammation. Warm salt gargles three times daily.  Ear fullness/throat drainage: Zyrtec or Claritin. You may take Benadryl at bedtime.  Please call us if you develop fevers, chills, increase in cough.   It was a pleasure meeting you!  Pharyngitis Pharyngitis is redness, pain, and swelling (inflammation) of your pharynx.  CAUSES  Pharyngitis is usually caused by infection. Most of the time, these infections are from viruses (viral) and are part of a cold. However, sometimes pharyngitis is caused by bacteria (bacterial). Pharyngitis can also be caused by allergies. Viral pharyngitis may be spread from person to person by coughing, sneezing, and personal items or utensils (cups, forks, spoons, toothbrushes). Bacterial pharyngitis may be spread from person to person by more intimate contact, such as kissing.  SIGNS AND SYMPTOMS  Symptoms of pharyngitis include:   Sore throat.   Tiredness (fatigue).   Low-grade fever.   Headache.  Joint pain and muscle aches.  Skin rashes.  Swollen lymph nodes.  Plaque-like film on throat or tonsils (often seen with bacterial pharyngitis). DIAGNOSIS  Your health care provider will ask you questions about your illness and your symptoms. Your medical history, along with a physical exam, is often all that is needed to diagnose pharyngitis. Sometimes, a rapid strep test is done. Other lab tests may also be done, depending on the suspected cause.  TREATMENT  Viral pharyngitis will usually get better in 3-4 days without the use of medicine. Bacterial pharyngitis is treated with medicines that kill germs (antibiotics).  HOME CARE INSTRUCTIONS   Drink enough water and fluids to keep your urine clear or pale yellow.   Only take over-the-counter or prescription medicines as directed by your  health care provider:   If you are prescribed antibiotics, make sure you finish them even if you start to feel better.   Do not take aspirin.   Get lots of rest.   Gargle with 8 oz of salt water ( tsp of salt per 1 qt of water) as often as every 1-2 hours to soothe your throat.   Throat lozenges (if you are not at risk for choking) or sprays may be used to soothe your throat. SEEK MEDICAL CARE IF:   You have large, tender lumps in your neck.  You have a rash.  You cough up green, yellow-brown, or bloody spit. SEEK IMMEDIATE MEDICAL CARE IF:   Your neck becomes stiff.  You drool or are unable to swallow liquids.  You vomit or are unable to keep medicines or liquids down.  You have severe pain that does not go away with the use of recommended medicines.  You have trouble breathing (not caused by a stuffy nose). MAKE SURE YOU:   Understand these instructions.  Will watch your condition.  Will get help right away if you are not doing well or get worse.   This information is not intended to replace advice given to you by your health care provider. Make sure you discuss any questions you have with your health care provider.   Document Released: 03/20/2005 Document Revised: 01/08/2013 Document Reviewed: 11/25/2012 Elsevier Interactive Patient Education [employer].

## 2015-03-23 NOTE — Progress Notes (Signed)
Subjective:    Patient ID: Peggy Hicks, female    DOB: [birth year], 49 y.o.   MRN: [id number]  HPI  Peggy Hicks is a 49 year old female who presents today with a chief complaint of sore throat. She also reports ear pain and cough. Her symptoms have been present for the past 2-3 days. Denies fevers, productive cough, sick contacts. She has taken alka selzer cold plus and flonase with improvement.   Review of Systems  HENT: Positive for ear pain and sore throat. Negative for congestion.   Respiratory: Positive for cough. Negative for shortness of breath.   Cardiovascular: Positive for chest pain.  Gastrointestinal: Negative for nausea.       Past Medical History  Diagnosis Date  . Fibroids   . Hyperhidrosis   . Stress reaction   . Hx of adenomatous colonic polyps 04/14/2014    Social History   Social History  . Marital Status: Married    Spouse Name: N/A  . Number of Children: N/A  . Years of Education: N/A   Occupational History  . Not on file.   Social History Main Topics  . Smoking status: Never Smoker   . Smokeless tobacco: Never Used  . Alcohol Use: 0.0 oz/week    0 Standard drinks or equivalent per week     Comment: Rare  . Drug Use: No  . Sexual Activity: Yes    Birth Control/ Protection: Surgical     Comment: Tubal lig-1st intercourse 49 yo--Fewer than 5 partners   Other Topics Concern  . Not on file   Social History Narrative   Exercises regularly    Past Surgical History  Procedure Laterality Date  . Breast surgery      Breast Reduction  . Myomectomy      Uterine fibroids removed/ hysteroscopic submucous myomectomy  . Tubal ligation    . Breast biopsy    . Cesarean section       X 2  . Hysteroscopy      Family History  Problem Relation Age of Onset  . Hypertension Mother   . Heart disease Mother   . Diabetes Mother   . Cancer Maternal Aunt     liver  . Hypertension Father   . Colon cancer Neg Hx   . Esophageal cancer Neg Hx   . Rectal  cancer Neg Hx   . Stomach cancer Neg Hx     No Known Allergies  Current Outpatient Prescriptions on File Prior to Visit  Medication Sig Dispense Refill  . ALPRAZolam (XANAX) 0.5 MG tablet Take 1 tablet (0.5 mg total) by mouth 2 (two) times daily as needed (anxiety for traveling). 30 tablet 0  . Biotin 10 MG CAPS Take 1 capsule by mouth daily.    Marland Kitchen DICLOFENAC PO Take 75 mg by mouth as needed.    . fluticasone (FLONASE) 50 MCG/ACT nasal spray Place 2 sprays into both nostrils daily. 16 g 11  . ibuprofen (ADVIL,MOTRIN) 600 MG tablet Take 1 tablet (600 mg total) by mouth every 8 (eight) hours as needed. 60 tablet 1  . Multiple Vitamins-Minerals (MULTIVITAMIN PO) Take 1 tablet by mouth daily. Women's multivitamin daily    . NONFORMULARY OR COMPOUNDED ITEM Boric acid suppositories 600 mg #30 one per vagina 3 times weekly as needed for yeast refill x3 (Patient not taking: Reported on 03/23/2015) 30 each 1   No current facility-administered medications on file prior to visit.    BP 134/84 mmHg  Pulse 78  Temp(Src) 98.3 F (36.8 C) (Oral)  Ht 5\' 6"  (1.676 m)  Wt 176 lb 12.8 oz (80.196 kg)  BMI 28.55 kg/m2  SpO2 99%  LMP 03/18/2015    Objective:   Physical Exam  Constitutional: She appears well-nourished.  HENT:  Right Ear: Ear canal normal. A middle ear effusion is present.  Left Ear: Tympanic membrane and ear canal normal.  Nose: Right sinus exhibits no maxillary sinus tenderness and no frontal sinus tenderness. Left sinus exhibits no maxillary sinus tenderness and no frontal sinus tenderness.  Mouth/Throat: Posterior oropharyngeal erythema present. No oropharyngeal exudate or posterior oropharyngeal edema.  Eyes: Conjunctivae are normal. Pupils are equal, round, and reactive to light.  Neck: Neck supple.  Cardiovascular: Normal rate and regular rhythm.   Pulmonary/Chest: Effort normal and breath sounds normal.  Lymphadenopathy:    She has no cervical adenopathy.  Skin: Skin is  warm and dry.          Assessment & Plan:  Sore throat:  Present with ear pain and mild cough x 2-3 days. Some improvement with OTC remedies. Exam with erythema to posterior pharynx, otherwise exam is unremarkable. Rapid Strep: Negative Will treat with supportive measures. Ibuprofen, Zyrtec at bedtime, Flonase. Return precautions provided.

## 2015-03-23 NOTE — Progress Notes (Signed)
Pre visit review using our clinic review tool, if applicable. No additional management support is needed unless otherwise documented below in the visit note. 

## 2015-04-05 ENCOUNTER — Telehealth: Payer: Self-pay | Admitting: Family Medicine

## 2015-04-05 DIAGNOSIS — R739 Hyperglycemia, unspecified: Secondary | ICD-10-CM

## 2015-04-05 DIAGNOSIS — Z0000 Encounter for general adult medical examination without abnormal findings: Secondary | ICD-10-CM

## 2015-04-05 NOTE — Telephone Encounter (Signed)
-----   Message from Ellamae Sia sent at 03/31/2015  3:47 PM EST ----- Regarding: Lab orders for Wednesday, 1.4.17 Patient is scheduled for CPX labs, please order future labs, Thanks , Karna Christmas

## 2015-04-07 ENCOUNTER — Other Ambulatory Visit: Payer: 59 | Admitting: Radiology

## 2015-04-08 ENCOUNTER — Other Ambulatory Visit (INDEPENDENT_AMBULATORY_CARE_PROVIDER_SITE_OTHER): Payer: 59

## 2015-04-08 DIAGNOSIS — Z0000 Encounter for general adult medical examination without abnormal findings: Secondary | ICD-10-CM | POA: Diagnosis not present

## 2015-04-09 LAB — TSH: TSH: 2.35 u[IU]/mL (ref 0.450–4.500)

## 2015-04-09 LAB — CBC WITH DIFFERENTIAL/PLATELET
Basophils Absolute: 0 10*3/uL (ref 0.0–0.2)
Basos: 0 %
EOS (ABSOLUTE): 0.1 10*3/uL (ref 0.0–0.4)
Eos: 1 %
Hematocrit: 39.2 % (ref 34.0–46.6)
Hemoglobin: 13.1 g/dL (ref 11.1–15.9)
Immature Grans (Abs): 0 10*3/uL (ref 0.0–0.1)
Immature Granulocytes: 0 %
Lymphocytes Absolute: 2.8 10*3/uL (ref 0.7–3.1)
Lymphs: 37 %
MCH: 28.5 pg (ref 26.6–33.0)
MCHC: 33.4 g/dL (ref 31.5–35.7)
MCV: 85 fL (ref 79–97)
Monocytes Absolute: 0.6 10*3/uL (ref 0.1–0.9)
Monocytes: 7 %
Neutrophils Absolute: 4.2 10*3/uL (ref 1.4–7.0)
Neutrophils: 55 %
Platelets: 341 10*3/uL (ref 150–379)
RBC: 4.6 x10E6/uL (ref 3.77–5.28)
RDW: 14.8 % (ref 12.3–15.4)
WBC: 7.7 10*3/uL (ref 3.4–10.8)

## 2015-04-09 LAB — COMPREHENSIVE METABOLIC PANEL
ALT: 11 IU/L (ref 0–32)
AST: 17 IU/L (ref 0–40)
Albumin/Globulin Ratio: 1.8 (ref 1.1–2.5)
Albumin: 4.4 g/dL (ref 3.5–5.5)
Alkaline Phosphatase: 68 IU/L (ref 39–117)
BUN/Creatinine Ratio: 13 (ref 9–23)
BUN: 12 mg/dL (ref 6–24)
Bilirubin Total: 0.4 mg/dL (ref 0.0–1.2)
CO2: 23 mmol/L (ref 18–29)
Calcium: 9.8 mg/dL (ref 8.7–10.2)
Chloride: 100 mmol/L (ref 96–106)
Creatinine, Ser: 0.9 mg/dL (ref 0.57–1.00)
GFR calc Af Amer: 86 mL/min/{1.73_m2} (ref >59)
GFR calc non Af Amer: 74 mL/min/{1.73_m2} (ref >59)
Globulin, Total: 2.5 g/dL (ref 1.5–4.5)
Glucose: 103 mg/dL — ABNORMAL HIGH (ref 65–99)
Potassium: 5 mmol/L (ref 3.5–5.2)
Sodium: 138 mmol/L (ref 134–144)
Total Protein: 6.9 g/dL (ref 6.0–8.5)

## 2015-04-09 LAB — LIPID PANEL
Chol/HDL Ratio: 2.5 ratio units (ref 0.0–4.4)
Cholesterol, Total: 229 mg/dL — ABNORMAL HIGH (ref 100–199)
HDL: 91 mg/dL (ref >39)
LDL Calculated: 122 mg/dL — ABNORMAL HIGH (ref 0–99)
Triglycerides: 80 mg/dL (ref 0–149)
VLDL Cholesterol Cal: 16 mg/dL (ref 5–40)

## 2015-04-09 LAB — HEMOGLOBIN A1C
Est. average glucose Bld gHb Est-mCnc: 128 mg/dL
Hgb A1c MFr Bld: 6.1 % — ABNORMAL HIGH (ref 4.8–5.6)

## 2015-04-14 ENCOUNTER — Encounter: Payer: Self-pay | Admitting: Family Medicine

## 2015-04-14 ENCOUNTER — Ambulatory Visit (INDEPENDENT_AMBULATORY_CARE_PROVIDER_SITE_OTHER): Payer: 59 | Admitting: Family Medicine

## 2015-04-14 VITALS — BP 130/80 | HR 68 | Temp 98.2°F | Ht 65.5 in | Wt 173.5 lb

## 2015-04-14 DIAGNOSIS — Z0000 Encounter for general adult medical examination without abnormal findings: Secondary | ICD-10-CM | POA: Diagnosis not present

## 2015-04-14 DIAGNOSIS — F418 Other specified anxiety disorders: Secondary | ICD-10-CM

## 2015-04-14 DIAGNOSIS — Z1159 Encounter for screening for other viral diseases: Secondary | ICD-10-CM | POA: Diagnosis not present

## 2015-04-14 DIAGNOSIS — Z114 Encounter for screening for human immunodeficiency virus [HIV]: Secondary | ICD-10-CM

## 2015-04-14 DIAGNOSIS — R739 Hyperglycemia, unspecified: Secondary | ICD-10-CM

## 2015-04-14 MED ORDER — FLUTICASONE PROPIONATE 50 MCG/ACT NA SUSP: 2.0000 | Freq: Every day | NASAL | Status: DC

## 2015-04-14 NOTE — Progress Notes (Signed)
Pre visit review using our clinic review tool, if applicable. No additional management support is needed unless otherwise documented below in the visit note. 

## 2015-04-14 NOTE — Patient Instructions (Signed)
Keep exercising  Watch out for excess sugar and fat in diet  Lab for Hep C and HIV screening today

## 2015-04-14 NOTE — Progress Notes (Signed)
Subjective:    Patient ID: Peggy Hicks, female    DOB: January 14, 1966, 50 y.o.   MRN: [id number]  HPI Here for health maintenance exam and to review chronic medical problems    Is doing well overall  About the same   Wt is down 3 lb with bmi of 28  Screen for Hep C/HIV- is interested for that  Will draw today   Td 5/07  Mm 7/16 nl  Self exam -no breast lumps   Flu shot 10/16   Pap 2/15 nl with gyn / Dr Phineas Real (and neg HPV) Has appt coming up  Has had fibroid removed - watches that  Her menses is no longer normal  Still coming every month- but later and later (used to have a 28 day cycle-now 34)  Mood has changed also  Working out/exercise helps a lot  Also hot flashes or night sweats   Colonoscopy 1/16 showed some adenomas, has a 5 year recall   Hyperglycemia Lab Results  Component Value Date   HGBA1C 6.1* 04/08/2015   Last was 6.0 Wants to keep an eye on this  In general does not eat a lot of sugar except during her period  She will cut back on juices   Cholesterol Lab Results  Component Value Date   CHOL 229* 04/08/2015   CHOL 194 01/30/2014   CHOL 213* 01/27/2013   Lab Results  Component Value Date   HDL 91 04/08/2015   HDL 88 01/30/2014   HDL 93 01/27/2013   Lab Results  Component Value Date   LDLCALC 122* 04/08/2015   LDLCALC 84 01/30/2014   LDLCALC 103* 01/27/2013   Lab Results  Component Value Date   TRIG 80 04/08/2015   TRIG 109 01/30/2014   TRIG 86 01/27/2013   Lab Results  Component Value Date   CHOLHDL 2.5 04/08/2015   CHOLHDL 2.2 01/30/2014   CHOLHDL 2.3 01/27/2013   No results found for: LDLDIRECT   LDL went up but still at goal  HDL is excellent   Lab Results  Component Value Date   WBC 7.7 04/08/2015   HGB 11.2 01/30/2014   HCT 39.2 04/08/2015   MCV 85 04/08/2015   PLT 341 04/08/2015      Chemistry      Component Value Date/Time   NA 138 04/08/2015 0802   NA 137 05/21/2012 1432   K 5.0 04/08/2015 0802   CL  100 04/08/2015 0802   CO2 23 04/08/2015 0802   BUN 12 04/08/2015 0802   BUN 13 05/21/2012 1432   CREATININE 0.90 04/08/2015 0802   CREATININE 0.81 05/21/2012 1432      Component Value Date/Time   CALCIUM 9.8 04/08/2015 0802   ALKPHOS 68 04/08/2015 0802   AST 17 04/08/2015 0802   ALT 11 04/08/2015 0802   BILITOT 0.4 04/08/2015 0802   BILITOT 0.5 01/30/2014 0849      Lab Results  Component Value Date   TSH 2.350 04/08/2015     Takes xanax as needed for travel- does not take often / travel or public speaking  Has not had a dose in a long time   Refilled in Nov   Patient Active Problem List   Diagnosis Date Noted  . ETD (eustachian tube dysfunction) 02/02/2015  . Allergic rhinitis 02/02/2015  . Hx of adenomatous colonic polyps 04/14/2014  . Mild anemia 02/02/2014  . Costochondritis 07/29/2013  . Vaginitis and vulvovaginitis 01/27/2013  . Left lumbar radiculopathy 12/10/2012  .  Breast density 01/01/2012  . Routine general medical examination at a health care facility 01/01/2012  . Stress reaction 11/13/2011  . Muscle fasciculation 11/13/2011  . Hyperglycemia 11/15/2010  . PRIMARY FOCAL HYPERHIDROSIS 10/22/2007  . TEMPOROMANDIBULAR JOINT DISORDER 01/25/2007  . ANXIETY 08/01/2006   Past Medical History  Diagnosis Date  . Fibroids   . Hyperhidrosis   . Stress reaction   . Hx of adenomatous colonic polyps 04/14/2014   Past Surgical History  Procedure Laterality Date  . Breast surgery      Breast Reduction  . Myomectomy      Uterine fibroids removed/ hysteroscopic submucous myomectomy  . Tubal ligation    . Breast biopsy    . Cesarean section       X 2  . Hysteroscopy     Social History  Substance Use Topics  . Smoking status: Never Smoker   . Smokeless tobacco: Never Used  . Alcohol Use: 0.0 oz/week    0 Standard drinks or equivalent per week     Comment: Rare   Family History  Problem Relation Age of Onset  . Hypertension Mother   . Heart disease  Mother   . Diabetes Mother   . Cancer Maternal Aunt     liver  . Hypertension Father   . Colon cancer Neg Hx   . Esophageal cancer Neg Hx   . Rectal cancer Neg Hx   . Stomach cancer Neg Hx    No Known Allergies Current Outpatient Prescriptions on File Prior to Visit  Medication Sig Dispense Refill  . ALPRAZolam (XANAX) 0.5 MG tablet Take 1 tablet (0.5 mg total) by mouth 2 (two) times daily as needed (anxiety for traveling). 30 tablet 0  . Biotin 10 MG CAPS Take 1 capsule by mouth daily.    Marland Kitchen DICLOFENAC PO Take 75 mg by mouth as needed.    Marland Kitchen ibuprofen (ADVIL,MOTRIN) 600 MG tablet Take 1 tablet (600 mg total) by mouth every 8 (eight) hours as needed. 60 tablet 1  . Multiple Vitamins-Minerals (MULTIVITAMIN PO) Take 1 tablet by mouth daily. Women's multivitamin daily    . NONFORMULARY OR COMPOUNDED ITEM Boric acid suppositories 600 mg #30 one per vagina 3 times weekly as needed for yeast refill x3 30 each 1   No current facility-administered medications on file prior to visit.    Review of Systems    Review of Systems  Constitutional: Negative for fever, appetite change, fatigue and unexpected weight change.  Eyes: Negative for pain and visual disturbance.  Respiratory: Negative for cough and shortness of breath.   Cardiovascular: Negative for cp or palpitations    Gastrointestinal: Negative for nausea, diarrhea and constipation.  Genitourinary: Negative for urgency and frequency. neg for excessive thirst  Skin: Negative for pallor or rash   Neurological: Negative for weakness, light-headedness, numbness and headaches.  Hematological: Negative for adenopathy. Does not bruise/bleed easily.  Psychiatric/Behavioral: Negative for dysphoric mood. The patient is not nervous/anxious.      Objective:   Physical Exam  Constitutional: She appears well-developed and well-nourished. No distress.  Well appearing   HENT:  Head: Normocephalic and atraumatic.  Right Ear: External ear normal.    Left Ear: External ear normal.  Nose: Nose normal.  Mouth/Throat: Oropharynx is clear and moist.  Eyes: Conjunctivae and EOM are normal. Pupils are equal, round, and reactive to light. Right eye exhibits no discharge. Left eye exhibits no discharge. No scleral icterus.  Neck: Normal range of motion. Neck supple. No  JVD present. Carotid bruit is not present. No thyromegaly present.  Cardiovascular: Normal rate, regular rhythm, normal heart sounds and intact distal pulses.  Exam reveals no gallop.   Pulmonary/Chest: Effort normal and breath sounds normal. No respiratory distress. She has no wheezes. She has no rales.  Abdominal: Soft. Bowel sounds are normal. She exhibits no distension and no mass. There is no tenderness.  Musculoskeletal: She exhibits no edema or tenderness.  Lymphadenopathy:    She has no cervical adenopathy.  Neurological: She is alert. She has normal reflexes. No cranial nerve deficit. She exhibits normal muscle tone. Coordination normal.  Skin: Skin is warm and dry. No rash noted. No erythema. No pallor.  Psychiatric: She has a normal mood and affect.          Assessment & Plan:   Problem List Items Addressed This Visit      Other   Hyperglycemia    Lab Results  Component Value Date   HGBA1C 6.1* 04/08/2015   Watching this Stressed imp of low glycemic diet and weight control to prevent DM Will continue to follow       Need for hepatitis C screening test    Screening labs today      Relevant Orders   Hepatitis C Antibody   Routine general medical examination at a health care facility - Primary    Reviewed health habits including diet and exercise and skin cancer prevention Reviewed appropriate screening tests for age  Also reviewed health mt list, fam hx and immunization status , as well as social and family history   See HPI Labs reviewed Keep exercising  Watch out for excess sugar and fat in diet  Lab for Hep C and HIV screening today        Screening for HIV (human immunodeficiency virus)    Screening labs today      Relevant Orders   HIV antibody   Situational anxiety    Uses xanax only for travel and public speaking

## 2015-04-15 NOTE — Assessment & Plan Note (Signed)
Uses xanax only for travel and public speaking

## 2015-04-15 NOTE — Assessment & Plan Note (Signed)
Reviewed health habits including diet and exercise and skin cancer prevention Reviewed appropriate screening tests for age  Also reviewed health mt list, fam hx and immunization status , as well as social and family history   See HPI Labs reviewed Keep exercising  Watch out for excess sugar and fat in diet  Lab for Hep C and HIV screening today

## 2015-04-15 NOTE — Assessment & Plan Note (Signed)
Screening labs today

## 2015-04-15 NOTE — Assessment & Plan Note (Signed)
Lab Results  Component Value Date   HGBA1C 6.1* 04/08/2015   Watching this Stressed imp of low glycemic diet and weight control to prevent DM Will continue to follow

## 2015-04-16 LAB — HIV ANTIBODY (ROUTINE TESTING W REFLEX): HIV Screen 4th Generation wRfx: NONREACTIVE

## 2015-04-16 LAB — HEPATITIS C ANTIBODY: Hep C Virus Ab: <0.1 s/co ratio (ref 0.0–0.9)

## 2015-04-30 ENCOUNTER — Ambulatory Visit (INDEPENDENT_AMBULATORY_CARE_PROVIDER_SITE_OTHER): Payer: 59 | Admitting: Gynecology

## 2015-04-30 ENCOUNTER — Encounter: Payer: Self-pay | Admitting: Gynecology

## 2015-04-30 VITALS — BP 120/76

## 2015-04-30 DIAGNOSIS — N898 Other specified noninflammatory disorders of vagina: Secondary | ICD-10-CM

## 2015-04-30 DIAGNOSIS — L298 Other pruritus: Secondary | ICD-10-CM | POA: Diagnosis not present

## 2015-04-30 LAB — WET PREP FOR TRICH, YEAST, CLUE
Clue Cells Wet Prep HPF POC: NONE SEEN
Trich, Wet Prep: NONE SEEN
Yeast Wet Prep HPF POC: NONE SEEN

## 2015-04-30 MED ORDER — METRONIDAZOLE 500 MG PO TABS: 500.0000 mg | ORAL_TABLET | Freq: Two times a day (BID) | ORAL | Status: DC

## 2015-04-30 NOTE — Patient Instructions (Signed)
Take the Flagyl medication twice daily for 7 days.  Avoid alcohol while taking.  Follow-up if symptoms persist, worsen or recur. 

## 2015-04-30 NOTE — Progress Notes (Signed)
Peggy Hicks Sep 08, 1965 [id number]        50 y.o.  [id number] Presents with several days of vaginal itching and slight discharge. No odor. No frequency dysuria or urgency. Used a boric acid and took a Diflucan but still has symptoms.  Past medical history,surgical history, problem list, medications, allergies, family history and social history were all reviewed and documented in the EPIC chart.  Directed ROS with pertinent positives and negatives documented in the history of present illness/assessment and plan.  Exam: Gilman Schmidt assistant Filed Vitals:   04/30/15 1514  BP: 120/76   General appearance:  Normal Abdomen soft nontender without masses guarding rebound Pelvic external BUS vagina normal with slight white discharge. Cervix normal. Uterus 10 week size irregular consistent with her leiomyoma. Adnexa without masses or tenderness.     Assessment/Plan:  50 y.o. [id number] With irritation andslight discharge. Self treated for yeast without relief.  Wet prep shows moderate bacteria and white cells. No clues or yeast. Will cover as a bacterial given her self treatment for yeast with Flagyl 500 mg twice a day 7 days, alcohol avoidance reviewed. Follow up if symptoms persist, worsen or recur.    Anastasio Auerbach MD, 3:52 PM 04/30/2015

## 2015-05-17 ENCOUNTER — Telehealth: Payer: Self-pay | Admitting: Family Medicine

## 2015-05-17 ENCOUNTER — Emergency Department: Payer: 59

## 2015-05-17 ENCOUNTER — Emergency Department
Admission: EM | Admit: 2015-05-17 | Discharge: 2015-05-18 | Disposition: A | Discharge status: Home / Self Care | Payer: 59 | Attending: Emergency Medicine | Admitting: Emergency Medicine

## 2015-05-17 DIAGNOSIS — R1013 Epigastric pain: Secondary | ICD-10-CM | POA: Diagnosis not present

## 2015-05-17 DIAGNOSIS — Z79899 Other long term (current) drug therapy: Secondary | ICD-10-CM | POA: Insufficient documentation

## 2015-05-17 DIAGNOSIS — Z792 Long term (current) use of antibiotics: Secondary | ICD-10-CM | POA: Diagnosis not present

## 2015-05-17 DIAGNOSIS — R079 Chest pain, unspecified: Secondary | ICD-10-CM | POA: Diagnosis present

## 2015-05-17 LAB — HEPATIC FUNCTION PANEL
ALT: 14 U/L (ref 14–54)
AST: 21 U/L (ref 15–41)
Albumin: 4.1 g/dL (ref 3.5–5.0)
Alkaline Phosphatase: 57 U/L (ref 38–126)
Bilirubin, Direct: <0.1 mg/dL — ABNORMAL LOW (ref 0.1–0.5)
Total Bilirubin: 0.6 mg/dL (ref 0.3–1.2)
Total Protein: 7.4 g/dL (ref 6.5–8.1)

## 2015-05-17 LAB — LIPASE, BLOOD: Lipase: 20 U/L (ref 11–51)

## 2015-05-17 LAB — BASIC METABOLIC PANEL
Anion gap: 10 (ref 5–15)
BUN: 13 mg/dL (ref 6–20)
CO2: 25 mmol/L (ref 22–32)
Calcium: 9.6 mg/dL (ref 8.9–10.3)
Chloride: 101 mmol/L (ref 101–111)
Creatinine, Ser: 0.74 mg/dL (ref 0.44–1.00)
GFR calc Af Amer: >60 mL/min (ref >60)
GFR calc non Af Amer: >60 mL/min (ref >60)
Glucose, Bld: 98 mg/dL (ref 65–99)
Potassium: 4.4 mmol/L (ref 3.5–5.1)
Sodium: 136 mmol/L (ref 135–145)

## 2015-05-17 LAB — CBC
HCT: 40 % (ref 35.0–47.0)
Hemoglobin: 13.3 g/dL (ref 12.0–16.0)
MCH: 28.2 pg (ref 26.0–34.0)
MCHC: 33.2 g/dL (ref 32.0–36.0)
MCV: 85 fL (ref 80.0–100.0)
Platelets: 329 10*3/uL (ref 150–440)
RBC: 4.7 MIL/uL (ref 3.80–5.20)
RDW: 15.7 % — ABNORMAL HIGH (ref 11.5–14.5)
WBC: 8.7 10*3/uL (ref 3.6–11.0)

## 2015-05-17 LAB — TROPONIN I
Troponin I: <0.03 ng/mL (ref <0.031)
Troponin I: 0.03 ng/mL (ref <0.031)

## 2015-05-17 MED ORDER — GI COCKTAIL ~~LOC~~
30.0000 mL | Freq: Once | ORAL | Status: AC
  Administered 2015-05-17: 30 mL via ORAL
  Filled 2015-05-17: qty 30

## 2015-05-17 NOTE — ED Provider Notes (Signed)
[facility] Emergency Department Provider Note  Time seen: 10:23 PM  I have reviewed the triage vital signs and the nursing notes.   HISTORY  Chief Complaint Chest Pain and Jaw Pain    HPI ALAYA KRESSIN is a 50 y.o. female with no past medical history who presents the emergency department for epigastric pain. According to the patient around 2:00 in the morning she awoke with a sensation of epigastric pain radiating up to her throat. States she is able to go back to sleep, but later in the day today she continued to have a slight discomfort and she googled it on the web M.D. which told her to go to the emergency department she could be having a heart attack. Patient denies any shortness of breath, nausea, diaphoresis. Patient states they ate a spicy sausage last night, and her husband awoke with reflux symptoms as well. Patient states a history of heartburn to which this felt somewhat similar but was more severe. States a very slight chest discomfort currently. But denies any "pain." States it was moderate overnight.     Past Medical History  Diagnosis Date  . Fibroids   . Hyperhidrosis   . Stress reaction   . Hx of adenomatous colonic polyps 04/14/2014    Patient Active Problem List   Diagnosis Date Noted  . Need for hepatitis C screening test 04/14/2015  . Screening for HIV (human immunodeficiency virus) 04/14/2015  . Allergic rhinitis 02/02/2015  . Hx of adenomatous colonic polyps 04/14/2014  . Left lumbar radiculopathy 12/10/2012  . Routine general medical examination at a health care facility 01/01/2012  . Stress reaction 11/13/2011  . Muscle fasciculation 11/13/2011  . Hyperglycemia 11/15/2010  . PRIMARY FOCAL HYPERHIDROSIS 10/22/2007  . TEMPOROMANDIBULAR JOINT DISORDER 01/25/2007  . Situational anxiety 08/01/2006    Past Surgical History  Procedure Laterality Date  . Breast surgery      Breast Reduction  . Myomectomy      Uterine fibroids  removed/ hysteroscopic submucous myomectomy  . Tubal ligation    . Breast biopsy    . Cesarean section       X 2  . Hysteroscopy      Current Outpatient Rx  Name  Route  Sig  Dispense  Refill  . ALPRAZolam (XANAX) 0.5 MG tablet   Oral   Take 1 tablet (0.5 mg total) by mouth 2 (two) times daily as needed (anxiety for traveling).   30 tablet   0   . Biotin 10 MG CAPS   Oral   Take 1 capsule by mouth daily.         Marland Kitchen DICLOFENAC PO   Oral   Take 75 mg by mouth as needed.         . fluticasone (FLONASE) 50 MCG/ACT nasal spray   Each Nare   Place 2 sprays into both nostrils daily.   16 g   11   . ibuprofen (ADVIL,MOTRIN) 600 MG tablet   Oral   Take 1 tablet (600 mg total) by mouth every 8 (eight) hours as needed.   60 tablet   1   . metroNIDAZOLE (FLAGYL) 500 MG tablet   Oral   Take 1 tablet (500 mg total) by mouth 2 (two) times daily. For 7 days.  Avoid alcohol while taking   14 tablet   0   . Multiple Vitamins-Minerals (MULTIVITAMIN PO)   Oral   Take 1 tablet by mouth daily. Women's multivitamin daily         .  NONFORMULARY OR COMPOUNDED ITEM      Boric acid suppositories 600 mg #30 one per vagina 3 times weekly as needed for yeast refill x3   30 each   1     Allergies Review of patient's allergies indicates no known allergies.  Family History  Problem Relation Age of Onset  . Hypertension Mother   . Heart disease Mother   . Diabetes Mother   . Cancer Maternal Aunt     liver  . Hypertension Father   . Colon cancer Neg Hx   . Esophageal cancer Neg Hx   . Rectal cancer Neg Hx   . Stomach cancer Neg Hx     Social History Social History  Substance Use Topics  . Smoking status: Never Smoker   . Smokeless tobacco: Never Used  . Alcohol Use: 0.0 oz/week    0 Standard drinks or equivalent per week     Comment: Rare    Review of Systems Constitutional: Negative for fever. Cardiovascular: Positive for chest pain now resolved. Rating to the  throat, now gone. Respiratory: Negative for shortness of breath. Gastrointestinal: Negative for abdominal pain. Negative for nausea, vomiting. Musculoskeletal: Negative for back pain. Neurological: Negative for headache 10-point ROS otherwise negative.  ____________________________________________   PHYSICAL EXAM:  VITAL SIGNS: ED Triage Vitals  Enc Vitals Group     BP 05/17/15 1801 140/92 mmHg     Pulse Rate 05/17/15 1801 74     Resp 05/17/15 1801 14     Temp 05/17/15 1801 98.2 F (36.8 C)     Temp Source 05/17/15 1801 Oral     SpO2 05/17/15 1801 98 %     Weight 05/17/15 1801 172 lb (78.019 kg)     Height 05/17/15 1801 5\' 8"  (1.727 m)     Head Cir --      Peak Flow --      Pain Score 05/17/15 1801 3     Pain Loc --      Pain Edu? --      Excl. in [place]? --     Constitutional: Alert and oriented. Well appearing and in no distress. Eyes: Normal exam ENT   Head: Normocephalic and atraumatic.   Mouth/Throat: Mucous membranes are moist. Cardiovascular: Normal rate, regular rhythm. No murmur Respiratory: Normal respiratory effort without tachypnea nor retractions. Breath sounds are clear Gastrointestinal: Soft and nontender. No distention.  Musculoskeletal: Nontender with normal range of motion in all extremities.  Neurologic:  Normal speech and language. No gross focal neurologic deficits Skin:  Skin is warm, dry and intact.  Psychiatric: Mood and affect are normal. Speech and behavior are normal.  ____________________________________________    EKG  EKG reviewed and interpreted by myself shows normal sinus rhythm at 70 bpm, narrow QS, normal axis, normal intervals, no ST changes. Overall normal EKG.  ____________________________________________    RADIOLOGY  Chest x-ray shows no acute abnormality  ____________________________________________    INITIAL IMPRESSION / ASSESSMENT AND PLAN / ED COURSE  Pertinent labs & imaging results that were available  during my care of the patient were reviewed by me and considered in my medical decision making (see chart for details).  Patient presented to the emergency department with chest discomfort radiating to the throat which occurred overnight. Patient does state a history of heartburn to which this feels similar, but it was more severe. Currently the patient states very mild chest discomfort. We'll try GI cocktail for symptom relief. We will repeat a troponin as well  as lipase and hepatic function panel much were not checked initially. Overall the patient appears very well, very reassuring examination, and a normal EKG. I started discussed with the patient that even if the workup is normal, she needs to follow up with a cardiologist for a stress test. Patient is agreeable to plan.  Patient's repeat troponin is negative. Lipase within normal limits, hepatic function panel negative. Patient states her minimal discomfort she was experiencing resolved after GI cocktail. Highly suspect reflux as a cause of the patient's symptoms. I still discussed following up with cardiology for a stress test as soon as possible, the patient is agreeable.   ____________________________________________   FINAL CLINICAL IMPRESSION(S) / ED DIAGNOSES  Chest pain   Harvest Dark, MD 05/17/15 2333

## 2015-05-17 NOTE — Telephone Encounter (Signed)
Go to UC if she declines ED

## 2015-05-17 NOTE — Telephone Encounter (Signed)
I will see her then  

## 2015-05-17 NOTE — ED Notes (Signed)
Pt to ER by self c/o epigastric pain and jaw pain that began last night. Pt states that she was sleeping when occurred, took 1 baby ASA and back to sleep. Pain began today again. Pt alert and oriented X4, active, cooperative, pt in NAD. RR even and unlabored, color WNL.  No hx of similar pain.

## 2015-05-17 NOTE — Telephone Encounter (Signed)
[facility]  Patient Name: Peggy Hicks  DOB: 02-May-1965    Initial Comment Caller states just started having chest pain    Nurse Assessment  Nurse: Arnetha Courser, RN, Susie Date/Time (Eastern Time): 05/17/2015 4:13:30 PM  Confirm and document reason for call. If symptomatic, describe symptoms. You must click the next button to save text entered. ---chest pain and jaw pain; began last night with one episode; and is occurring now;  Has the patient traveled out of the country within the last 30 days? ---Not Applicable  Does the patient have any new or worsening symptoms? ---Yes  Will a triage be completed? ---Yes  Related visit to physician within the last 2 weeks? ---N/A  Does the PT have any chronic conditions? (i.e. diabetes, asthma, etc.) ---No  Is the patient pregnant or possibly pregnant? (Ask all females between the ages of 10-55) ---No  Is this a behavioral health or substance abuse call? ---No     Guidelines    Guideline Title Affirmed Question Affirmed Notes  Chest Pain Pain also present in shoulder(s) or arm(s) or jaw (Exception: pain is clearly made worse by movement)    Final Disposition User   Go to ED Now [facility], RN, Susie    Comments  Caller refusing treatment at the ED due to potential long wait for care; due to symptoms call advised to go to ER; she stated that she would go to a nearby Urgent Care;   Referrals  GO TO FACILITY UNDECIDED   Disagree/Comply: Disagree  Disagree/Comply Reason: Disagree with instructions

## 2015-05-17 NOTE — Telephone Encounter (Signed)
When spoke with pt she advise me she is at an UC being eval right now

## 2015-05-17 NOTE — Telephone Encounter (Signed)
Pt has appt with Dr Glori Bickers 05/18/15 at 10:15

## 2015-05-17 NOTE — Telephone Encounter (Signed)
[facility] Patient Name: LUCRECIA ABELAR DOB: [birth year] Initial Comment Caller says last night her jaw hurt and has chest pain. She took an aspirin. She is fine right now but wanted to ask if should be seen Nurse Assessment Nurse: Markus Daft, RN, Sherre Poot Date/Time Eilene Ghazi Time): 05/17/2015 3:27:08 PM Confirm and document reason for call. If symptomatic, describe symptoms. You must click the next button to save text entered. ---Caller states last night around 2 or 3 am, she woke from sleep with chest and jaw pain. She went to back to sleep within 5 min. She has been having hot flashes and sweating. Able to sleep the rest of the night. She took an aspirin 81 mg this AM. She is fine right now, but wanted to ask if should be seen. Has the patient traveled out of the country within the last 30 days? ---Not Applicable Does the patient have any new or worsening symptoms? ---Yes Will a triage be completed? ---Yes Related visit to physician within the last 2 weeks? ---No Does the PT have any chronic conditions? (i.e. diabetes, asthma, etc.) ---No Is the patient pregnant or possibly pregnant? (Ask all females between the ages of 23-55) ---No Is this a behavioral health or substance abuse call? ---No Guidelines Guideline Title Affirmed Question Affirmed Notes Chest Pain [1] Chest pain lasting <= 5 minutes AND [2] NO chest pain or cardiac symptoms now (Exceptions: pains lasting a few seconds) Final Disposition User See Physician within [address] Hours Story, RN, Windy Comments Appt with Dr. Glori Bickers for tomorrow set at 10:15 am Disagree/Comply: Comply

## 2015-05-17 NOTE — Discharge Instructions (Signed)
You have been seen in the emergency department today for chest pain. Your workup has shown normal results. As we discussed please follow-up with your primary care physician in the next 1-2 days for recheck. Return to the emergency department for any further chest pain, trouble breathing, or any other symptom personally concerning to yourself. Please call cardiology to arrange a follow-up stress test as soon as possible.   Nonspecific Chest Pain It is often hard to find the cause of chest pain. There is always a chance that your pain could be related to something serious, such as a heart attack or a blood clot in your lungs. Chest pain can also be caused by conditions that are not life-threatening. If you have chest pain, it is very important to follow up with your doctor.  HOME CARE  If you were prescribed an antibiotic medicine, finish it all even if you start to feel better.  Avoid any activities that cause chest pain.  Do not use any tobacco products, including cigarettes, chewing tobacco, or electronic cigarettes. If you need help quitting, ask your doctor.  Do not drink alcohol.  Take medicines only as told by your doctor.  Keep all follow-up visits as told by your doctor. This is important. This includes any further testing if your chest pain does not go away.  Your doctor may tell you to keep your head raised (elevated) while you sleep.  Make lifestyle changes as told by your doctor. These may include:  Getting regular exercise. Ask your doctor to suggest some activities that are safe for you.  Eating a heart-healthy diet. Your doctor or a diet specialist (dietitian) can help you to learn healthy eating options.  Maintaining a healthy weight.  Managing diabetes, if necessary.  Reducing stress. GET HELP IF:  Your chest pain does not go away, even after treatment.  You have a rash with blisters on your chest.  You have a fever. GET HELP RIGHT AWAY IF:  Your chest pain  is worse.  You have an increasing cough, or you cough up blood.  You have severe belly (abdominal) pain.  You feel extremely weak.  You pass out (faint).  You have chills.  You have sudden, unexplained chest discomfort.  You have sudden, unexplained discomfort in your arms, back, neck, or jaw.  You have shortness of breath at any time.  You suddenly start to sweat, or your skin gets clammy.  You feel nauseous.  You vomit.  You suddenly feel light-headed or dizzy.  Your heart begins to beat quickly, or it feels like it is skipping beats. These symptoms may be an emergency. Do not wait to see if the symptoms will go away. Get medical help right away. Call your local emergency services (911 in the [place] Do not drive yourself to the hospital.   This information is not intended to replace advice given to you by your health care provider. Make sure you discuss any questions you have with your health care provider.   Document Released: 09/06/2007 Document Revised: 04/10/2014 Document Reviewed: 10/24/2013 Elsevier Interactive Patient Education [employer].

## 2015-05-18 ENCOUNTER — Ambulatory Visit: Payer: Self-pay | Admitting: Family Medicine

## 2015-05-18 ENCOUNTER — Telehealth: Payer: Self-pay | Admitting: Cardiovascular Disease

## 2015-05-18 NOTE — Telephone Encounter (Signed)
lmov to schedule ED fu from 05/17/15

## 2015-05-18 NOTE — ED Notes (Signed)
Patient with no complaints at this time. Respirations even and unlabored. Skin warm/dry. Discharge instructions reviewed with patient at this time. Patient given opportunity to voice concerns/ask questions. Patient discharged at this time and left Emergency Department with steady gait.   

## 2015-05-20 ENCOUNTER — Encounter: Payer: Self-pay | Admitting: Cardiovascular Disease

## 2015-05-20 NOTE — Telephone Encounter (Signed)
Sent letter to make ED FU 2/13/7

## 2015-05-27 ENCOUNTER — Ambulatory Visit (INDEPENDENT_AMBULATORY_CARE_PROVIDER_SITE_OTHER): Payer: 59 | Admitting: Gynecology

## 2015-05-27 ENCOUNTER — Encounter: Payer: Self-pay | Admitting: Gynecology

## 2015-05-27 VITALS — BP 122/76 | Ht 66.0 in | Wt 171.0 lb

## 2015-05-27 DIAGNOSIS — N951 Menopausal and female climacteric states: Secondary | ICD-10-CM

## 2015-05-27 DIAGNOSIS — D251 Intramural leiomyoma of uterus: Secondary | ICD-10-CM

## 2015-05-27 DIAGNOSIS — Z01419 Encounter for gynecological examination (general) (routine) without abnormal findings: Secondary | ICD-10-CM | POA: Diagnosis not present

## 2015-05-27 NOTE — Patient Instructions (Signed)
[facility] is the pharmacy that makes the boric acid suppositories.  You may obtain a copy of any labs that were done today by logging onto MyChart as outlined in the instructions provided with your AVS (after visit summary). The office will not call with normal lab results but certainly if there are any significant abnormalities then we will contact you.   Health Maintenance Adopting a healthy lifestyle and getting preventive care can go a long way to promote health and wellness. Talk with your health care provider about what schedule of regular examinations is right for you. This is a good chance for you to check in with your provider about disease prevention and staying healthy. In between checkups, there are plenty of things you can do on your own. Experts have done a lot of research about which lifestyle changes and preventive measures are most likely to keep you healthy. Ask your health care provider for more information. WEIGHT AND DIET  Eat a healthy diet  Be sure to include plenty of vegetables, fruits, low-fat dairy products, and lean protein.  Do not eat a lot of foods high in solid fats, added sugars, or salt.  Get regular exercise. This is one of the most important things you can do for your health.  Most adults should exercise for at least 150 minutes each week. The exercise should increase your heart rate and make you sweat (moderate-intensity exercise).  Most adults should also do strengthening exercises at least twice a week. This is in addition to the moderate-intensity exercise.  Maintain a healthy weight  Body mass index (BMI) is a measurement that can be used to identify possible weight problems. It estimates body fat based on height and weight. Your health care provider can help determine your BMI and help you achieve or maintain a healthy weight.  For females 89 years of age and older:   A BMI below 18.5 is considered underweight.  A BMI of 18.5 to 24.9  is normal.  A BMI of 25 to 29.9 is considered overweight.  A BMI of 30 and above is considered obese.  Watch levels of cholesterol and blood lipids  You should start having your blood tested for lipids and cholesterol at 52 years of age, then have this test every 5 years.  You may need to have your cholesterol levels checked more often if:  Your lipid or cholesterol levels are high.  You are older than 52 years of age.  You are at high risk for heart disease.  CANCER SCREENING   Lung Cancer  Lung cancer screening is recommended for adults 78-5 years old who are at high risk for lung cancer because of a history of smoking.  A yearly low-dose CT scan of the lungs is recommended for people who:  Currently smoke.  Have quit within the past 15 years.  Have at least a 30-pack-year history of smoking. A pack year is smoking an average of one pack of cigarettes a day for 1 year.  Yearly screening should continue until it has been 15 years since you quit.  Yearly screening should stop if you develop a health problem that would prevent you from having lung cancer treatment.  Breast Cancer  Practice breast self-awareness. This means understanding how your breasts normally appear and feel.  It also means doing regular breast self-exams. Let your health care provider know about any changes, no matter how small.  If you are in your 20s or 30s, you should  have a clinical breast exam (CBE) by a health care provider every 1-3 years as part of a regular health exam.  If you are 65 or older, have a CBE every year. Also consider having a breast X-ray (mammogram) every year.  If you have a family history of breast cancer, talk to your health care provider about genetic screening.  If you are at high risk for breast cancer, talk to your health care provider about having an MRI and a mammogram every year.  Breast cancer gene (BRCA) assessment is recommended for women who have family  members with BRCA-related cancers. BRCA-related cancers include:  Breast.  Ovarian.  Tubal.  Peritoneal cancers.  Results of the assessment will determine the need for genetic counseling and BRCA1 and BRCA2 testing. Cervical Cancer Routine pelvic examinations to screen for cervical cancer are no longer recommended for nonpregnant women who are considered low risk for cancer of the pelvic organs (ovaries, uterus, and vagina) and who do not have symptoms. A pelvic examination may be necessary if you have symptoms including those associated with pelvic infections. Ask your health care provider if a screening pelvic exam is right for you.   The Pap test is the screening test for cervical cancer for women who are considered at risk.  If you had a hysterectomy for a problem that was not cancer or a condition that could lead to cancer, then you no longer need Pap tests.  If you are older than 65 years, and you have had normal Pap tests for the past 10 years, you no longer need to have Pap tests.  If you have had past treatment for cervical cancer or a condition that could lead to cancer, you need Pap tests and screening for cancer for at least 20 years after your treatment.  If you no longer get a Pap test, assess your risk factors if they change (such as having a new sexual partner). This can affect whether you should start being screened again.  Some women have medical problems that increase their chance of getting cervical cancer. If this is the case for you, your health care provider may recommend more frequent screening and Pap tests.  The human papillomavirus (HPV) test is another test that may be used for cervical cancer screening. The HPV test looks for the virus that can cause cell changes in the cervix. The cells collected during the Pap test can be tested for HPV.  The HPV test can be used to screen women 21 years of age and older. Getting tested for HPV can extend the interval  between normal Pap tests from three to five years.  An HPV test also should be used to screen women of any age who have unclear Pap test results.  After 52 years of age, women should have HPV testing as often as Pap tests.  Colorectal Cancer  This type of cancer can be detected and often prevented.  Routine colorectal cancer screening usually begins at 52 years of age and continues through 52 years of age.  Your health care provider may recommend screening at an earlier age if you have risk factors for colon cancer.  Your health care provider may also recommend using home test kits to check for hidden blood in the stool.  A small camera at the end of a tube can be used to examine your colon directly (sigmoidoscopy or colonoscopy). This is done to check for the earliest forms of colorectal cancer.  Routine screening usually  begins at age 68.  Direct examination of the colon should be repeated every 5-10 years through 52 years of age. However, you may need to be screened more often if early forms of precancerous polyps or small growths are found. Skin Cancer  Check your skin from head to toe regularly.  Tell your health care provider about any new moles or changes in moles, especially if there is a change in a mole's shape or color.  Also tell your health care provider if you have a mole that is larger than the size of a pencil eraser.  Always use sunscreen. Apply sunscreen liberally and repeatedly throughout the day.  Protect yourself by wearing long sleeves, pants, a wide-brimmed hat, and sunglasses whenever you are outside. HEART DISEASE, DIABETES, AND HIGH BLOOD PRESSURE   Have your blood pressure checked at least every 1-2 years. High blood pressure causes heart disease and increases the risk of stroke.  If you are between 48 years and 5 years old, ask your health care provider if you should take aspirin to prevent strokes.  Have regular diabetes screenings. This involves  taking a blood sample to check your fasting blood sugar level.  If you are at a normal weight and have a low risk for diabetes, have this test once every three years after 52 years of age.  If you are overweight and have a high risk for diabetes, consider being tested at a younger age or more often. PREVENTING INFECTION  Hepatitis B  If you have a higher risk for hepatitis B, you should be screened for this virus. You are considered at high risk for hepatitis B if:  You were born in a country where hepatitis B is common. Ask your health care provider which countries are considered high risk.  Your parents were born in a high-risk country, and you have not been immunized against hepatitis B (hepatitis B vaccine).  You have HIV or AIDS.  You use needles to inject street drugs.  You live with someone who has hepatitis B.  You have had sex with someone who has hepatitis B.  You get hemodialysis treatment.  You take certain medicines for conditions, including cancer, organ transplantation, and autoimmune conditions. Hepatitis C  Blood testing is recommended for:  Everyone born from [birth year] through 1967.  Anyone with known risk factors for hepatitis C. Sexually transmitted infections (STIs)  You should be screened for sexually transmitted infections (STIs) including gonorrhea and chlamydia if:  You are sexually active and are younger than 52 years of age.  You are older than 52 years of age and your health care provider tells you that you are at risk for this type of infection.  Your sexual activity has changed since you were last screened and you are at an increased risk for chlamydia or gonorrhea. Ask your health care provider if you are at risk.  If you do not have HIV, but are at risk, it may be recommended that you take a prescription medicine daily to prevent HIV infection. This is called pre-exposure prophylaxis (PrEP). You are considered at risk if:  You are sexually active  and do not regularly use condoms or know the HIV status of your partner(s).  You take drugs by injection.  You are sexually active with a partner who has HIV. Talk with your health care provider about whether you are at high risk of being infected with HIV. If you choose to begin PrEP, you should first be tested for  HIV. You should then be tested every 3 months for as long as you are taking PrEP.  PREGNANCY   If you are premenopausal and you may become pregnant, ask your health care provider about preconception counseling.  If you may become pregnant, take 400 to 800 micrograms (mcg) of folic acid every day.  If you want to prevent pregnancy, talk to your health care provider about birth control (contraception). OSTEOPOROSIS AND MENOPAUSE   Osteoporosis is a disease in which the bones lose minerals and strength with aging. This can result in serious bone fractures. Your risk for osteoporosis can be identified using a bone density scan.  If you are 71 years of age or older, or if you are at risk for osteoporosis and fractures, ask your health care provider if you should be screened.  Ask your health care provider whether you should take a calcium or vitamin D supplement to lower your risk for osteoporosis.  Menopause may have certain physical symptoms and risks.  Hormone replacement therapy may reduce some of these symptoms and risks. Talk to your health care provider about whether hormone replacement therapy is right for you.  HOME CARE INSTRUCTIONS   Schedule regular health, dental, and eye exams.  Stay current with your immunizations.   Do not use any tobacco products including cigarettes, chewing tobacco, or electronic cigarettes.  If you are pregnant, do not drink alcohol.  If you are breastfeeding, limit how much and how often you drink alcohol.  Limit alcohol intake to no more than 1 drink per day for nonpregnant women. One drink equals 12 ounces of beer, 5 ounces of wine,  or 1 ounces of hard liquor.  Do not use street drugs.  Do not share needles.  Ask your health care provider for help if you need support or information about quitting drugs.  Tell your health care provider if you often feel depressed.  Tell your health care provider if you have ever been abused or do not feel safe at home. Document Released: 10/03/2010 Document Revised: 08/04/2013 Document Reviewed: 02/19/2013 [facility] Patient Information 2015 [place], [place]. This information is not intended to replace advice given to you by your health care provider. Make sure you discuss any questions you have with your health care provider.

## 2015-05-27 NOTE — Progress Notes (Signed)
Peggy Hicks [birth year] [id number]        49 y.o.  G2P2002  for annual exam. Doing well. Several issues noted below.  Past medical history,surgical history, problem list, medications, allergies, family history and social history were all reviewed and documented as reviewed in the EPIC chart.  ROS:  Performed with pertinent positives and negatives included in the history, assessment and plan.   Additional significant findings :  none   Exam: [name] assistant Filed Vitals:   05/27/15 1612  BP: 122/76  Height: 5\' 6"  (1.676 m)  Weight: 171 lb (77.565 kg)   General appearance:  Normal affect, orientation and appearance. Skin: Grossly normal HEENT: Without gross lesions.  No cervical or supraclavicular adenopathy. Thyroid normal.  Lungs:  Clear without wheezing, rales or rhonchi Cardiac: RR, without RMG Abdominal:  Soft, nontender, without masses, guarding, rebound, organomegaly or hernia Breasts:  Examined lying and sitting without masses, retractions, discharge or axillary adenopathy. Pelvic:  Ext/BUS/vagina normal  Cervix normal  Uterus 14 weeks size irregular consistent with history of leiomyomar   Adnexa without masses or tenderness    Anus and perineum normal   Rectovaginal normal sphincter tone without palpated masses or tenderness.    Assessment/Plan:  50 y.o. G44P2002 female for annual exam with regular menses, tubal sterilization.   1. Continues with regular menses although they have spaced out to 34 days. Is having some mild hot flushes. Will keep menstrual calendar and symptom calendar. If menopausal symptoms worsen and she wants to discuss treatment options she'll represent. If prolonged or atypical bleeding she knows to call. Otherwise at less frequent but regular menses when occurs will follow. 2. Mammography 10/2014. Continue with annual mammography when due. SBE monthly reviewed. 3. Pap smear/HPV2/2015 negative. No Pap smear done today.  No history of significant  abnormal Pap smears. 4. Colonoscopy 2016. Repeat at their recommended interval. 5. Health maintenance. No routine blood work done as this is done at her primary physician's office. Follow up 1 year, sooner as needed.   Anastasio Auerbach MD, 4:54 PM 05/27/2015

## 2015-07-03 ENCOUNTER — Other Ambulatory Visit: Payer: Self-pay | Admitting: Family Medicine

## 2015-07-05 NOTE — Telephone Encounter (Signed)
Rx called in as prescribed 

## 2015-07-05 NOTE — Telephone Encounter (Signed)
Pt had CPE on 04/14/15, last refilled on 02/02/15 #30 with 0 refills, please advise

## 2015-07-05 NOTE — Telephone Encounter (Signed)
Px written for call in   

## 2015-07-05 NOTE — Telephone Encounter (Signed)
Called pharmacy 3x and phone just rings a few times and then hangs up, no answer at all

## 2015-07-14 ENCOUNTER — Other Ambulatory Visit: Payer: Self-pay | Admitting: Gynecology

## 2015-08-26 ENCOUNTER — Encounter: Payer: Self-pay | Admitting: Gynecology

## 2015-08-26 ENCOUNTER — Ambulatory Visit (INDEPENDENT_AMBULATORY_CARE_PROVIDER_SITE_OTHER): Payer: 59 | Admitting: Gynecology

## 2015-08-26 VITALS — BP 126/80

## 2015-08-26 DIAGNOSIS — N3001 Acute cystitis with hematuria: Secondary | ICD-10-CM | POA: Diagnosis not present

## 2015-08-26 DIAGNOSIS — L298 Other pruritus: Secondary | ICD-10-CM | POA: Diagnosis not present

## 2015-08-26 DIAGNOSIS — N898 Other specified noninflammatory disorders of vagina: Secondary | ICD-10-CM

## 2015-08-26 DIAGNOSIS — R300 Dysuria: Secondary | ICD-10-CM

## 2015-08-26 LAB — URINALYSIS W MICROSCOPIC + REFLEX CULTURE
Bilirubin Urine: NEGATIVE
Casts: NONE SEEN [LPF]
Crystals: NONE SEEN [HPF]
Glucose, UA: NEGATIVE
Ketones, ur: NEGATIVE
Nitrite: NEGATIVE
Protein, ur: NEGATIVE
Specific Gravity, Urine: <1.005 (ref 1.001–1.035)
WBC, UA: >60 WBC/HPF — AB (ref <=5)
Yeast: NONE SEEN [HPF]
pH: 6.5 (ref 5.0–8.0)

## 2015-08-26 LAB — WET PREP FOR TRICH, YEAST, CLUE
Clue Cells Wet Prep HPF POC: NONE SEEN
Trich, Wet Prep: NONE SEEN
Yeast Wet Prep HPF POC: NONE SEEN

## 2015-08-26 MED ORDER — PHENAZOPYRIDINE HCL 200 MG PO TABS: 200.0000 mg | ORAL_TABLET | Freq: Three times a day (TID) | ORAL | Status: DC | PRN

## 2015-08-26 MED ORDER — FLUCONAZOLE 150 MG PO TABS: 150.0000 mg | ORAL_TABLET | Freq: Once | ORAL | Status: DC

## 2015-08-26 MED ORDER — CIPROFLOXACIN HCL 250 MG PO TABS: 250.0000 mg | ORAL_TABLET | Freq: Two times a day (BID) | ORAL | Status: DC

## 2015-08-26 NOTE — Patient Instructions (Addendum)
Phenazopyridine tablets  What is this medicine?  PHENAZOPYRIDINE (fen az oh PEER i deen) is a pain reliever. It is used to stop the pain, burning, or discomfort caused by infection or irritation of the urinary tract. This medicine is not an antibiotic. It will not cure a urinary tract infection.  This medicine may be used for other purposes; ask your health care provider or pharmacist if you have questions.  What should I tell my health care provider before I take this medicine?  They need to know if you have any of these conditions:  -glucose-6-phosphate dehydrogenase (G6PD) deficiency  -kidney disease  -an unusual or allergic reaction to phenazopyridine, other medicines, foods, dyes, or preservatives  -pregnant or trying to get pregnant  -breast-feeding  How should I use this medicine?  Take this medicine by mouth with a glass of water. Follow the directions on the prescription label. Take after meals. Take your doses at regular intervals. Do not take your medicine more often than directed. Do not skip doses or stop your medicine early even if you feel better. Do not stop taking except on your doctor's advice.  Talk to your pediatrician regarding the use of this medicine in children. Special care may be needed.  Overdosage: If you think you have taken too much of this medicine contact a poison control center or emergency room at once.  NOTE: This medicine is only for you. Do not share this medicine with others.  What if I miss a dose?  If you miss a dose, take it as soon as you can. If it is almost time for your next dose, take only that dose. Do not take double or extra doses.  What may interact with this medicine?  Interactions are not expected.  This list may not describe all possible interactions. Give your health care provider a list of all the medicines, herbs, non-prescription drugs, or dietary supplements you use. Also tell them if you smoke, drink alcohol, or use illegal drugs. Some items may interact  with your medicine.  What should I watch for while using this medicine?  Tell your doctor or health care professional if your symptoms do not improve or if they get worse.  This medicine colors body fluids red. This effect is harmless and will go away after you are done taking the medicine. It will change urine to an dark orange or red color. The red color may stain clothing. Soft contact lenses may become permanently stained. It is best not to wear soft contact lenses while taking this medicine.  If you are diabetic you may get a false positive result for sugar in your urine. Talk to your health care provider.  What side effects may I notice from receiving this medicine?  Side effects that you should report to your doctor or health care professional as soon as possible:  -allergic reactions like skin rash, itching or hives, swelling of the face, lips, or tongue  -blue or purple color of the skin  -difficulty breathing  -fever  -less urine  -unusual bleeding, bruising  -unusual tired, weak  -vomiting  -yellowing of the eyes or skin  Side effects that usually do not require medical attention (report to your doctor or health care professional if they continue or are bothersome):  -dark urine  -headache  -stomach upset  This list may not describe all possible side effects. Call your doctor for medical advice about side effects. You may report side effects to FDA at 1-800-FDA-1088.    expiration date. NOTE: This sheet is a summary. It may not cover all possible information. If you have questions about this medicine, talk to your doctor, pharmacist, or health care provider.    2016, Elsevier/Gold Standard. (2007-10-17 11:04:07) Urinary Tract Infection Urinary tract infections (UTIs) can develop  anywhere along your urinary tract. Your urinary tract is your body's drainage system for removing wastes and extra water. Your urinary tract includes two kidneys, two ureters, a bladder, and a urethra. Your kidneys are a pair of bean-shaped organs. Each kidney is about the size of your fist. They are located below your ribs, one on each side of your spine. CAUSES Infections are caused by microbes, which are microscopic organisms, including fungi, viruses, and bacteria. These organisms are so small that they can only be seen through a microscope. Bacteria are the microbes that most commonly cause UTIs. SYMPTOMS  Symptoms of UTIs may vary by age and gender of the patient and by the location of the infection. Symptoms in young women typically include a frequent and intense urge to urinate and a painful, burning feeling in the bladder or urethra during urination. Older women and men are more likely to be tired, shaky, and weak and have muscle aches and abdominal pain. A fever may mean the infection is in your kidneys. Other symptoms of a kidney infection include pain in your back or sides below the ribs, nausea, and vomiting. DIAGNOSIS To diagnose a UTI, your caregiver will ask you about your symptoms. Your caregiver will also ask you to provide a urine sample. The urine sample will be tested for bacteria and white blood cells. White blood cells are made by your body to help fight infection. TREATMENT  Typically, UTIs can be treated with medication. Because most UTIs are caused by a bacterial infection, they usually can be treated with the use of antibiotics. The choice of antibiotic and length of treatment depend on your symptoms and the type of bacteria causing your infection. HOME CARE INSTRUCTIONS  If you were prescribed antibiotics, take them exactly as your caregiver instructs you. Finish the medication even if you feel better after you have only taken some of the medication.  Drink enough water and  fluids to keep your urine clear or pale yellow.  Avoid caffeine, tea, and carbonated beverages. They tend to irritate your bladder.  Empty your bladder often. Avoid holding urine for long periods of time.  Empty your bladder before and after sexual intercourse.  After a bowel movement, women should cleanse from front to back. Use each tissue only once. SEEK MEDICAL CARE IF:   You have back pain.  You develop a fever.  Your symptoms do not begin to resolve within 3 days. SEEK IMMEDIATE MEDICAL CARE IF:   You have severe back pain or lower abdominal pain.  You develop chills.  You have nausea or vomiting.  You have continued burning or discomfort with urination. MAKE SURE YOU:   Understand these instructions.  Will watch your condition.  Will get help right away if you are not doing well or get worse.   This information is not intended to replace advice given to you by your health care provider. Make sure you discuss any questions you have with your health care provider.   Document Released: 12/28/2004 Document Revised: 12/09/2014 Document Reviewed: 04/28/2011 Elsevier Interactive Patient Education 2016 [employer] Ciprofloxacin tablets What is this medicine? CIPROFLOXACIN (sip roe FLOX a sin) is a quinolone antibiotic. It is used to treat  certain kinds of bacterial infections. It will not work for colds, flu, or other viral infections. This medicine may be used for other purposes; ask your health care provider or pharmacist if you have questions. What should I tell my health care provider before I take this medicine? They need to know if you have any of these conditions: -bone problems -cerebral disease -history of low levels of potassium in the blood -joint problems -irregular heartbeat -kidney disease -myasthenia gravis -seizures -tendon problems -tingling of the fingers or toes, or other nerve disorder -an unusual or allergic reaction to ciprofloxacin, other  antibiotics or medicines, foods, dyes, or preservatives -pregnant or trying to get pregnant -breast-feeding How should I use this medicine? Take this medicine by mouth with a glass of water. Follow the directions on the prescription label. Take your medicine at regular intervals. Do not take your medicine more often than directed. Take all of your medicine as directed even if you think your are better. Do not skip doses or stop your medicine early. You can take this medicine with food or on an empty stomach. It can be taken with a meal that contains dairy or calcium, but do not take it alone with a dairy product, like milk or yogurt or calcium-fortified juice. A special MedGuide will be given to you by the pharmacist with each prescription and refill. Be sure to read this information carefully each time. Talk to your pediatrician regarding the use of this medicine in children. Special care may be needed. Overdosage: If you think you have taken too much of this medicine contact a poison control center or emergency room at once. NOTE: This medicine is only for you. Do not share this medicine with others. What if I miss a dose? If you miss a dose, take it as soon as you can. If it is almost time for your next dose, take only that dose. Do not take double or extra doses. What may interact with this medicine? Do not take this medicine with any of the following medications: -cisapride -droperidol -terfenadine -tizanidine This medicine may also interact with the following medications: -antacids -birth control pills -caffeine -cyclosporin -didanosine (ddI) buffered tablets or powder -medicines for diabetes -medicines for inflammation like ibuprofen, naproxen -methotrexate -multivitamins -omeprazole -phenytoin -probenecid -sucralfate -theophylline -warfarin This list may not describe all possible interactions. Give your health care provider a list of all the medicines, herbs,  non-prescription drugs, or dietary supplements you use. Also tell them if you smoke, drink alcohol, or use illegal drugs. Some items may interact with your medicine. What should I watch for while using this medicine? Tell your doctor or health care professional if your symptoms do not improve. Do not treat diarrhea with over the counter products. Contact your doctor if you have diarrhea that lasts more than 2 days or if it is severe and watery. You may get drowsy or dizzy. Do not drive, use machinery, or do anything that needs mental alertness until you know how this medicine affects you. Do not stand or sit up quickly, especially if you are an older patient. This reduces the risk of dizzy or fainting spells. This medicine can make you more sensitive to the sun. Keep out of the sun. If you cannot avoid being in the sun, wear protective clothing and use sunscreen. Do not use sun lamps or tanning beds/booths. Avoid antacids, aluminum, calcium, iron, magnesium, and zinc products for 6 hours before and 2 hours after taking a dose of this medicine.  What side effects may I notice from receiving this medicine? Side effects that you should report to your doctor or health care professional as soon as possible: -allergic reactions like skin rash or hives, swelling of the face, lips, or tongue -anxious -confusion -depressed mood -diarrhea -fast, irregular heartbeat -hallucination, loss of contact with reality -joint, muscle, or tendon pain or swelling -pain, tingling, numbness in the hands or feet -suicidal thoughts or other mood changes -sunburn -unusually weak or tired Side effects that usually do not require medical attention (report to your doctor or health care professional if they continue or are bothersome): -dry mouth -headache -nausea -trouble sleeping This list may not describe all possible side effects. Call your doctor for medical advice about side effects. You may report side effects to  FDA at 1-800-FDA-1088. Where should I keep my medicine? Keep out of the reach of children. Store at room temperature below 30 degrees C (86 degrees F). Keep container tightly closed. Throw away any unused medicine after the expiration date. NOTE: This sheet is a summary. It may not cover all possible information. If you have questions about this medicine, talk to your doctor, pharmacist, or health care provider.    2016, Elsevier/Gold Standard. (2014-10-29 12:57:02)

## 2015-08-26 NOTE — Progress Notes (Signed)
   HPI: 50 year old that presented to the office today complaining several days of dysuria and frequency. Patient denied any fever, chills, nausea or vomiting. Patient states that she is susceptible antibiotics whereas she develops yeast infections. She did not try any over-the-counter home remedies. Patient denied any GI complaints. Patient denied any back pains or any change in appetite. Patient sexually active prior tubal ligation on no hormone replacement therapy at this time. Still menstruating on a regular basis. Patient was complaining of some vulvar irritation especially after voiding.  ROS: A ROS was performed and pertinent positives and negatives are included in the history.  GENERAL: No fevers or chills. HEENT: No change in vision, no earache, sore throat or sinus congestion. NECK: No pain or stiffness. CARDIOVASCULAR: No chest pain or pressure. No palpitations. PULMONARY: No shortness of breath, cough or wheeze. GASTROINTESTINAL: No abdominal pain, nausea, vomiting or diarrhea, melena or bright red blood per rectum. GENITOURINARY: No urinary frequency, urgency, hesitancy or dysuria. MUSCULOSKELETAL: No joint or muscle pain, no back pain, no recent trauma. DERMATOLOGIC: No rash, no itching, no lesions. ENDOCRINE: No polyuria, polydipsia, no heat or cold intolerance. No recent change in weight. HEMATOLOGICAL: No anemia or easy bruising or bleeding. NEUROLOGIC: No headache, seizures, numbness, tingling or weakness. PSYCHIATRIC: No depression, no loss of interest in normal activity or change in sleep pattern.   PE: Blood pressure 126/80 Gen. 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 Patient was encouraged to increase her fluid intake. If she develops any fever, chills, nausea or vomiting her back pain she should report to the office immediately or to the emergency room after hours. Because of her sensitivity to antibiotics and develops yeast infections I've also provided her with a prescription for Diflucan 150 mg one by mouth when necessary..    Greater than 50% of time was spent in counseling and coordinating care of this patient.   Time of consultation: 15   Minutes.

## 2015-08-29 LAB — URINE CULTURE: Colony Count: >=100000

## 2015-08-31 ENCOUNTER — Encounter: Payer: Self-pay | Admitting: Gynecology

## 2015-08-31 ENCOUNTER — Other Ambulatory Visit: Payer: Self-pay | Admitting: Gynecology

## 2015-08-31 MED ORDER — CIPROFLOXACIN HCL 250 MG PO TABS: 250.0000 mg | ORAL_TABLET | Freq: Two times a day (BID) | ORAL | Status: DC

## 2015-09-06 ENCOUNTER — Encounter: Payer: Self-pay | Admitting: Gynecology

## 2015-09-06 ENCOUNTER — Other Ambulatory Visit: Payer: Self-pay | Admitting: Gynecology

## 2015-09-06 MED ORDER — CIPROFLOXACIN HCL 250 MG PO TABS: 250.0000 mg | ORAL_TABLET | Freq: Two times a day (BID) | ORAL | Status: DC

## 2015-09-06 NOTE — Telephone Encounter (Signed)
Dr. Moshe Salisbury, This is patient who has done two rounds of Cipro 250 tid x 3 days. Symptoms persisted after first Rx and you recommended a second Rx of it.

## 2015-09-14 ENCOUNTER — Ambulatory Visit (INDEPENDENT_AMBULATORY_CARE_PROVIDER_SITE_OTHER): Payer: 59 | Admitting: Women's Health

## 2015-09-14 ENCOUNTER — Encounter: Payer: Self-pay | Admitting: Women's Health

## 2015-09-14 VITALS — BP 130/78 | Ht 66.0 in | Wt 171.0 lb

## 2015-09-14 DIAGNOSIS — B373 Candidiasis of vulva and vagina: Secondary | ICD-10-CM | POA: Diagnosis not present

## 2015-09-14 DIAGNOSIS — R350 Frequency of micturition: Secondary | ICD-10-CM | POA: Diagnosis not present

## 2015-09-14 DIAGNOSIS — B3731 Acute candidiasis of vulva and vagina: Secondary | ICD-10-CM

## 2015-09-14 LAB — URINALYSIS W MICROSCOPIC + REFLEX CULTURE
Bilirubin Urine: NEGATIVE
Casts: NONE SEEN [LPF]
Crystals: NONE SEEN [HPF]
Glucose, UA: NEGATIVE
Hgb urine dipstick: NEGATIVE
Ketones, ur: NEGATIVE
Leukocytes, UA: NEGATIVE
Nitrite: NEGATIVE
Protein, ur: NEGATIVE
RBC / HPF: NONE SEEN RBC/HPF (ref <=2)
Specific Gravity, Urine: <1.005 (ref 1.001–1.035)
Yeast: NONE SEEN [HPF]
pH: 6 (ref 5.0–8.0)

## 2015-09-14 LAB — WET PREP FOR TRICH, YEAST, CLUE
Clue Cells Wet Prep HPF POC: NONE SEEN
Trich, Wet Prep: NONE SEEN
Yeast Wet Prep HPF POC: NONE SEEN

## 2015-09-14 MED ORDER — PHENAZOPYRIDINE HCL 100 MG PO TABS: 100.0000 mg | ORAL_TABLET | Freq: Three times a day (TID) | ORAL | Status: DC | PRN

## 2015-09-14 MED ORDER — FLUCONAZOLE 150 MG PO TABS: 150.0000 mg | ORAL_TABLET | Freq: Once | ORAL | Status: DC

## 2015-09-14 NOTE — Progress Notes (Addendum)
Patient ID: PERLENE SIGLEY, female   DOB: 12-24-1965, 50 y.o.   MRN: [id number] Presents with complaint of increased urinary frequency, pain at initiation of urination for about 2 weeks. Denies pain at end of stream of urination, vaginal discharge, irritation , abdominal pain or fever. Doing boot camp 3 times a week. Cycles becoming slightly more irregular mostly monthly/ BTL/same partner. 08/26/2015 treated for UTI with Cipro with positive UA and culture.  Exam: Appears well. Abdomen soft nontender, external genitalia mild erythema, speculum exam scant discharge no erythema, odor or irritation noted. Wet prep negative. Bimanual uterus enlarged approximately 12 weeks/ history of fibroids. UA: Negative, 0-5 WBCs, few bacteria.  Urinary frequency Fibroid uterus  Plan: Pyridium 100 mg 3 times daily for 5 days, prescription, proper use given. Urine culture pending. Instructed  to call if symptoms persist. Reviewed fibroid uterus may be causing some urinary frequency.

## 2015-09-15 LAB — URINE CULTURE: Colony Count: 30000

## 2015-10-14 ENCOUNTER — Encounter: Payer: Self-pay | Admitting: Family Medicine

## 2015-10-22 ENCOUNTER — Encounter: Payer: Self-pay | Admitting: Primary Care

## 2015-10-22 ENCOUNTER — Ambulatory Visit (INDEPENDENT_AMBULATORY_CARE_PROVIDER_SITE_OTHER): Payer: 59 | Admitting: Primary Care

## 2015-10-22 VITALS — BP 114/70 | HR 66 | Temp 98.5°F | Ht 66.0 in | Wt 175.8 lb

## 2015-10-22 DIAGNOSIS — R0981 Nasal congestion: Secondary | ICD-10-CM | POA: Diagnosis not present

## 2015-10-22 NOTE — Patient Instructions (Signed)
Continue Flonase nasal spray and Alka-Selzer medication.  Start a daily antihistamine such as Claritin, Zyrtec, Allegra.  If your cough becomes worse, try Delsym cough suppressant.   Please notify me if you develop persistent fevers of 101, start coughing up green mucous, notice increased fatigue or weakness, or feel worse after 1 week of onset of symptoms.   Increase consumption of water intake and rest.  It was a pleasure meeting you!  Upper Respiratory Infection, Adult Most upper respiratory infections (URIs) are a viral infection of the air passages leading to the lungs. A URI affects the nose, throat, and upper air passages. The most common type of URI is nasopharyngitis and is typically referred to as "the common cold." URIs run their course and usually go away on their own. Most of the time, a URI does not require medical attention, but sometimes a bacterial infection in the upper airways can follow a viral infection. This is called a secondary infection. Sinus and middle ear infections are common types of secondary upper respiratory infections. Bacterial pneumonia can also complicate a URI. A URI can worsen asthma and chronic obstructive pulmonary disease (COPD). Sometimes, these complications can require emergency medical care and may be life threatening.  CAUSES Almost all URIs are caused by viruses. A virus is a type of germ and can spread from one person to another.  RISKS FACTORS You may be at risk for a URI if:   You smoke.   You have chronic heart or lung disease.  You have a weakened defense (immune) system.   You are very young or very old.   You have nasal allergies or asthma.  You work in crowded or poorly ventilated areas.  You work in health care facilities or schools. SIGNS AND SYMPTOMS  Symptoms typically develop 2-3 days after you come in contact with a cold virus. Most viral URIs last 7-10 days. However, viral URIs from the influenza virus (flu virus)  can last 14-18 days and are typically more severe. Symptoms may include:   Runny or stuffy (congested) nose.   Sneezing.   Cough.   Sore throat.   Headache.   Fatigue.   Fever.   Loss of appetite.   Pain in your forehead, behind your eyes, and over your cheekbones (sinus pain).  Muscle aches.  DIAGNOSIS  Your health care provider may diagnose a URI by:  Physical exam.  Tests to check that your symptoms are not due to another condition such as:  Strep throat.  Sinusitis.  Pneumonia.  Asthma. TREATMENT  A URI goes away on its own with time. It cannot be cured with medicines, but medicines may be prescribed or recommended to relieve symptoms. Medicines may help:  Reduce your fever.  Reduce your cough.  Relieve nasal congestion. HOME CARE INSTRUCTIONS   Take medicines only as directed by your health care provider.   Gargle warm saltwater or take cough drops to comfort your throat as directed by your health care provider.  Use a warm mist humidifier or inhale steam from a shower to increase air moisture. This may make it easier to breathe.  Drink enough fluid to keep your urine clear or pale yellow.   Eat soups and other clear broths and maintain good nutrition.   Rest as needed.   Return to work when your temperature has returned to normal or as your health care provider advises. You may need to stay home longer to avoid infecting others. You can also use a  face mask and careful hand washing to prevent spread of the virus.  Increase the usage of your inhaler if you have asthma.   Do not use any tobacco products, including cigarettes, chewing tobacco, or electronic cigarettes. If you need help quitting, ask your health care provider. PREVENTION  The best way to protect yourself from getting a cold is to practice good hygiene.   Avoid oral or hand contact with people with cold symptoms.   Wash your hands often if contact occurs.  There is  no clear evidence that vitamin C, vitamin E, echinacea, or exercise reduces the chance of developing a cold. However, it is always recommended to get plenty of rest, exercise, and practice good nutrition.  SEEK MEDICAL CARE IF:   You are getting worse rather than better.   Your symptoms are not controlled by medicine.   You have chills.  You have worsening shortness of breath.  You have brown or red mucus.  You have yellow or brown nasal discharge.  You have pain in your face, especially when you bend forward.  You have a fever.  You have swollen neck glands.  You have pain while swallowing.  You have white areas in the back of your throat. SEEK IMMEDIATE MEDICAL CARE IF:   You have severe or persistent:  Headache.  Ear pain.  Sinus pain.  Chest pain.  You have chronic lung disease and any of the following:  Wheezing.  Prolonged cough.  Coughing up blood.  A change in your usual mucus.  You have a stiff neck.  You have changes in your:  Vision.  Hearing.  Thinking.  Mood. MAKE SURE YOU:   Understand these instructions.  Will watch your condition.  Will get help right away if you are not doing well or get worse.   This information is not intended to replace advice given to you by your health care provider. Make sure you discuss any questions you have with your health care provider.   Document Released: 09/13/2000 Document Revised: 08/04/2014 Document Reviewed: 06/25/2013 Elsevier Interactive Patient Education [employer].

## 2015-10-22 NOTE — Progress Notes (Signed)
Pre visit review using our clinic review tool, if applicable. No additional management support is needed unless otherwise documented below in the visit note. 

## 2015-10-22 NOTE — Progress Notes (Signed)
Subjective:    Patient ID: Peggy Hicks, female    DOB: [birth year], 50 y.o.   MRN: [id number]  HPI  Peggy Hicks is a 50 year old female with a history of allergic rhinitis who presents today with a chief complaint of nasal congestion. She also reports cough, ear discomfort, fatigue. Her symptoms have been present for 3 days. Her cough is non productive. She denies fevers, nausea.  She's taken OTC alka-selzer cold, ibuprofen, flonase with temporary improvement. She was able to return to work today.  Review of Systems  Constitutional: Positive for fatigue. Negative for fever and chills.  HENT: Positive for congestion, ear pain and sinus pressure. Negative for postnasal drip and sore throat.   Respiratory: Positive for cough.   Neurological: Positive for headaches.       Past Medical History  Diagnosis Date  . Fibroids   . Hyperhidrosis   . Stress reaction   . Hx of adenomatous colonic polyps 04/14/2014     Social History   Social History  . Marital Status: Married    Spouse Name: N/A  . Number of Children: N/A  . Years of Education: N/A   Occupational History  . Not on file.   Social History Main Topics  . Smoking status: Never Smoker   . Smokeless tobacco: Never Used  . Alcohol Use: 0.0 oz/week    0 Standard drinks or equivalent per week     Comment: Rare  . Drug Use: No  . Sexual Activity: Yes    Birth Control/ Protection: Surgical     Comment: Tubal lig-1st intercourse 50 yo--Fewer than 5 partners   Other Topics Concern  . Not on file   Social History Narrative   Exercises regularly    Past Surgical History  Procedure Laterality Date  . Breast surgery      Breast Reduction  . Myomectomy      Uterine fibroids removed/ hysteroscopic submucous myomectomy  . Tubal ligation    . Breast biopsy    . Cesarean section       X 2  . Hysteroscopy      Family History  Problem Relation Age of Onset  . Hypertension Mother   . Heart disease Mother   . Diabetes  Mother   . Cancer Maternal Aunt     liver  . Hypertension Father   . Colon cancer Neg Hx   . Esophageal cancer Neg Hx   . Rectal cancer Neg Hx   . Stomach cancer Neg Hx     No Known Allergies  Current Outpatient Prescriptions on File Prior to Visit  Medication Sig Dispense Refill  . ALPRAZolam (XANAX) 0.5 MG tablet TAKE 1 TABLET BY MOUTH 2 TIMES A DAY AS NEEDED FOR ANXIETY FOR TRAVELING 30 tablet 0  . Biotin 10 MG CAPS Take 1 capsule by mouth daily.    Marland Kitchen DICLOFENAC PO Take 75 mg by mouth as needed.    . fluticasone (FLONASE) 50 MCG/ACT nasal spray Place 2 sprays into both nostrils daily. 16 g 11  . ibuprofen (ADVIL,MOTRIN) 600 MG tablet Take 1 tablet (600 mg total) by mouth every 8 (eight) hours as needed. 60 tablet 1  . Multiple Vitamins-Minerals (MULTIVITAMIN PO) Take 1 tablet by mouth daily. Women's multivitamin daily    . NONFORMULARY OR COMPOUNDED ITEM Boric acid suppositories 600 mg #30 one per vagina 3 times weekly as needed for yeast refill x3 30 each 1   No current facility-administered medications on  file prior to visit.    BP 114/70 mmHg  Pulse 66  Temp(Src) 98.5 F (36.9 C) (Oral)  Ht 5\' 6"  (1.676 m)  Wt 175 lb 12.8 oz (79.742 kg)  BMI 28.39 kg/m2  SpO2 95%  LMP 10/15/2015    Objective:   Physical Exam  Constitutional: She appears well-nourished.  HENT:  Right Ear: Ear canal normal. Tympanic membrane is not erythematous.  Left Ear: Tympanic membrane and ear canal normal. Tympanic membrane is not erythematous.  Nose: Right sinus exhibits no maxillary sinus tenderness and no frontal sinus tenderness. Left sinus exhibits no maxillary sinus tenderness and no frontal sinus tenderness.  Mouth/Throat: Oropharynx is clear and moist.  Mild effusion to right TM.  Eyes: Conjunctivae are normal.  Neck: Neck supple.  Cardiovascular: Normal rate and regular rhythm.   Pulmonary/Chest: Effort normal and breath sounds normal. She has no wheezes. She has no rales.    Lymphadenopathy:    She has no cervical adenopathy.  Skin: Skin is warm and dry.          Assessment & Plan:  Nasal congestion:  Also with frontal sinus pressure, cough, ear pain, fatigue. Temporary improvement with OTC treatment. Exam today unremarkable, lungs clear, does not appear acutely ill. Could be either viral or allergy related and will treat with conservative measures. Continue Flonase, Alka-Seltzer. Add daily antihistamine. Increase consumption of water and rest. Return precautions provided.  Sheral Flow, NP

## 2016-01-01 ENCOUNTER — Other Ambulatory Visit: Payer: Self-pay | Admitting: Family Medicine

## 2016-01-03 NOTE — Telephone Encounter (Signed)
No recent/future appts, and last filled on 07/05/15 #30 with 0 refills, please advise

## 2016-01-03 NOTE — Telephone Encounter (Signed)
Px written for call in   

## 2016-01-03 NOTE — Telephone Encounter (Signed)
Rx called in as prescribed 

## 2016-01-04 ENCOUNTER — Encounter: Payer: Self-pay | Admitting: Women's Health

## 2016-01-04 ENCOUNTER — Ambulatory Visit (INDEPENDENT_AMBULATORY_CARE_PROVIDER_SITE_OTHER): Payer: 59 | Admitting: Women's Health

## 2016-01-04 VITALS — BP 124/80 | Ht 66.0 in | Wt 175.0 lb

## 2016-01-04 DIAGNOSIS — R350 Frequency of micturition: Secondary | ICD-10-CM

## 2016-01-04 DIAGNOSIS — N898 Other specified noninflammatory disorders of vagina: Secondary | ICD-10-CM | POA: Diagnosis not present

## 2016-01-04 LAB — URINALYSIS W MICROSCOPIC + REFLEX CULTURE
Bacteria, UA: NONE SEEN [HPF]
Bilirubin Urine: NEGATIVE
Casts: NONE SEEN [LPF]
Crystals: NONE SEEN [HPF]
Glucose, UA: NEGATIVE
Hgb urine dipstick: NEGATIVE
Ketones, ur: NEGATIVE
Leukocytes, UA: NEGATIVE
Nitrite: NEGATIVE
Protein, ur: NEGATIVE
RBC / HPF: NONE SEEN RBC/HPF (ref <=2)
Specific Gravity, Urine: <1.005 (ref 1.001–1.035)
WBC, UA: NONE SEEN WBC/HPF (ref <=5)
Yeast: NONE SEEN [HPF]
pH: 6 (ref 5.0–8.0)

## 2016-01-04 LAB — WET PREP FOR TRICH, YEAST, CLUE
Clue Cells Wet Prep HPF POC: NONE SEEN
Trich, Wet Prep: NONE SEEN
WBC, Wet Prep HPF POC: NONE SEEN
Yeast Wet Prep HPF POC: NONE SEEN

## 2016-01-04 NOTE — Progress Notes (Signed)
Presents with complaint of increased yellow discharge with questionable odor, mild irritation for several days.  Same partner, continues with a monthly cycle/BTL with increasing menopausal symptoms of hot flushes, poor sleep and vaginal dryness. Denies urinary symptoms other than occasional frequency. Denies abdominal pain or fever.  Exam: Appears well. External genitalia within normal limits, speculum exam vaginal walls without erythema, minimal discharge, no odor noted, wet prep negative. Bimanual uterus bulky/fibroid uterus. No CMT or tenderness with exam. UA: Negative.  Normal exam Perimenopausal  Plan: Menopause reviewed, reviewed normality of wet prep, exam and UA. Reassurance given. Vaginal lubricants encouraged, short discussion on HRT.

## 2016-03-09 ENCOUNTER — Telehealth: Payer: Self-pay | Admitting: Family Medicine

## 2016-03-09 DIAGNOSIS — Z0000 Encounter for general adult medical examination without abnormal findings: Secondary | ICD-10-CM

## 2016-03-09 DIAGNOSIS — R739 Hyperglycemia, unspecified: Secondary | ICD-10-CM

## 2016-03-09 NOTE — Telephone Encounter (Signed)
Patient works for [employer] and patient has an appointment for a cpx on 04/25/16.  Patient would like to have an order put in Epic, so she can have her lab work done at [employer] before her appointment.

## 2016-03-10 NOTE — Telephone Encounter (Signed)
Spoke to pt who states that you can place the order for the week of Jan 15. She is unaware at the moment exactly what day she will be having them drawn

## 2016-03-10 NOTE — Telephone Encounter (Signed)
Attempted to contact pt. Unable to leave message.

## 2016-03-10 NOTE — Telephone Encounter (Signed)
What day does she want her labs drawn?   Let me know so I can order  thanks

## 2016-03-13 NOTE — Telephone Encounter (Signed)
[facility] lab order done Print out in IN box to get to pt please

## 2016-03-13 NOTE — Addendum Note (Signed)
Addended by: Loura Pardon A on: 03/13/2016 05:36 PM   Modules accepted: Orders

## 2016-03-14 ENCOUNTER — Telehealth: Payer: Self-pay | Admitting: Family Medicine

## 2016-03-14 NOTE — Telephone Encounter (Signed)
Pt returned call , please call back   °

## 2016-03-14 NOTE — Telephone Encounter (Signed)
Pt notified lab orders are done and copy of orders mailed to pt

## 2016-03-14 NOTE — Telephone Encounter (Signed)
error 

## 2016-03-14 NOTE — Telephone Encounter (Signed)
Called pt to let her know that lab orders are done but no answer and no voicemail set up,   I have paper lab orders and need to know if she wants to pick them up or have them mailed to her, will hold until I reach pt

## 2016-03-22 ENCOUNTER — Telehealth: Payer: Self-pay

## 2016-03-22 NOTE — Telephone Encounter (Signed)
Patient called asking for Rx for "a slight UTI".  I told her recommendation is office visit for diagnosis and treatment. Appt scheduled for tomorrow with [place].

## 2016-03-23 ENCOUNTER — Ambulatory Visit (INDEPENDENT_AMBULATORY_CARE_PROVIDER_SITE_OTHER): Payer: 59 | Admitting: Women's Health

## 2016-03-23 ENCOUNTER — Encounter: Payer: Self-pay | Admitting: Women's Health

## 2016-03-23 VITALS — BP 128/80 | Ht 66.0 in | Wt 175.0 lb

## 2016-03-23 DIAGNOSIS — R300 Dysuria: Secondary | ICD-10-CM | POA: Diagnosis not present

## 2016-03-23 DIAGNOSIS — N898 Other specified noninflammatory disorders of vagina: Secondary | ICD-10-CM | POA: Diagnosis not present

## 2016-03-23 LAB — URINALYSIS W MICROSCOPIC + REFLEX CULTURE
Bilirubin Urine: NEGATIVE
Casts: NONE SEEN [LPF]
Crystals: NONE SEEN [HPF]
Glucose, UA: NEGATIVE
Hgb urine dipstick: NEGATIVE
Ketones, ur: NEGATIVE
Leukocytes, UA: NEGATIVE
Nitrite: NEGATIVE
Protein, ur: NEGATIVE
RBC / HPF: NONE SEEN RBC/HPF
Specific Gravity, Urine: 1.01 (ref 1.001–1.035)
Yeast: NONE SEEN [HPF]
pH: 6.5 (ref 5.0–8.0)

## 2016-03-23 LAB — WET PREP FOR TRICH, YEAST, CLUE
Clue Cells Wet Prep HPF POC: NONE SEEN
Trich, Wet Prep: NONE SEEN
Yeast Wet Prep HPF POC: NONE SEEN

## 2016-03-23 NOTE — Patient Instructions (Signed)
Atrophic Vaginitis Introduction Atrophic vaginitis is when the tissues that line the vagina become dry and thin. This is caused by a drop in estrogen. Estrogen helps:  To keep the vagina moist.  To make a clear fluid that helps:  To lubricate the vagina for sex.  To protect the vagina from infection. If the lining of the vagina is dry and thin, it may:  Make sex painful. It may also cause bleeding.  Cause a feeling of:  Burning.  Irritation.  Itchiness.  Make an exam of your vagina painful. It may also cause bleeding.  Make you lose interest in sex.  Cause a burning feeling when you pee.  Make your vaginal fluid (discharge) brown or yellow. For some women, there are no symptoms. This condition is most common in women who do not get their regular menstrual periods anymore (menopause). This often starts when a woman is 77-75 years old. Follow these instructions at home:  Take medicines only as told by your doctor. Do not use any herbal or alternative medicines unless your doctor says it is okay.  Use over-the-counter products for dryness only as told by your doctor. These include:  Creams.  Lubricants.  Moisturizers.  Do not douche.  Do not use products that can make your vagina dry. These include:  Scented feminine sprays.  Scented tampons.  Scented soaps.  If it hurts to have sex, tell your sexual partner. Contact a doctor if:  Your discharge looks different than normal.  Your vagina has an unusual smell.  You have new symptoms.  Your symptoms do not get better with treatment.  Your symptoms get worse. This information is not intended to replace advice given to you by your health care provider. Make sure you discuss any questions you have with your health care provider. Document Released: 09/06/2007 Document Revised: 08/26/2015 Document Reviewed: 03/11/2014  2017 Elsevier

## 2016-03-23 NOTE — Addendum Note (Signed)
Addended by: Burnett Kanaris on: 03/23/2016 03:44 PM   Modules accepted: Orders

## 2016-03-23 NOTE — Progress Notes (Signed)
Presents with complaint of burning, urgency with urination and occasional itching  that started 1 week ago. Denies burning at the end of urine stream, discharge, odor, fever.  Occasional abdominal pain. Monthly cycle/BTL/same partner. Having  menopausal symptoms of hot flushes.  Exam: Appears well. Abdomen soft, obese nontender, external genitalia mild atrophy, speculum exam no visible erythema or discharge, atrophic. Wet prep negative. Bimanual no CMT or tenderness. UA: Negative, 0-5 WBCs, 6-10 squamous epithelials, few bacteria  Normal exam Mild vaginal atrophy  Plan: Reviewed normality of wet prep, UA, exam, reviewed vaginal atrophy may be causing some the discomfort encouraged vaginal lubricant with intercourse. Menopause reviewed. Urine culture pending.

## 2016-03-24 LAB — URINE CULTURE

## 2016-04-19 LAB — CBC WITH DIFFERENTIAL/PLATELET
Basophils Absolute: 0 10*3/uL (ref 0.0–0.2)
Basos: 0 %
EOS (ABSOLUTE): 0 10*3/uL (ref 0.0–0.4)
Eos: 1 %
Hematocrit: 38.8 % (ref 34.0–46.6)
Hemoglobin: 13 g/dL (ref 11.1–15.9)
Immature Grans (Abs): 0 10*3/uL (ref 0.0–0.1)
Immature Granulocytes: 0 %
Lymphocytes Absolute: 2.4 10*3/uL (ref 0.7–3.1)
Lymphs: 37 %
MCH: 28.7 pg (ref 26.6–33.0)
MCHC: 33.5 g/dL (ref 31.5–35.7)
MCV: 86 fL (ref 79–97)
Monocytes Absolute: 0.4 10*3/uL (ref 0.1–0.9)
Monocytes: 6 %
Neutrophils Absolute: 3.7 10*3/uL (ref 1.4–7.0)
Neutrophils: 56 %
Platelets: 311 10*3/uL (ref 150–379)
RBC: 4.53 x10E6/uL (ref 3.77–5.28)
RDW: 14.8 % (ref 12.3–15.4)
WBC: 6.6 10*3/uL (ref 3.4–10.8)

## 2016-04-19 LAB — LIPID PANEL
Chol/HDL Ratio: 2.6 ratio units (ref 0.0–4.4)
Cholesterol, Total: 233 mg/dL — ABNORMAL HIGH (ref 100–199)
HDL: 89 mg/dL (ref >39)
LDL Calculated: 129 mg/dL — ABNORMAL HIGH (ref 0–99)
Triglycerides: 75 mg/dL (ref 0–149)
VLDL Cholesterol Cal: 15 mg/dL (ref 5–40)

## 2016-04-19 LAB — COMPREHENSIVE METABOLIC PANEL
ALT: 9 IU/L (ref 0–32)
AST: 14 IU/L (ref 0–40)
Albumin/Globulin Ratio: 1.6 (ref 1.2–2.2)
Albumin: 4.1 g/dL (ref 3.5–5.5)
Alkaline Phosphatase: 59 IU/L (ref 39–117)
BUN/Creatinine Ratio: 13 (ref 9–23)
BUN: 11 mg/dL (ref 6–24)
Bilirubin Total: 0.5 mg/dL (ref 0.0–1.2)
CO2: 23 mmol/L (ref 18–29)
Calcium: 9.3 mg/dL (ref 8.7–10.2)
Chloride: 103 mmol/L (ref 96–106)
Creatinine, Ser: 0.85 mg/dL (ref 0.57–1.00)
GFR calc Af Amer: 91 mL/min/{1.73_m2} (ref >59)
GFR calc non Af Amer: 79 mL/min/{1.73_m2} (ref >59)
Globulin, Total: 2.6 g/dL (ref 1.5–4.5)
Glucose: 95 mg/dL (ref 65–99)
Potassium: 4.6 mmol/L (ref 3.5–5.2)
Sodium: 140 mmol/L (ref 134–144)
Total Protein: 6.7 g/dL (ref 6.0–8.5)

## 2016-04-19 LAB — HEMOGLOBIN A1C
Est. average glucose Bld gHb Est-mCnc: 120 mg/dL
Hgb A1c MFr Bld: 5.8 % — ABNORMAL HIGH (ref 4.8–5.6)

## 2016-04-19 LAB — TSH: TSH: 1.99 u[IU]/mL (ref 0.450–4.500)

## 2016-04-25 ENCOUNTER — Ambulatory Visit (INDEPENDENT_AMBULATORY_CARE_PROVIDER_SITE_OTHER): Payer: 59 | Admitting: Family Medicine

## 2016-04-25 ENCOUNTER — Encounter: Payer: Self-pay | Admitting: Family Medicine

## 2016-04-25 ENCOUNTER — Encounter: Payer: 59 | Admitting: Family Medicine

## 2016-04-25 VITALS — BP 124/86 | HR 56 | Temp 98.5°F | Ht 66.0 in | Wt 177.2 lb

## 2016-04-25 DIAGNOSIS — Z23 Encounter for immunization: Secondary | ICD-10-CM

## 2016-04-25 DIAGNOSIS — R739 Hyperglycemia, unspecified: Secondary | ICD-10-CM | POA: Diagnosis not present

## 2016-04-25 DIAGNOSIS — Z0000 Encounter for general adult medical examination without abnormal findings: Secondary | ICD-10-CM | POA: Diagnosis not present

## 2016-04-25 MED ORDER — FLUTICASONE PROPIONATE 50 MCG/ACT NA SUSP: 2.0000 | Freq: Every day | NASAL | 11 refills | Status: DC

## 2016-04-25 MED ORDER — TETANUS-DIPHTH-ACELL PERTUSSIS 5-2.5-18.5 LF-MCG/0.5 IM SUSP
0.5000 mL | Freq: Once | INTRAMUSCULAR | Status: AC
  Administered 2016-04-25: 0.5 mL via INTRAMUSCULAR

## 2016-04-25 NOTE — Progress Notes (Signed)
Pre visit review using our clinic review tool, if applicable. No additional management support is needed unless otherwise documented below in the visit note. 

## 2016-04-25 NOTE — Patient Instructions (Signed)
Labs look great  Take care of yourself  Keep exercising Try to get 1200-1500 mg of calcium per day with at least 1000 iu of vitamin D - for bone health  Flu shot today  Tetanus shot today (Tdap)

## 2016-04-25 NOTE — Progress Notes (Signed)
Subjective:    Patient ID: Peggy Hicks, female    DOB: Oct 09, 1965, 51 y.o.   MRN: [id number]  HPI  Here for health maintenance exam and to review chronic medical problems    Doing well  Working a lot  Doing boot camp for exercise - likes it and she is getting stronger  Back pain is improved   Wt Readings from Last 3 Encounters:  04/25/16 177 lb 4 oz (80.4 kg)  03/23/16 175 lb (79.4 kg)  01/04/16 175 lb (79.4 kg)  stable weight  Trying to eat healthier  Working on portion control  bmi 28.6  Tetanus shot 5/07 Will get that today   Flu shot-given today   Goes to gyn every year  Last pap 2015-normal  Hx of fibroids and surgery Not sure when last pap  Has f/u next month   Mammogram 7/17-neg Self breast exam - no breast lumps   Colonoscopy 1/16- adenomas Recall is 2021  Hep C and HIV screening utd   Hx of hyperglycemia Lab Results  Component Value Date   HGBA1C 5.8 (H) 04/18/2016  down from 6.1  Doing well / eating better/ exercising     Chemistry      Component Value Date/Time   NA 140 04/18/2016 0905   K 4.6 04/18/2016 0905   CL 103 04/18/2016 0905   CO2 23 04/18/2016 0905   BUN 11 04/18/2016 0905   CREATININE 0.85 04/18/2016 0905   CREATININE 0.81 05/21/2012 1432      Component Value Date/Time   CALCIUM 9.3 04/18/2016 0905   ALKPHOS 59 04/18/2016 0905   AST 14 04/18/2016 0905   ALT 9 04/18/2016 0905   BILITOT 0.5 04/18/2016 0905      Lab Results  Component Value Date   WBC 6.6 04/18/2016   HGB 13.3 05/17/2015   HCT 38.8 04/18/2016   MCV 86 04/18/2016   PLT 311 04/18/2016   Lab Results  Component Value Date   TSH 1.990 04/18/2016      Lab Results  Component Value Date   CHOL 233 (H) 04/18/2016   HDL 89 04/18/2016   LDLCALC 129 (H) 04/18/2016   TRIG 75 04/18/2016   CHOLHDL 2.6 04/18/2016   Very good HDL  Does not eat grease/fried foods/or red meat   Patient Active Problem List   Diagnosis Date Noted  . Allergic rhinitis  02/02/2015  . Hx of adenomatous colonic polyps 04/14/2014  . Left lumbar radiculopathy 12/10/2012  . Routine general medical examination at a health care facility 01/01/2012  . Stress reaction 11/13/2011  . Hyperglycemia 11/15/2010  . PRIMARY FOCAL HYPERHIDROSIS 10/22/2007  . TEMPOROMANDIBULAR JOINT DISORDER 01/25/2007  . Situational anxiety 08/01/2006   Past Medical History:  Diagnosis Date  . Fibroids   . Hx of adenomatous colonic polyps 04/14/2014  . Hyperhidrosis   . Stress reaction    Past Surgical History:  Procedure Laterality Date  . BREAST BIOPSY    . BREAST SURGERY     Breast Reduction  . CESAREAN SECTION      X 2  . HYSTEROSCOPY    . MYOMECTOMY     Uterine fibroids removed/ hysteroscopic submucous myomectomy  . TUBAL LIGATION     Social History  Substance Use Topics  . Smoking status: Never Smoker  . Smokeless tobacco: Never Used  . Alcohol use 0.0 oz/week     Comment: Rare   Family History  Problem Relation Age of Onset  . Hypertension Mother   .  Heart disease Mother   . Diabetes Mother   . Hypertension Father   . Cancer Maternal Aunt     liver  . Colon cancer Neg Hx   . Esophageal cancer Neg Hx   . Rectal cancer Neg Hx   . Stomach cancer Neg Hx    No Known Allergies Current Outpatient Prescriptions on File Prior to Visit  Medication Sig Dispense Refill  . ALPRAZolam (XANAX) 0.5 MG tablet TAKE 1 TABLET BY MOUTH 2 TIMES A DAY AS NEEDED FOR ANXIETY FOR TRAVELING 30 tablet 0  . Biotin 10 MG CAPS Take 1 capsule by mouth daily.    Marland Kitchen DICLOFENAC PO Take 75 mg by mouth as needed.    Marland Kitchen ibuprofen (ADVIL,MOTRIN) 600 MG tablet Take 1 tablet (600 mg total) by mouth every 8 (eight) hours as needed. 60 tablet 1  . Multiple Vitamins-Minerals (MULTIVITAMIN PO) Take 1 tablet by mouth daily. Women's multivitamin daily    . NONFORMULARY OR COMPOUNDED ITEM Boric acid suppositories 600 mg #30 one per vagina 3 times weekly as needed for yeast refill x3 30 each 1   No  current facility-administered medications on file prior to visit.     Review of Systems     Objective:   Physical Exam  Constitutional: She appears well-developed and well-nourished. No distress.  Well appearing   HENT:  Head: Normocephalic and atraumatic.  Right Ear: External ear normal.  Left Ear: External ear normal.  Nose: Nose normal.  Mouth/Throat: Oropharynx is clear and moist.  Eyes: Conjunctivae and EOM are normal. Pupils are equal, round, and reactive to light. Right eye exhibits no discharge. Left eye exhibits no discharge. No scleral icterus.  Neck: Normal range of motion. Neck supple. No JVD present. Carotid bruit is not present. No thyromegaly present.  Cardiovascular: Normal rate, regular rhythm, normal heart sounds and intact distal pulses.  Exam reveals no gallop.   Pulmonary/Chest: Effort normal and breath sounds normal. No respiratory distress. She has no wheezes. She has no rales.  Abdominal: Soft. Bowel sounds are normal. She exhibits no distension and no mass. There is no tenderness.  Genitourinary:  Genitourinary Comments: Gyn exam will be done next mo with her gyn provider  Musculoskeletal: She exhibits no edema or tenderness.  Lymphadenopathy:    She has no cervical adenopathy.  Neurological: She is alert. She has normal reflexes. No cranial nerve deficit. She exhibits normal muscle tone. Coordination normal.  Skin: Skin is warm and dry. No rash noted. No erythema. No pallor.  Some hypopigmentation on face-baseline  Few skin tags   Psychiatric: She has a normal mood and affect.          Assessment & Plan:   Problem List Items Addressed This Visit      Other   Hyperglycemia - Primary    Lab Results  Component Value Date   HGBA1C 5.8 (H) 04/18/2016   This is well controlled with diet/exercise  Enc her to keep it up to prevent DM      Routine general medical examination at a health care facility    Reviewed health habits including diet and  exercise and skin cancer prevention Reviewed appropriate screening tests for age  Also reviewed health mt list, fam hx and immunization status , as well as social and family history   See HPI Labs rev  Flu shot and Tdap today  Enc her to keep exercising         Other Visit Diagnoses  Need for influenza vaccination       Relevant Orders   Flu Vaccine QUAD 36+ mos IM (Completed)   Need for Tdap vaccination       Relevant Medications   Tdap (BOOSTRIX) injection 0.5 mL (Completed)

## 2016-04-26 NOTE — Assessment & Plan Note (Signed)
Lab Results  Component Value Date   HGBA1C 5.8 (H) 04/18/2016   This is well controlled with diet/exercise  Enc her to keep it up to prevent DM

## 2016-04-26 NOTE — Assessment & Plan Note (Signed)
Reviewed health habits including diet and exercise and skin cancer prevention Reviewed appropriate screening tests for age  Also reviewed health mt list, fam hx and immunization status , as well as social and family history   See HPI Labs rev  Flu shot and Tdap today  Enc her to keep exercising

## 2016-05-29 ENCOUNTER — Ambulatory Visit (INDEPENDENT_AMBULATORY_CARE_PROVIDER_SITE_OTHER): Payer: 59 | Admitting: Gynecology

## 2016-05-29 ENCOUNTER — Encounter: Payer: Self-pay | Admitting: Gynecology

## 2016-05-29 VITALS — BP 120/76 | Ht 66.0 in | Wt 174.0 lb

## 2016-05-29 DIAGNOSIS — Z01411 Encounter for gynecological examination (general) (routine) with abnormal findings: Secondary | ICD-10-CM | POA: Diagnosis not present

## 2016-05-29 DIAGNOSIS — D259 Leiomyoma of uterus, unspecified: Secondary | ICD-10-CM

## 2016-05-29 DIAGNOSIS — N951 Menopausal and female climacteric states: Secondary | ICD-10-CM

## 2016-05-29 NOTE — Patient Instructions (Signed)

## 2016-05-29 NOTE — Progress Notes (Signed)
    DYLILAH STASER May 02, 1965 [id number]        51 y.o.  G2P2002 for annual exam.   Past medical history,surgical history, problem list, medications, allergies, family history and social history were all reviewed and documented as reviewed in the EPIC chart.  ROS:  Performed with pertinent positives and negatives included in the history, assessment and plan.   Additional significant findings :  None   Exam: [name] assistant Vitals:   05/29/16 1539  BP: 120/76  Weight: 174 lb (78.9 kg)  Height: 5\' 6"  (1.676 m)   Body mass index is 28.08 kg/m.  General appearance:  Normal affect, orientation and appearance. Skin: Grossly normal HEENT: Without gross lesions.  No cervical or supraclavicular adenopathy. Thyroid normal.  Lungs:  Clear without wheezing, rales or rhonchi Cardiac: RR, without RMG Abdominal:  Soft, nontender, without masses, guarding, rebound, organomegaly or hernia Breasts:  Examined lying and sitting without masses, retractions, discharge or axillary adenopathy. Bilateral reduction scars noted Pelvic:  Ext, BUS, Vagina: Normal  Cervix: Normal  Uterus: 14 weeks size, irregular, midline mobile consistent with history of leiomyoma  Adnexa: Without gross masses or tenderness    Anus and perineum: Normal   Rectovaginal: Normal sphincter tone without palpated masses or tenderness.    Assessment/Plan:  51 y.o. G22P2002 female for annual exam, regular menses, tubal sterilization .   1. Menopausal symptoms. Patient starting to have hot flushes and sweats. Still having monthly menses. Sometimes spaces out to 30-40 days but regular flow and no intermenstrual bleeding. Discussed the perimenopause with her and options. She is not interested in hormonal manipulation or HRT. Prefers to monitor now. Will call if it becomes more of an issue. Will follow up if has prolonged or atypical bleeding. 2. 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 Asymptomatic to the patient. Will monitor at  present at her choice. 3. Mammography 10/2015. Continue with annual mammography when due. SBE monthly reviewed. 4. Pap smear/HPV 05/2013. No Pap smear done today. No history of significant abnormal Pap smears. Plan repeat Pap smear approaching 5 year interval per current screening guidelines. 5. Colonoscopy 2016. Repeat at their recommended interval. 6. Health maintenance. No routine lab work done as patient reports this done elsewhere. Follow up 1 year, sooner as needed.   Anastasio Auerbach MD, 4:31 PM 05/29/2016

## 2016-08-16 ENCOUNTER — Encounter: Payer: Self-pay | Admitting: Gynecology

## 2016-09-04 ENCOUNTER — Encounter: Payer: Self-pay | Admitting: Family Medicine

## 2016-09-04 ENCOUNTER — Other Ambulatory Visit: Payer: Self-pay | Admitting: Family Medicine

## 2016-09-04 DIAGNOSIS — R1031 Right lower quadrant pain: Secondary | ICD-10-CM

## 2016-09-04 MED ORDER — IBUPROFEN 600 MG PO TABS: 600.0000 mg | ORAL_TABLET | Freq: Three times a day (TID) | ORAL | 3 refills | Status: DC | PRN

## 2016-09-04 MED ORDER — ALPRAZOLAM 0.5 MG PO TABS
0.5000 mg | ORAL_TABLET | Freq: Every day | ORAL | 0 refills | Status: DC | PRN
Start: 2016-09-04 — End: 2017-01-05

## 2016-09-04 NOTE — Telephone Encounter (Signed)
See mychart request, last filled on 01/01/15 #60 tabs with 1 refill, please advise

## 2016-09-04 NOTE — Telephone Encounter (Signed)
Rx called in as prescribed 

## 2016-09-04 NOTE — Telephone Encounter (Signed)
Last filled on 01/31/16 #30 tabs with 0 refill with the directions : TAKE 1 TABLET BY MOUTH 2 TIMES A DAY AS NEEDED FOR ANXIETY FOR TRAVELING  Please advise

## 2016-09-04 NOTE — Telephone Encounter (Signed)
Px written for call in   

## 2016-10-12 ENCOUNTER — Ambulatory Visit (INDEPENDENT_AMBULATORY_CARE_PROVIDER_SITE_OTHER): Payer: 59 | Admitting: Family Medicine

## 2016-10-12 ENCOUNTER — Encounter: Payer: Self-pay | Admitting: Family Medicine

## 2016-10-12 VITALS — BP 128/84 | HR 82 | Temp 98.4°F | Ht 66.0 in | Wt 179.0 lb

## 2016-10-12 DIAGNOSIS — J029 Acute pharyngitis, unspecified: Secondary | ICD-10-CM

## 2016-10-12 LAB — POCT RAPID STREP A (OFFICE): Rapid Strep A Screen: NEGATIVE

## 2016-10-12 NOTE — Progress Notes (Signed)
BP 128/84   Pulse 82   Temp 98.4 F (36.9 C)   Ht 5\' 6"  (1.676 m)   Wt 179 lb (81.2 kg)   BMI 28.89 kg/m    CC: "my throat is killing me" Subjective:    Patient ID: Peggy Hicks, female    DOB: 1965-07-19, 51 y.o.   MRN: [id number]  HPI: Peggy Hicks is a 51 y.o. female presenting on 10/12/2016 for Acute Visit ( Sore Throat)   2d h/o bad ST associated with slight fever. Head feels stuffy, L earache. Some PNDrainage.   Treating with motrin 400mg  bid. Also gargling with salt water.  She does regularly take flonase  No chills, cough, wheezing.  No sick contacts at home. Non smokers at home.   Relevant past medical, surgical, family and social history reviewed and updated as indicated. Interim medical history since our last visit reviewed. Allergies and medications reviewed and updated. Outpatient Medications Prior to Visit  Medication Sig Dispense Refill  . ALPRAZolam (XANAX) 0.5 MG tablet Take 1 tablet (0.5 mg total) by mouth daily as needed for anxiety. For traveling 30 tablet 0  . Biotin 10 MG CAPS Take 1 capsule by mouth daily.    Marland Kitchen DICLOFENAC PO Take 75 mg by mouth as needed.    . fluticasone (FLONASE) 50 MCG/ACT nasal spray Place 2 sprays into both nostrils daily. 16 g 11  . ibuprofen (ADVIL,MOTRIN) 600 MG tablet Take 1 tablet (600 mg total) by mouth every 8 (eight) hours as needed. 60 tablet 3  . Multiple Vitamins-Minerals (MULTIVITAMIN PO) Take 1 tablet by mouth daily. Women's multivitamin daily    . NONFORMULARY OR COMPOUNDED ITEM Boric acid suppositories 600 mg #30 one per vagina 3 times weekly as needed for yeast refill x3 (Patient not taking: Reported on 05/29/2016) 30 each 1   No facility-administered medications prior to visit.      Per HPI unless specifically indicated in ROS section below Review of Systems     Objective:    BP 128/84   Pulse 82   Temp 98.4 F (36.9 C)   Ht 5\' 6"  (1.676 m)   Wt 179 lb (81.2 kg)   BMI 28.89 kg/m   Wt Readings from  Last 3 Encounters:  10/12/16 179 lb (81.2 kg)  05/29/16 174 lb (78.9 kg)  04/25/16 177 lb 4 oz (80.4 kg)    Physical Exam  Constitutional: She appears well-developed and well-nourished. No distress.  HENT:  Head: Normocephalic and atraumatic.  Right Ear: Hearing, tympanic membrane, external ear and ear canal normal.  Left Ear: Hearing, tympanic membrane, external ear and ear canal normal.  Nose: No mucosal edema or rhinorrhea.  Mouth/Throat: Uvula is midline and mucous membranes are normal. Posterior oropharyngeal erythema present. No oropharyngeal exudate, posterior oropharyngeal edema or tonsillar abscesses.  White post nasal drainage present  Eyes: Pupils are equal, round, and reactive to light. Conjunctivae and EOM are normal. No scleral icterus.  Neck: Normal range of motion. Neck supple. No thyromegaly present.  No LAD appreciated  Cardiovascular: Normal rate, regular rhythm, normal heart sounds and intact distal pulses.   No murmur heard. Pulmonary/Chest: Effort normal and breath sounds normal. No respiratory distress. She has no wheezes. She has no rales.  Lymphadenopathy:    She has no cervical adenopathy.  Skin: Skin is warm and dry. No rash noted.  Nursing note and vitals reviewed.  Results for orders placed or performed in visit on 10/12/16  POCT rapid strep  A  Result Value Ref Range   Rapid Strep A Screen Negative Negative      Assessment & Plan:   Problem List Items Addressed This Visit    Sore throat - Primary    RST today negative.  Anticipate viral pharygitis  Supportive care reviewed as per instructions. Update if not improving with treatment.       Relevant Orders   POCT rapid strep A (Completed)       Follow up plan: Return if symptoms worsen or fail to improve.  Ria Bush, MD

## 2016-10-12 NOTE — Assessment & Plan Note (Addendum)
RST today negative.  Anticipate viral pharygitis  Supportive care reviewed as per instructions. Update if not improving with treatment.

## 2016-10-12 NOTE — Patient Instructions (Signed)
You have viral pharyngitis. Push fluids and plenty of rest. May use ibuprofen for throat inflammation (400-600mg  three times daily with meals for 3-5 days). Continue salt water gargles. Suck on cold things like popsicles or warm things like herbal teas (whichever soothes the throat better). Return if fever >101.5, worsening pain, or trouble opening/closing mouth, or hoarse voice. Good to see you today, call clinic with questions.

## 2016-10-16 ENCOUNTER — Telehealth: Payer: Self-pay

## 2016-10-16 NOTE — Telephone Encounter (Signed)
Any fever? Still sounds viral. rec continue ibuprofen or diclofenac Could add plain mucinex with large glass of water to help mobilize mucous. May take nyquil at night to help rest.  Let us know if fever >101, or productive cough not improving by later this week.

## 2016-10-16 NOTE — Telephone Encounter (Signed)
Pt left v/m; pt seen 10/12/16 and now pt has prod cough with green phlegm and a lot of mucus when blows nose. Pt request cb with what to do. [facility].

## 2016-10-16 NOTE — Telephone Encounter (Signed)
Patient advised.

## 2016-10-23 ENCOUNTER — Encounter: Payer: Self-pay | Admitting: Family Medicine

## 2016-11-02 IMAGING — CR DG CHEST 2V
1 series · 2 of 2 positions shown · non-contrast
Comparison: None.

CLINICAL DATA: Chest pain, epigastric pain. Sleeping when this
occurred.

EXAM:
CHEST  2 VIEW

[Series 1: dg chest 2 view · 0.14mm/px · 2 of 2 slices shown]
[im 1/2]
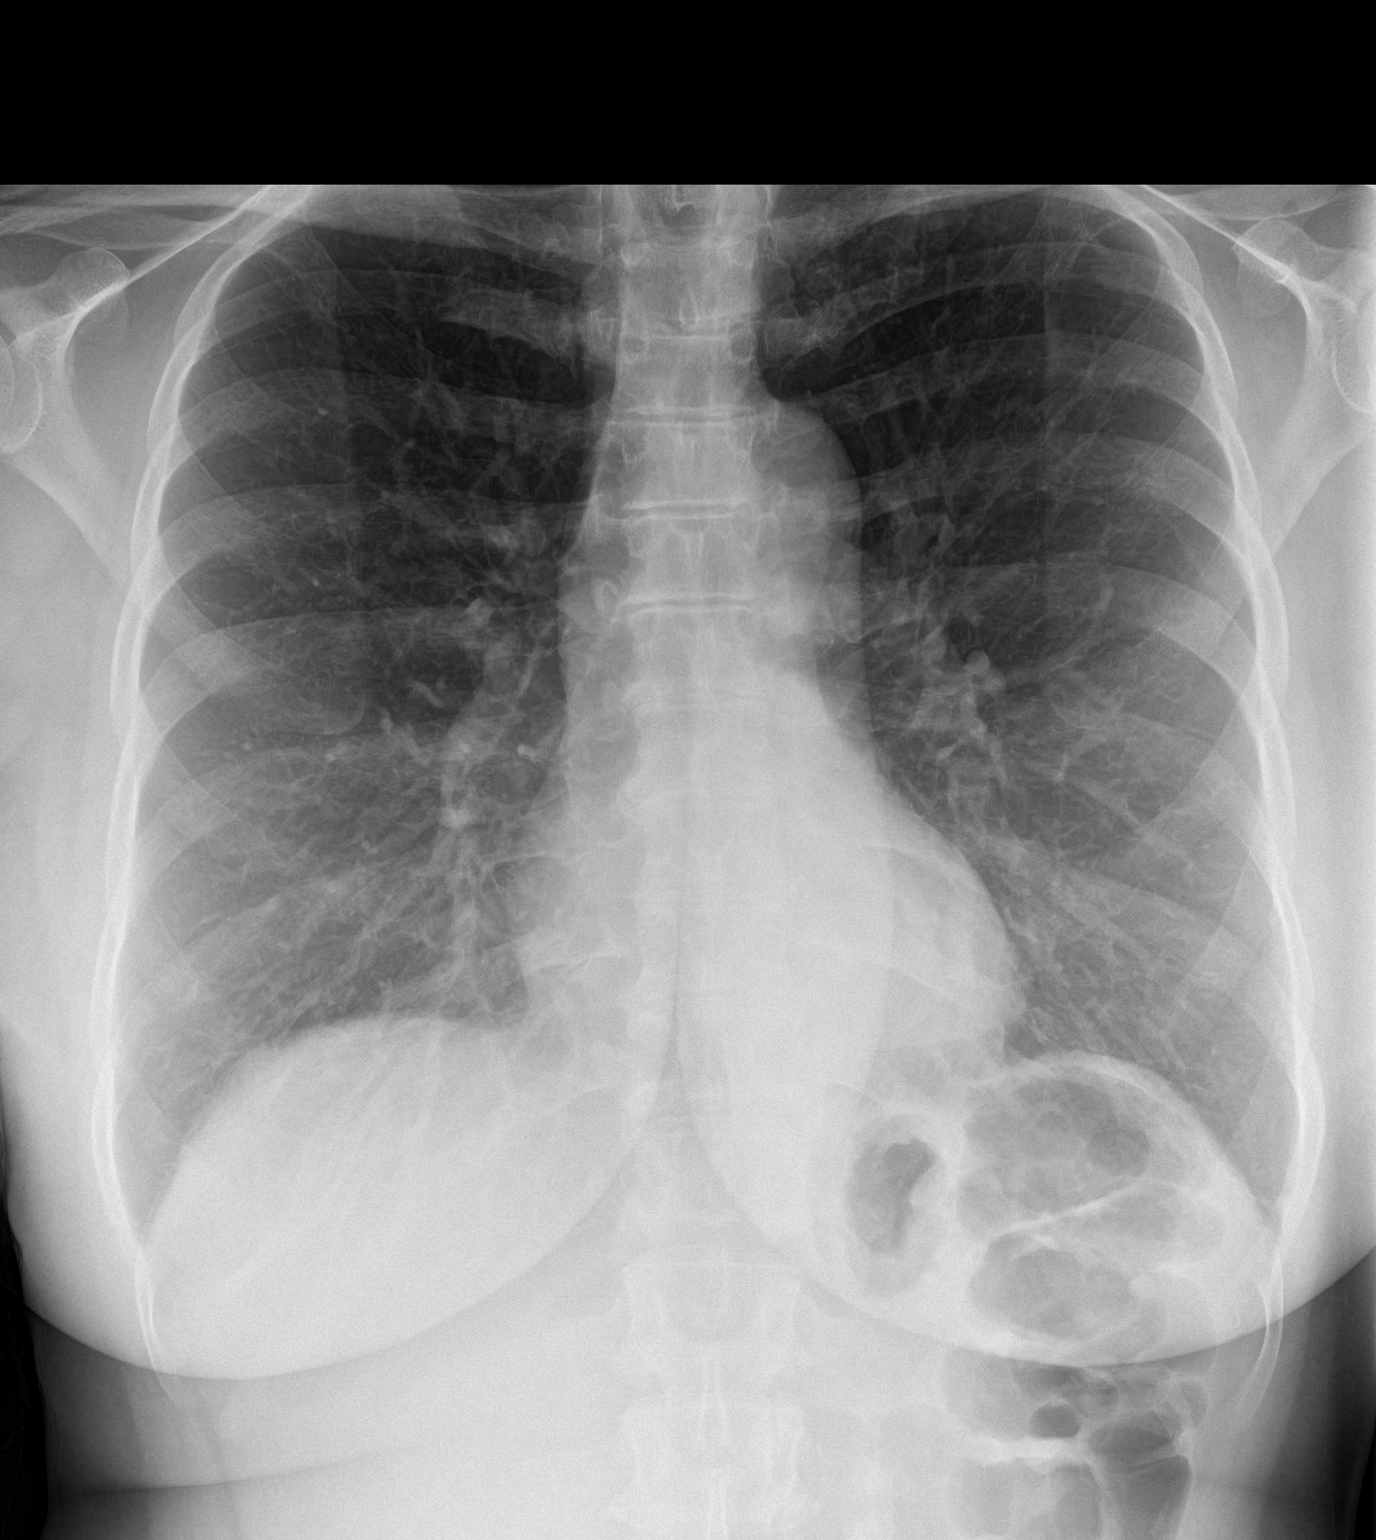
[im 2/2]
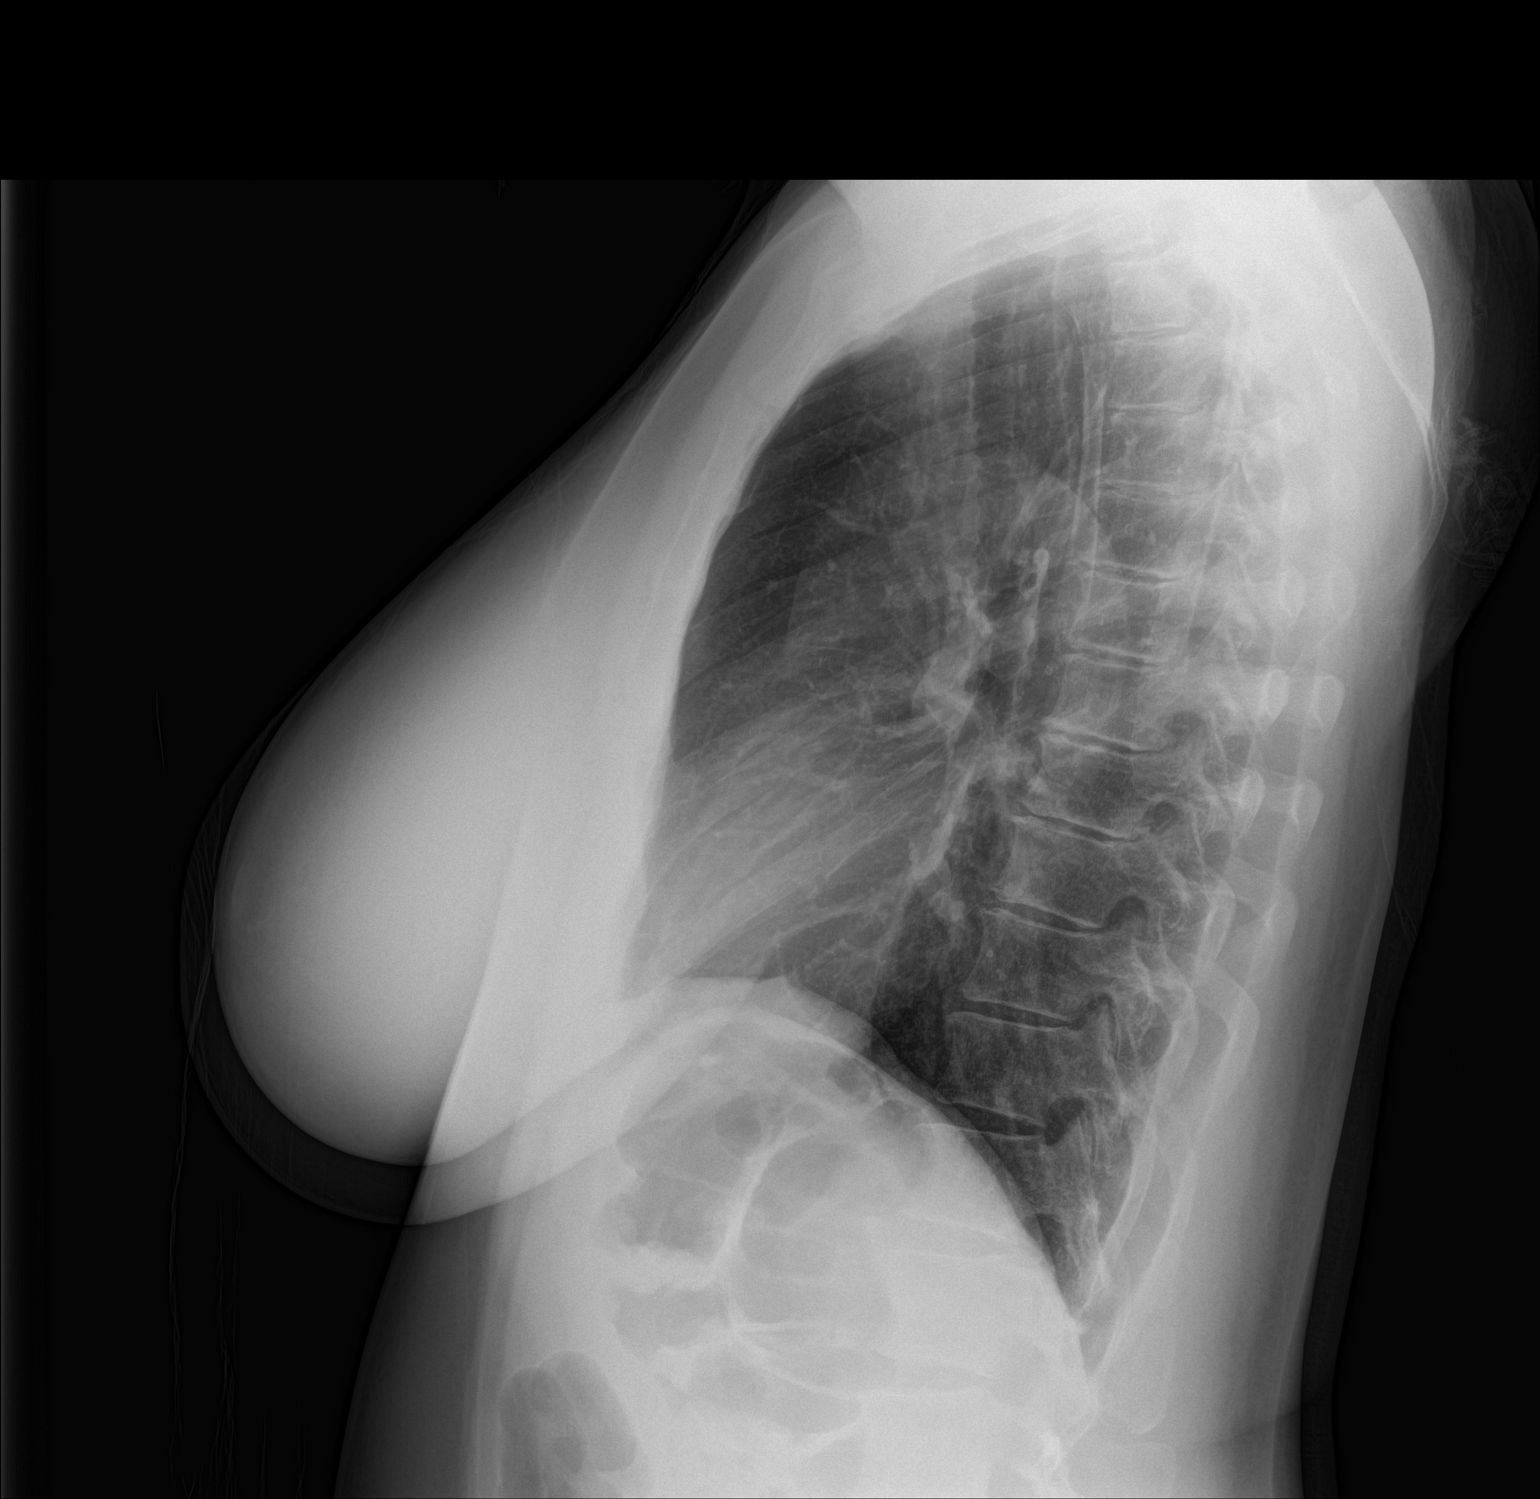

[2 of 2 positions shown; findings below may reference images not displayed]

FINDINGS: The heart size and mediastinal contours are within normal limits.
Both lungs are clear. The visualized skeletal structures are
unremarkable.
IMPRESSION: No active cardiopulmonary disease.

## 2016-12-21 ENCOUNTER — Encounter: Payer: Self-pay | Admitting: Gynecology

## 2016-12-21 ENCOUNTER — Ambulatory Visit (INDEPENDENT_AMBULATORY_CARE_PROVIDER_SITE_OTHER): Payer: 59 | Admitting: Gynecology

## 2016-12-21 VITALS — BP 116/74

## 2016-12-21 DIAGNOSIS — R300 Dysuria: Secondary | ICD-10-CM | POA: Diagnosis not present

## 2016-12-21 DIAGNOSIS — D259 Leiomyoma of uterus, unspecified: Secondary | ICD-10-CM

## 2016-12-21 DIAGNOSIS — N898 Other specified noninflammatory disorders of vagina: Secondary | ICD-10-CM

## 2016-12-21 DIAGNOSIS — N951 Menopausal and female climacteric states: Secondary | ICD-10-CM | POA: Diagnosis not present

## 2016-12-21 DIAGNOSIS — L298 Other pruritus: Secondary | ICD-10-CM

## 2016-12-21 LAB — WET PREP FOR TRICH, YEAST, CLUE

## 2016-12-21 NOTE — Patient Instructions (Signed)
Take the one Diflucan pill. Call if your symptoms persist.

## 2016-12-21 NOTE — Progress Notes (Signed)
    Peggy Hicks [birth year] [id number]        51 y.o.  G2P2002 presents with several days of vaginal itching and slight discharge. No odor. Having some low back pain but no fever chills nausea vomiting diarrhea constipation. No urinary symptoms such as frequency dysuria or urgency.  Past medical history,surgical history, problem list, medications, allergies, family history and social history were all reviewed and documented in the EPIC chart.  Directed ROS with pertinent positives and negatives documented in the history of present illness/assessment and plan.  Exam: [name] assistant Vitals:   12/21/16 1407  BP: 116/74   General appearance:  Normal Abdomen soft nontender without masses guarding rebound Pelvic external BUS vagina with white cakey discharge. Cervix normal. Uterus bulky 14+ weeks irregular consistent with her history of leiomyoma. Adnexa difficult to evaluate but no gross masses or tenderness.  Assessment/Plan:  51 y.o. [id number] with history and exam as above. But prep is negative. Clinically it appears his classic yeast. Will treat with Diflucan 150 mg 1 dose. Patient will follow up if symptoms persist, worsen or recur. Her leiomyoma appears stable. On questioning she is not bothered by them other than during her menses she has a little extra cramping but no heavy flows, intermenstrual bleeding or intermenstrual pelvic pain/dyspareunia.  Notes that she's having some hot flushes and sweats. Still having regular menses. Discussed abnormal TSH this year. Discussed options to include observation, OTC products as well as HRT in general. It's point she's going to monitor she will follow up if her symptoms become more bothersome.  Greater than 50% of my time was spent in direct face to face counseling and coordination of care with the patient.     Anastasio Auerbach MD, 2:31 PM 12/21/2016

## 2016-12-21 NOTE — Addendum Note (Signed)
Addended by: Nelva Nay on: 12/21/2016 02:39 PM   Modules accepted: Orders

## 2016-12-22 LAB — NO CULTURE INDICATED

## 2016-12-22 LAB — URINALYSIS W MICROSCOPIC + REFLEX CULTURE
Bacteria, UA: NONE SEEN /HPF
Bilirubin Urine: NEGATIVE
Glucose, UA: NEGATIVE
Hgb urine dipstick: NEGATIVE
Hyaline Cast: NONE SEEN /LPF
Ketones, ur: NEGATIVE
Leukocyte Esterase: NEGATIVE
Nitrites, Initial: NEGATIVE
Protein, ur: NEGATIVE
RBC / HPF: NONE SEEN /HPF (ref 0–2)
Specific Gravity, Urine: 1.004 (ref 1.001–1.03)
WBC, UA: NONE SEEN /HPF (ref 0–5)
pH: 6 (ref 5.0–8.0)

## 2016-12-25 ENCOUNTER — Encounter: Payer: Self-pay | Admitting: Gynecology

## 2016-12-26 MED ORDER — FLUCONAZOLE 150 MG PO TABS
150.0000 mg | ORAL_TABLET | Freq: Once | ORAL | 0 refills | Status: AC
Start: 2016-12-26 — End: 2016-12-26

## 2016-12-28 ENCOUNTER — Encounter: Payer: Self-pay | Admitting: Gynecology

## 2016-12-28 NOTE — Telephone Encounter (Signed)
Bacteria is always reported on a wet prep as normal bacteria lives in the vagina. The patient did not have excess that would be required to be diagnosing a bacterial type infection. We are treating her for yeast because clinically it appeared to be yeast on exam despite not seeing on the wet prep. I talked to her about this at the office visit actually telling her we did not see yeast on treating her for it because it appears to be that clinically.

## 2017-01-05 ENCOUNTER — Encounter: Payer: Self-pay | Admitting: Gynecology

## 2017-01-05 ENCOUNTER — Other Ambulatory Visit: Payer: Self-pay | Admitting: Family Medicine

## 2017-01-05 MED ORDER — ALPRAZOLAM 0.5 MG PO TABS: 0.5000 mg | ORAL_TABLET | Freq: Every day | ORAL | 0 refills | Status: DC | PRN

## 2017-01-05 MED ORDER — CLINDAMYCIN PHOSPHATE 2 % VA CREA: 1.0000 | TOPICAL_CREAM | Freq: Every day | VAGINAL | 0 refills | Status: DC

## 2017-01-05 NOTE — Telephone Encounter (Signed)
Medication phoned to pharmacy.  

## 2017-01-05 NOTE — Telephone Encounter (Signed)
Rx sent 

## 2017-01-05 NOTE — Telephone Encounter (Signed)
Px written for call in   

## 2017-01-05 NOTE — Telephone Encounter (Signed)
Recommend Cleocin vaginal cream nightly 7 nights.

## 2017-01-05 NOTE — Telephone Encounter (Signed)
Pt has CPE scheduled on 04/27/17, last filled on 09/04/16 #30 tabs with 0 refills,

## 2017-02-26 ENCOUNTER — Other Ambulatory Visit: Payer: Self-pay | Admitting: Gynecology

## 2017-02-26 NOTE — Telephone Encounter (Signed)
Received request from pharmacy for both Diflucan and Cleocin vaginal cream.  Is she having symptoms and if so what are they.

## 2017-02-27 ENCOUNTER — Other Ambulatory Visit: Payer: Self-pay | Admitting: Gynecology

## 2017-04-27 ENCOUNTER — Ambulatory Visit (INDEPENDENT_AMBULATORY_CARE_PROVIDER_SITE_OTHER): Payer: Managed Care, Other (non HMO) | Admitting: Family Medicine

## 2017-04-27 ENCOUNTER — Encounter: Payer: Self-pay | Admitting: Family Medicine

## 2017-04-27 VITALS — BP 122/70 | HR 58 | Temp 98.7°F | Ht 65.5 in | Wt 181.5 lb

## 2017-04-27 DIAGNOSIS — R739 Hyperglycemia, unspecified: Secondary | ICD-10-CM | POA: Diagnosis not present

## 2017-04-27 DIAGNOSIS — Z0000 Encounter for general adult medical examination without abnormal findings: Secondary | ICD-10-CM | POA: Diagnosis not present

## 2017-04-27 DIAGNOSIS — F430 Acute stress reaction: Secondary | ICD-10-CM | POA: Diagnosis not present
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
## 2017-04-27 NOTE — Assessment & Plan Note (Signed)
A1C today  Pt continues good exercise  disc imp of low glycemic diet and wt loss to prevent DM2

## 2017-04-27 NOTE — Assessment & Plan Note (Signed)
Pt is doing ok right now  Reviewed stressors/ coping techniques/symptoms/ support sources/ tx options and side effects in detail today  Takes xanax infrequently - warned of sedation and habit

## 2017-04-27 NOTE — Patient Instructions (Addendum)
Start taking vitamin D3 over the counter 2000 iu daily  Keep exercising  Start eating better  Try to get most of your carbohydrates from produce (with the exception of white potatoes)  Eat less bread/pasta/rice/snack foods/cereals/sweets and other items from the middle of the grocery store (processed carbs)   Labs today

## 2017-04-27 NOTE — Assessment & Plan Note (Signed)
Reviewed health habits including diet and exercise and skin cancer prevention Reviewed appropriate screening tests for age  Also reviewed health mt list, fam hx and immunization status , as well as social and family history   See HPI Labs drawn incl A1C Handout on prev care given  Gyn f/u next mo as planned

## 2017-04-28 LAB — CBC WITH DIFFERENTIAL/PLATELET
Basophils Absolute: 0 10*3/uL (ref 0.0–0.2)
Basos: 0 %
EOS (ABSOLUTE): 0.1 10*3/uL (ref 0.0–0.4)
Eos: 1 %
Hematocrit: 40.3 % (ref 34.0–46.6)
Hemoglobin: 13.2 g/dL (ref 11.1–15.9)
Immature Grans (Abs): 0 10*3/uL (ref 0.0–0.1)
Immature Granulocytes: 0 %
Lymphocytes Absolute: 2.9 10*3/uL (ref 0.7–3.1)
Lymphs: 44 %
MCH: 28.2 pg (ref 26.6–33.0)
MCHC: 32.8 g/dL (ref 31.5–35.7)
MCV: 86 fL (ref 79–97)
Monocytes Absolute: 0.5 10*3/uL (ref 0.1–0.9)
Monocytes: 7 %
Neutrophils Absolute: 3.2 10*3/uL (ref 1.4–7.0)
Neutrophils: 48 %
Platelets: 326 10*3/uL (ref 150–379)
RBC: 4.68 x10E6/uL (ref 3.77–5.28)
RDW: 14.6 % (ref 12.3–15.4)
WBC: 6.6 10*3/uL (ref 3.4–10.8)

## 2017-04-28 LAB — COMPREHENSIVE METABOLIC PANEL
ALT: 12 IU/L (ref 0–32)
AST: 14 IU/L (ref 0–40)
Albumin/Globulin Ratio: 1.7 (ref 1.2–2.2)
Albumin: 4.3 g/dL (ref 3.5–5.5)
Alkaline Phosphatase: 63 IU/L (ref 39–117)
BUN/Creatinine Ratio: 13 (ref 9–23)
BUN: 11 mg/dL (ref 6–24)
Bilirubin Total: 0.5 mg/dL (ref 0.0–1.2)
CO2: 24 mmol/L (ref 20–29)
Calcium: 9.7 mg/dL (ref 8.7–10.2)
Chloride: 99 mmol/L (ref 96–106)
Creatinine, Ser: 0.85 mg/dL (ref 0.57–1.00)
GFR calc Af Amer: 90 mL/min/{1.73_m2} (ref >59)
GFR calc non Af Amer: 78 mL/min/{1.73_m2} (ref >59)
Globulin, Total: 2.6 g/dL (ref 1.5–4.5)
Glucose: 91 mg/dL (ref 65–99)
Potassium: 4.8 mmol/L (ref 3.5–5.2)
Sodium: 137 mmol/L (ref 134–144)
Total Protein: 6.9 g/dL (ref 6.0–8.5)

## 2017-04-28 LAB — TSH: TSH: 1.49 u[IU]/mL (ref 0.450–4.500)

## 2017-04-28 LAB — LIPID PANEL
Chol/HDL Ratio: 2.5 ratio (ref 0.0–4.4)
Cholesterol, Total: 239 mg/dL — ABNORMAL HIGH (ref 100–199)
HDL: 95 mg/dL (ref >39)
LDL Calculated: 120 mg/dL — ABNORMAL HIGH (ref 0–99)
Triglycerides: 120 mg/dL (ref 0–149)
VLDL Cholesterol Cal: 24 mg/dL (ref 5–40)

## 2017-04-28 LAB — HEMOGLOBIN A1C
Est. average glucose Bld gHb Est-mCnc: 123 mg/dL
Hgb A1c MFr Bld: 5.9 % — ABNORMAL HIGH (ref 4.8–5.6)

## 2017-05-30 ENCOUNTER — Encounter: Payer: Self-pay | Admitting: Gynecology

## 2017-05-30 ENCOUNTER — Ambulatory Visit: Payer: Managed Care, Other (non HMO) | Admitting: Gynecology

## 2017-05-30 VITALS — BP 118/78 | Ht 65.0 in | Wt 180.0 lb

## 2017-05-30 DIAGNOSIS — N951 Menopausal and female climacteric states: Secondary | ICD-10-CM

## 2017-05-30 DIAGNOSIS — Z01411 Encounter for gynecological examination (general) (routine) with abnormal findings: Secondary | ICD-10-CM | POA: Diagnosis not present

## 2017-05-30 NOTE — Patient Instructions (Signed)
Follow-up in 1 year for annual exam, sooner if any issues. 

## 2017-05-30 NOTE — Progress Notes (Signed)
    Peggy Hicks Apr 14, 1965 [id number]        51 y.o.  G2P2002 for annual gynecologic exam.  Some menopausal symptoms with hot flushes and sweats but overall doing well with regular monthly menses.  Past medical history,surgical history, problem list, medications, allergies, family history and social history were all reviewed and documented as reviewed in the EPIC chart.  ROS:  Performed with pertinent positives and negatives included in the history, assessment and plan.   Additional significant findings : None   Exam: [name] assistant Vitals:   05/30/17 0818  BP: 118/78  Weight: 180 lb (81.6 kg)  Height: 5\' 5"  (1.651 m)   Body mass index is 29.95 kg/m.  General appearance:  Normal affect, orientation and appearance. Skin: Grossly normal HEENT: Without gross lesions.  No cervical or supraclavicular adenopathy. Thyroid normal.  Lungs:  Clear without wheezing, rales or rhonchi Cardiac: RR, without RMG Abdominal:  Soft, nontender, without masses, guarding, rebound, organomegaly or hernia Breasts:  Examined lying and sitting without masses, retractions, discharge or axillary adenopathy. Pelvic:  Ext, BUS, Vagina: Normal  Cervix: Normal  Uterus: 14 weeks irregular midline mobile consistent with history of leiomyoma  Adnexa: Without gross masses or tenderness    Anus and perineum: Normal   Rectovaginal: Normal sphincter tone without palpated masses or tenderness.    Assessment/Plan:  52 y.o. G75P2002 female for annual gynecologic exam with regular menses, tubal sterilization.   1. Menopausal symptoms.  Patient continues with some hot flushes and sweats.  Continues with menses once monthly.  No prolonged or atypical bleeding.  Discussed options to include observation, OTC products and HRT.  At this point patient wants to try OTC products.  We will follow-up if symptoms worsen and wants to rediscuss treatment options. 2. Leiomyoma.  Uterus overall stable by exam.  Patient is  asymptomatic.  We have discussed this multiple times as far as surgery versus observation and at this point she is comfortable with observation understanding the issues of missed pathology. 3. Mammography 10/2016.  Continue with annual mammography this coming summer.  Breast exam normal today. 4. Pap smear/HPV 05/2013.  No Pap smear done today.  Will plan Pap smear next year at 5-year interval per current screening guidelines.  No history of abnormal Pap smears previously. 5. Colonoscopy 2016.  Repeat at their recommended interval. 6. Health maintenance.  Patient reports blood work done elsewhere.  Follow-up 1 year, sooner as needed.   Anastasio Auerbach MD, 8:46 AM 05/30/2017

## 2017-06-29 ENCOUNTER — Ambulatory Visit: Payer: Managed Care, Other (non HMO) | Admitting: Family Medicine

## 2017-06-29 ENCOUNTER — Encounter: Payer: Self-pay | Admitting: Family Medicine

## 2017-06-29 VITALS — BP 126/82 | HR 63 | Temp 98.2°F | Ht 65.0 in | Wt 181.8 lb

## 2017-06-29 DIAGNOSIS — R51 Headache: Secondary | ICD-10-CM | POA: Diagnosis not present

## 2017-06-29 DIAGNOSIS — F430 Acute stress reaction: Secondary | ICD-10-CM | POA: Diagnosis not present

## 2017-06-29 DIAGNOSIS — R519 Headache, unspecified: Secondary | ICD-10-CM | POA: Insufficient documentation

## 2017-06-29 MED ORDER — PREDNISONE 10 MG PO TABS: ORAL_TABLET | ORAL | 0 refills | Status: DC

## 2017-06-29 MED ORDER — METHOCARBAMOL 500 MG PO TABS: 500.0000 mg | ORAL_TABLET | Freq: Three times a day (TID) | ORAL | 1 refills | Status: DC | PRN
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
## 2017-06-29 NOTE — Patient Instructions (Addendum)
Drink fluids Avoid caffeine unless you have a headache  Take the prednisone taper as directed   For rescue medication - be less reliant on the excedrin migraine  Try the methocarbamol (muscle relaxer) as needed (try your first dose at night to see how sedating it is)  If not better /improved in 7-10 days please call   Do start the process of correcting things at work

## 2017-07-01 NOTE — Assessment & Plan Note (Signed)
Clear that something needs to change in the workplace or she needs to change jobs Reviewed stressors/ coping techniques/symptoms/ support sources/ tx options and side effects in detail today  Disc plan to work on stressors Declines counseling

## 2017-07-01 NOTE — Assessment & Plan Note (Addendum)
Suspect migraine w/o aura - with triggers including allergies and stress Poss analgesic rebound Will cut back on excedrin migraine and try methocarbamol for headache Lifestyle change/handout given  Prednisone taper to break cycle (disc side eff)  Doubt her pelvic pain is related-she will f/u with gyn Update if not starting to improve in a week or if worsening   >25 minutes spent in face to face time with patient, >50% spent in counselling or coordination of care-including lifestyle change such as caffeine avoidance and inc fluids and regular exercise

## 2017-07-10 ENCOUNTER — Ambulatory Visit: Payer: Managed Care, Other (non HMO) | Admitting: Family Medicine

## 2017-07-10 ENCOUNTER — Encounter: Payer: Self-pay | Admitting: Family Medicine

## 2017-07-10 ENCOUNTER — Ambulatory Visit: Payer: Self-pay

## 2017-07-10 VITALS — BP 110/66 | HR 65 | Temp 98.2°F | Ht 65.0 in | Wt 181.8 lb

## 2017-07-10 DIAGNOSIS — R0789 Other chest pain: Secondary | ICD-10-CM

## 2017-07-10 DIAGNOSIS — J019 Acute sinusitis, unspecified: Secondary | ICD-10-CM | POA: Insufficient documentation

## 2017-07-10 DIAGNOSIS — J0100 Acute maxillary sinusitis, unspecified: Secondary | ICD-10-CM | POA: Diagnosis not present

## 2017-07-10 DIAGNOSIS — R51 Headache: Secondary | ICD-10-CM | POA: Diagnosis not present

## 2017-07-10 DIAGNOSIS — F430 Acute stress reaction: Secondary | ICD-10-CM

## 2017-07-10 DIAGNOSIS — R519 Headache, unspecified: Secondary | ICD-10-CM

## 2017-07-10 MED ORDER — AMOXICILLIN-POT CLAVULANATE 875-125 MG PO TABS: 1.0000 | ORAL_TABLET | Freq: Two times a day (BID) | ORAL | 0 refills | Status: DC

## 2017-07-10 NOTE — Telephone Encounter (Signed)
Note patient was seen today at 4pm by Dr. Glori Bickers.

## 2017-07-10 NOTE — Progress Notes (Signed)
Subjective:    Patient ID: Peggy Hicks, female    DOB: 10-Jan-1966, 52 y.o.   MRN: [id number]  HPI  Here for resp symptoms and f/u from last visit for headaches and stress reaction   Wt Readings from Last 3 Encounters:  07/10/17 181 lb 12 oz (82.4 kg)  06/29/17 181 lb 12 oz (82.4 kg)  05/30/17 180 lb (81.6 kg)  30.24 kg/m   Last visit given prednisone taper to stop a headache cycle The pharmacy gave her double the dose (60 mg instead of 30) She has had more anxiety and hot flashes  Headache is better overall   Chest congestion w/o cough Feels full  Pain to take a deep breath- in the front   Pulse ox is 99% RA BP: 110/66   Pulse Rate: 65    Facial pressure- worse around her eyes  Not a lot of congestion  Some post nasal drip and throat clearing- mild but noticeable   Ear pain - R side is aching now   Took some mucinex  Patient Active Problem List   Diagnosis Date Noted  . Acute sinusitis 07/10/2017  . Chest wall discomfort 07/10/2017  . Daily headache 06/29/2017  . Allergic rhinitis 02/02/2015  . Hx of adenomatous colonic polyps 04/14/2014  . Left lumbar radiculopathy 12/10/2012  . Routine general medical examination at a health care facility 01/01/2012  . Stress reaction 11/13/2011  . Hyperglycemia 11/15/2010  . PRIMARY FOCAL HYPERHIDROSIS 10/22/2007  . Situational anxiety 08/01/2006   Past Medical History:  Diagnosis Date  . Fibroids   . Hx of adenomatous colonic polyps 04/14/2014  . Hyperhidrosis   . Stress reaction    Past Surgical History:  Procedure Laterality Date  . BREAST BIOPSY    . BREAST SURGERY     Breast Reduction  . CESAREAN SECTION      X 2  . HYSTEROSCOPY    . MYOMECTOMY     Uterine fibroids removed/ hysteroscopic submucous myomectomy  . TUBAL LIGATION     Social History   Tobacco Use  . Smoking status: Never Smoker  . Smokeless tobacco: Never Used  Substance Use Topics  . Alcohol use: Yes    Alcohol/week: 0.0 oz   Comment: Rare  . Drug use: No   Family History  Problem Relation Age of Onset  . Hypertension Mother   . Heart disease Mother   . Diabetes Mother   . Hypertension Father   . Heart disease Father   . Cancer Maternal Aunt        liver  . Colon cancer Neg Hx   . Esophageal cancer Neg Hx   . Rectal cancer Neg Hx   . Stomach cancer Neg Hx    No Known Allergies Current Outpatient Medications on File Prior to Visit  Medication Sig Dispense Refill  . ALPRAZolam (XANAX) 0.5 MG tablet Take 1 tablet (0.5 mg total) by mouth daily as needed for anxiety. For traveling 30 tablet 0  . Biotin 10 MG CAPS Take 1 capsule by mouth daily.    . fluticasone (FLONASE) 50 MCG/ACT nasal spray Place 2 sprays into both nostrils daily. 16 g 11  . ibuprofen (ADVIL,MOTRIN) 600 MG tablet Take 1 tablet (600 mg total) by mouth every 8 (eight) hours as needed. 60 tablet 3  . methocarbamol (ROBAXIN) 500 MG tablet Take 1 tablet (500 mg total) by mouth 3 (three) times daily as needed for muscle spasms. Headache 30 tablet 1  . Multiple  Vitamins-Minerals (MULTIVITAMIN PO) Take 1 tablet by mouth daily. Women's multivitamin daily     No current facility-administered medications on file prior to visit.     Review of Systems  Constitutional: Positive for fatigue. Negative for activity change, appetite change, fever and unexpected weight change.  HENT: Positive for sinus pressure and sinus pain. Negative for congestion, ear pain, rhinorrhea, sore throat and trouble swallowing.   Eyes: Negative for pain, redness and visual disturbance.  Respiratory: Positive for chest tightness. Negative for cough, shortness of breath, wheezing and stridor.   Cardiovascular: Positive for chest pain. Negative for palpitations and leg swelling.  Gastrointestinal: Negative for abdominal pain, blood in stool, constipation and diarrhea.  Endocrine: Negative for polydipsia and polyuria.  Genitourinary: Negative for dysuria, frequency and urgency.    Musculoskeletal: Negative for arthralgias, back pain and myalgias.  Skin: Negative for pallor and rash.  Allergic/Immunologic: Negative for environmental allergies.  Neurological: Positive for light-headedness and headaches. Negative for dizziness and syncope.  Hematological: Negative for adenopathy. Does not bruise/bleed easily.  Psychiatric/Behavioral: Negative for decreased concentration and dysphoric mood. The patient is nervous/anxious. The patient is not hyperactive.        Objective:   Physical Exam  Constitutional: She appears well-developed and well-nourished. No distress.  Well appearing   HENT:  Head: Normocephalic and atraumatic.  Right Ear: External ear normal.  Left Ear: External ear normal.  Mouth/Throat: Oropharynx is clear and moist. No oropharyngeal exudate.  Nares are boggy and clear  Bilateral maxillary sinus tenderness  Post nasal drip   Eyes: Pupils are equal, round, and reactive to light. Conjunctivae and EOM are normal. Right eye exhibits no discharge. Left eye exhibits no discharge.  Neck: Normal range of motion. Neck supple.  Cardiovascular: Normal rate and regular rhythm.  Pulmonary/Chest: Effort normal and breath sounds normal. No respiratory distress. She has no wheezes. She has no rales. She exhibits tenderness.  Good air exch  Sternal tenderness w/o crepitus or skin change   Abdominal: Soft. Bowel sounds are normal. She exhibits no distension and no mass. There is no tenderness. There is no rebound and no guarding.  Lymphadenopathy:    She has no cervical adenopathy.  Neurological: She is alert. She displays no tremor. No cranial nerve deficit. She exhibits normal muscle tone. Coordination normal.  Skin: Skin is warm and dry. No rash noted. No pallor.  Psychiatric: Her mood appears anxious. Her affect is not blunt, not labile and not inappropriate. She does not exhibit a depressed mood.          Assessment & Plan:   Problem List Items  Addressed This Visit      Respiratory   Acute sinusitis - Primary    Worsening sinus pressure but not a lot of congestion  Also malaise and sone ear pain  tx with augmentin  mucinex prn  Update       Relevant Medications   amoxicillin-clavulanate (AUGMENTIN) 875-125 MG tablet     Other   Chest wall discomfort    With tenderness on exam  -mid sternum  Improved from Friday Also exp inc anx from recent prednisone  Recommend warm compress and analgesics  Update if not starting to improve in a week or if worsening  (esp if chest congestion/cough or fever dev)      Daily headache    Improved after course of prednisone      Stress reaction    Worse with prednisone -now improving (symptoms of anxiety)

## 2017-07-10 NOTE — Assessment & Plan Note (Signed)
Worse with prednisone -now improving (symptoms of anxiety)

## 2017-07-10 NOTE — Assessment & Plan Note (Signed)
Improved after course of prednisone

## 2017-07-10 NOTE — Telephone Encounter (Signed)
Pt called with c/o chest congestion and she feels pain when she takes a deep breath at the top and center of her chest. Denies radiation to other areas. She describes the pain as mild. She states that she is having chest congestion. She stated that sx began yesterday. She c/o of earache and feels pressure in her head. She states that she fells "a little SOB" at times. Pt states that she feels hot allt the time and runs her A/C at night. She thinks it premenopausal sx. Protocol advises to have pt seen within 24 hours. Appt made for today with her PCP.   Reason for Disposition . [1] Chest pain lasting <= 5 minutes AND [2] NO chest pain or cardiac symptoms now(Exceptions: pains lasting a few seconds) . Taking a deep breath makes pain worse  Answer Assessment - Initial Assessment Questions 1. LOCATION: "Where does it hurt?"      Center at the top of chest 2. RADIATION: "Does the pain go anywhere else?" (e.g., into neck, jaw, arms, back)     no 3. ONSET: "When did the chest pain begin?" (Minutes, hours or days)      yesterday 4. PATTERN "Does the pain come and go, or has it been constant since it started?"  "Does it get worse with exertion?"      Not a constant pain but pt feels it when she takes a deep breath 5. DURATION: "How long does it last" (e.g., seconds, minutes, hours)     Couple of seconds 6. SEVERITY: "How bad is the pain?"  (e.g., Scale 1-10; mild, moderate, or severe)    - MILD (1-3): doesn't interfere with normal activities     - MODERATE (4-7): interferes with normal activities or awakens from sleep    - SEVERE (8-10): excruciating pain, unable to do any normal activities       Mild  7. CARDIAC RISK FACTORS: "Do you have any history of heart problems or risk factors for heart disease?" (e.g., prior heart attack, angina; high blood pressure, diabetes, being overweight, high cholesterol, smoking, or strong family history of heart disease)     Mother with pacemaker 8. PULMONARY RISK  FACTORS: "Do you have any history of lung disease?"  (e.g., blood clots in lung, asthma, emphysema, birth control pills)     no 9. CAUSE: "What do you think is causing the chest pain?"     Thinks it premenopausal 10. OTHER SYMPTOMS: "Do you have any other symptoms?" (e.g., dizziness, nausea, vomiting, sweating, fever, difficulty breathing, cough)       Ears are aching, chest congestion, feels extremely hot, sweaty, "a little SOB" feels pressure in her head, right ear ache 11. PREGNANCY: "Is there any chance you are pregnant?" "When was your last menstrual period?"       N/a LMP: March had spotting March 22 to March 27th  Protocols used: CHEST PAIN-A-AH

## 2017-07-10 NOTE — Assessment & Plan Note (Signed)
With tenderness on exam  -mid sternum  Improved from Friday Also exp inc anx from recent prednisone  Recommend warm compress and analgesics  Update if not starting to improve in a week or if worsening  (esp if chest congestion/cough or fever dev)

## 2017-07-10 NOTE — Patient Instructions (Addendum)
I think you have a sinus infection  You can try nasal saline irritation (simply saline over the counter) to help flush out sinuses  Take the augmentin as directed  You may develop a cough-keep [place] posted   Warm compresses on your chest may help also   I expect you should feel better as you get farther out from the prednisone as well   Try to get some rest   mucinex is fine for congestion as well   Update if not starting to improve in a week or if worsening

## 2017-07-10 NOTE — Assessment & Plan Note (Signed)
Worsening sinus pressure but not a lot of congestion  Also malaise and sone ear pain  tx with augmentin  mucinex prn  Update

## 2017-09-21 ENCOUNTER — Other Ambulatory Visit: Payer: Self-pay | Admitting: *Deleted

## 2017-09-21 MED ORDER — ALPRAZOLAM 0.5 MG PO TABS: 0.5000 mg | ORAL_TABLET | Freq: Every day | ORAL | 0 refills | Status: DC | PRN

## 2017-09-21 NOTE — Telephone Encounter (Signed)
Last OV was 07/10/17, last filled on 01/05/17 #30 tabs with 0 refills

## 2017-10-22 ENCOUNTER — Encounter: Payer: Self-pay | Admitting: Family Medicine

## 2017-11-14 ENCOUNTER — Encounter: Payer: Self-pay | Admitting: Family Medicine

## 2017-11-21 NOTE — Telephone Encounter (Signed)
Letter done for pt  In IN box  Thanks

## 2017-11-21 NOTE — Telephone Encounter (Signed)
Left VM requesting pt to let us know if she wants the letter picked up, mailed or is she going to print a copy off of mychart

## 2018-02-04 ENCOUNTER — Ambulatory Visit: Payer: Managed Care, Other (non HMO) | Admitting: Internal Medicine

## 2018-02-04 ENCOUNTER — Encounter: Payer: Self-pay | Admitting: Internal Medicine

## 2018-02-04 VITALS — BP 110/80 | HR 65 | Temp 98.3°F | Wt 181.0 lb

## 2018-02-04 DIAGNOSIS — J302 Other seasonal allergic rhinitis: Secondary | ICD-10-CM | POA: Diagnosis not present

## 2018-02-04 DIAGNOSIS — R05 Cough: Secondary | ICD-10-CM | POA: Diagnosis not present

## 2018-02-04 DIAGNOSIS — R059 Cough, unspecified: Secondary | ICD-10-CM

## 2018-02-04 MED ORDER — BENZONATATE 200 MG PO CAPS: 200.0000 mg | ORAL_CAPSULE | Freq: Two times a day (BID) | ORAL | 0 refills | Status: DC | PRN

## 2018-02-04 MED ORDER — HYDROCOD POLST-CPM POLST ER 10-8 MG/5ML PO SUER: 5.0000 mL | Freq: Every evening | ORAL | 0 refills | Status: DC | PRN

## 2018-02-04 NOTE — Progress Notes (Signed)
HPI  Pt presents to the clinic today with c/o ear pain, sore throat and cough. She reports this started 3-4 days ago. The ear pain she describes as sore and achy. She denies ear drainage or loss of heating. She denies difficulty swallowing. The cough is productive of yellow mucous. She denies fever, chills, body aches or shortness of breath. She has tried Mucinex and Copywriter, advertising with minimal relief. She has a history of seasonal allergies. She has not had sick contacts that she is aware of.  Review of Systems      Past Medical History:  Diagnosis Date  . Fibroids   . Hx of adenomatous colonic polyps 04/14/2014  . Hyperhidrosis   . Stress reaction     Family History  Problem Relation Age of Onset  . Hypertension Mother   . Heart disease Mother   . Diabetes Mother   . Hypertension Father   . Heart disease Father   . Cancer Maternal Aunt        liver  . Colon cancer Neg Hx   . Esophageal cancer Neg Hx   . Rectal cancer Neg Hx   . Stomach cancer Neg Hx     Social History   Socioeconomic History  . Marital status: Married    Spouse name: Not on file  . Number of children: Not on file  . Years of education: Not on file  . Highest education level: Not on file  Occupational History  . Not on file  Social Needs  . Financial resource strain: Not on file  . Food insecurity:    Worry: Not on file    Inability: Not on file  . Transportation needs:    Medical: Not on file    Non-medical: Not on file  Tobacco Use  . Smoking status: Never Smoker  . Smokeless tobacco: Never Used  Substance and Sexual Activity  . Alcohol use: Yes    Alcohol/week: 0.0 standard drinks    Comment: Rare  . Drug use: No  . Sexual activity: Yes    Birth control/protection: Surgical    Comment: Tubal lig-1st intercourse 52 yo--Fewer than 5 partners  Lifestyle  . Physical activity:    Days per week: Not on file    Minutes per session: Not on file  . Stress: Not on file  Relationships  . Social  connections:    Talks on phone: Not on file    Gets together: Not on file    Attends religious service: Not on file    Active member of club or organization: Not on file    Attends meetings of clubs or organizations: Not on file    Relationship status: Not on file  . Intimate partner violence:    Fear of current or ex partner: Not on file    Emotionally abused: Not on file    Physically abused: Not on file    Forced sexual activity: Not on file  Other Topics Concern  . Not on file  Social History Narrative   Exercises regularly    No Known Allergies   Constitutional: Denies headache, fatigue, fever or abrupt weight changes.  HEENT:  Positive ear pain, runny nose, sore throat. Denies eye redness, eye pain, pressure behind the eyes, facial pain, nasal congestion, ringing in the ears, wax buildup, runny nose or bloody nose. Respiratory: Positive cough. Denies difficulty breathing or shortness of breath.  Cardiovascular: Denies chest pain, chest tightness, palpitations or swelling in the hands or feet.  No other specific complaints in a complete review of systems (except as listed in HPI above).  Objective:   BP 110/80   Pulse 65   Temp 98.3 F (36.8 C) (Oral)   Wt 181 lb (82.1 kg)   SpO2 98%   BMI 30.12 kg/m   Wt Readings from Last 3 Encounters:  02/04/18 181 lb (82.1 kg)  07/10/17 181 lb 12 oz (82.4 kg)  06/29/17 181 lb 12 oz (82.4 kg)     General: Appears her stated age, well developed, well nourished in NAD. HEENT: Head: normal shape and size, no sinus tenderness noted;  Ears: Tm's gray and intact, normal light reflex; Nose: mucosa pink and moist, turbinates swollen; Throat/Mouth: + PND. Teeth present, mucosa pink and moist, no exudate noted, no lesions or ulcerations noted.  Neck: No cervical lymphadenopathy.  Cardiovascular: Normal rate and rhythm. S1,S2 noted.  No murmur, rubs or gallops noted.  Pulmonary/Chest: Normal effort and positive vesicular breath sounds.  No respiratory distress. No wheezes, rales or ronchi noted.       Assessment & Plan:   Allergic Rhinitis, Cough:  Get some rest and drink plenty of water Do salt water gargles for the sore throat Start Zyrtec and Flonase OTC eRx for Tessalon 200 mg TID prn eRx for Tussionex cough syrup  RTC as needed or if symptoms persist.   Webb Silversmith, NP

## 2018-02-04 NOTE — Patient Instructions (Signed)

## 2018-02-07 ENCOUNTER — Telehealth: Payer: Self-pay | Admitting: Family Medicine

## 2018-02-07 MED ORDER — AZITHROMYCIN 250 MG PO TABS: ORAL_TABLET | ORAL | 0 refills | Status: DC

## 2018-02-07 NOTE — Telephone Encounter (Signed)
Pt called stating she was seen on Monday. She is not feeling better yet, and she is going out of the country tomorrow. She was told to call in today to get a prescription for a antibiotic. Pt uses [facility] in [place].

## 2018-02-07 NOTE — Addendum Note (Signed)
Addended by: Jearld Fenton on: 02/07/2018 10:06 AM   Modules accepted: Orders

## 2018-02-07 NOTE — Telephone Encounter (Signed)
[facility]pack sent to [facility]

## 2018-02-07 NOTE — Telephone Encounter (Signed)
Pt is aware as instructed 

## 2018-03-04 ENCOUNTER — Telehealth: Payer: Self-pay

## 2018-03-04 NOTE — Telephone Encounter (Signed)
Pt left v/m requesting cb about persistent cough; pt seen 02/04/18 and at that visit noted if symptoms persist need to be seen. Pt needs to schedule appt.

## 2018-03-04 NOTE — Telephone Encounter (Signed)
Pt scheduled appt with Dr Glori Bickers 03/05/18 at 10 AM.

## 2018-03-05 ENCOUNTER — Ambulatory Visit: Payer: Managed Care, Other (non HMO) | Admitting: Family Medicine

## 2018-03-05 ENCOUNTER — Encounter: Payer: Self-pay | Admitting: Family Medicine

## 2018-03-05 VITALS — BP 112/62 | HR 66 | Temp 98.6°F | Ht 65.0 in | Wt 187.5 lb

## 2018-03-05 DIAGNOSIS — J0110 Acute frontal sinusitis, unspecified: Secondary | ICD-10-CM

## 2018-03-05 DIAGNOSIS — Z23 Encounter for immunization: Secondary | ICD-10-CM

## 2018-03-05 DIAGNOSIS — J019 Acute sinusitis, unspecified: Secondary | ICD-10-CM | POA: Insufficient documentation

## 2018-03-05 MED ORDER — AMOXICILLIN-POT CLAVULANATE 875-125 MG PO TABS: 1.0000 | ORAL_TABLET | Freq: Two times a day (BID) | ORAL | 0 refills | Status: DC

## 2018-03-05 MED ORDER — BENZONATATE 200 MG PO CAPS: 200.0000 mg | ORAL_CAPSULE | Freq: Three times a day (TID) | ORAL | 1 refills | Status: DC | PRN

## 2018-03-05 NOTE — Addendum Note (Signed)
Addended by: Tammi Sou on: 03/05/2018 03:36 PM   Modules accepted: Orders

## 2018-03-05 NOTE — Patient Instructions (Signed)
Drink lots of fluids Get back to flonase if it helps   Nasal saline spray over the counter may also help  Try the tessalon pills for cough  augmentin for the sinus infection   Try to rest   Flu shot today  Update if not starting to improve in a week or if worsening

## 2018-03-05 NOTE — Assessment & Plan Note (Signed)
S/p congestion from uri /allergies  Recommend re start flonase augmentin px  Also tessalon for cough  Expectorant prn otc  Update if not starting to improve in a week or if worsening

## 2018-03-05 NOTE — Progress Notes (Signed)
Subjective:    Patient ID: Peggy Hicks, female    DOB: 29-Jun-1965, 52 y.o.   MRN: [id number]  HPI Here for c/o cough and ear pain  Started in early November  Started with stuffy nose and ST (no fever) -took some otc cold and allergy medicine  Had traveled w/o flonase   She went on a trip to [place]   Now she has a cough - persistent dry cough  Has spells of it  No production  No wheezing or sob  Some headaches over eyes ? Sinus  L ear hurt on the plane (no d/c)- improved now    Patient Active Problem List   Diagnosis Date Noted  . Acute sinusitis 03/05/2018  . Chest wall discomfort 07/10/2017  . Daily headache 06/29/2017  . Hx of adenomatous colonic polyps 04/14/2014  . Left lumbar radiculopathy 12/10/2012  . Routine general medical examination at a health care facility 01/01/2012  . Stress reaction 11/13/2011  . Hyperglycemia 11/15/2010  . PRIMARY FOCAL HYPERHIDROSIS 10/22/2007  . Situational anxiety 08/01/2006   Past Medical History:  Diagnosis Date  . Fibroids   . Hx of adenomatous colonic polyps 04/14/2014  . Hyperhidrosis   . Stress reaction    Past Surgical History:  Procedure Laterality Date  . BREAST BIOPSY    . BREAST SURGERY     Breast Reduction  . CESAREAN SECTION      X 2  . HYSTEROSCOPY    . MYOMECTOMY     Uterine fibroids removed/ hysteroscopic submucous myomectomy  . TUBAL LIGATION     Social History   Tobacco Use  . Smoking status: Never Smoker  . Smokeless tobacco: Never Used  Substance Use Topics  . Alcohol use: Yes    Alcohol/week: 0.0 standard drinks    Comment: Rare  . Drug use: No   Family History  Problem Relation Age of Onset  . Hypertension Mother   . Heart disease Mother   . Diabetes Mother   . Hypertension Father   . Heart disease Father   . Cancer Maternal Aunt        liver  . Colon cancer Neg Hx   . Esophageal cancer Neg Hx   . Rectal cancer Neg Hx   . Stomach cancer Neg Hx    No Known Allergies Current  Outpatient Medications on File Prior to Visit  Medication Sig Dispense Refill  . ALPRAZolam (XANAX) 0.5 MG tablet Take 1 tablet (0.5 mg total) by mouth daily as needed for anxiety. For traveling 30 tablet 0  . methocarbamol (ROBAXIN) 500 MG tablet Take 1 tablet (500 mg total) by mouth 3 (three) times daily as needed for muscle spasms. Headache 30 tablet 1  . Multiple Vitamins-Minerals (MULTIVITAMIN PO) Take 1 tablet by mouth daily. Women's multivitamin daily     No current facility-administered medications on file prior to visit.     Review of Systems  Constitutional: Positive for appetite change. Negative for chills, fatigue and fever.  HENT: Positive for congestion, ear pain, postnasal drip, rhinorrhea, sinus pressure and sore throat. Negative for nosebleeds.   Eyes: Negative for pain, redness and itching.  Respiratory: Positive for cough. Negative for chest tightness, shortness of breath, wheezing and stridor.   Cardiovascular: Negative for chest pain.  Gastrointestinal: Negative for abdominal pain, diarrhea, nausea and vomiting.  Endocrine: Negative for polyuria.  Genitourinary: Negative for dysuria, frequency and urgency.  Musculoskeletal: Negative for arthralgias and myalgias.  Allergic/Immunologic: Negative for immunocompromised state.  Neurological: Positive for headaches. Negative for dizziness, tremors, syncope, weakness and numbness.  Hematological: Negative for adenopathy. Does not bruise/bleed easily.  Psychiatric/Behavioral: Negative for dysphoric mood. The patient is not nervous/anxious.        Objective:   Physical Exam  Constitutional: She appears well-developed and well-nourished. No distress.  overwt and well app  HENT:  Head: Normocephalic and atraumatic.  Right Ear: External ear normal.  Left Ear: External ear normal.  Mouth/Throat: Oropharynx is clear and moist. No oropharyngeal exudate.  Nares are injected and congested  Bilateral frontal  sinus tenderness    Post nasal drip   TMs are clear with nl light reflex  Eyes: Pupils are equal, round, and reactive to light. Conjunctivae and EOM are normal. Right eye exhibits no discharge. Left eye exhibits no discharge.  Neck: Normal range of motion. Neck supple.  Cardiovascular: Normal rate and regular rhythm.  Pulmonary/Chest: Effort normal and breath sounds normal. No stridor. No respiratory distress. She has no wheezes. She has no rales.  Good air exch No rales or rhonchi or wheeze   Lymphadenopathy:    She has no cervical adenopathy.  Neurological: She is alert. No cranial nerve deficit.  Skin: Skin is warm and dry. No rash noted.  Psychiatric: She has a normal mood and affect.          Assessment & Plan:   Problem List Items Addressed This Visit      Respiratory   Acute sinusitis - Primary    S/p congestion from uri /allergies  Recommend re start flonase augmentin px  Also tessalon for cough  Expectorant prn otc  Update if not starting to improve in a week or if worsening         Relevant Medications   amoxicillin-clavulanate (AUGMENTIN) 875-125 MG tablet   benzonatate (TESSALON) 200 MG capsule

## 2018-03-14 ENCOUNTER — Ambulatory Visit: Payer: Managed Care, Other (non HMO) | Admitting: Gynecology

## 2018-03-14 ENCOUNTER — Encounter: Payer: Self-pay | Admitting: Gynecology

## 2018-03-14 VITALS — BP 122/78

## 2018-03-14 DIAGNOSIS — D259 Leiomyoma of uterus, unspecified: Secondary | ICD-10-CM

## 2018-03-14 NOTE — Progress Notes (Signed)
    Peggy Hicks 18-Aug-1965 [id number]        52 y.o.  G2P2002 presents feeling some discomfort in her left lower abdomen and being able to feel a firmness.  No urinary symptoms such as frequency dysuria urgency diarrhea constipation.  Past medical history,surgical history, problem list, medications, allergies, family history and social history were all reviewed and documented in the EPIC chart.  Directed ROS with pertinent positives and negatives documented in the history of present illness/assessment and plan.  Exam: [name] assistant Vitals:   03/14/18 1516  BP: 122/78   General appearance:  Normal Abdomen soft nontender with palpable mass extending from the pelvis to several fingerbreadths below the umbilicus consistent with her history of leiomyoma. Pelvic external BUS vagina normal.  Cervix normal.  Uterus enlarged at 16 plus weeks size consistent with leiomyoma.  Adnexa unable to evaluate.  Assessment/Plan:  52 y.o. [id number] with palpable enlarged uterus.  Feels larger than palpated previously.  Continues to have monthly menses without menopausal symptoms.  Recommend starting with ultrasound to rule out ovarian disease and for overall uterine measurement.  Discussed with patient now she is becoming more symptomatic as far as being able to feel her leiomyoma whether it is time to proceed with hysterectomy which would include TAH probable BSO.  She does have a history of open myomectomy preceding her 2 pregnancies, 2 cesarean sections and one subsequent hysteroscopic submucous myomectomy.  Patient is going to think about whether she wants to proceed with hysterectomy given her symptoms and will follow-up for the ultrasound.    Anastasio Auerbach MD, 3:29 PM 03/14/2018

## 2018-03-14 NOTE — Patient Instructions (Signed)
Follow up for ultrasound as scheduled 

## 2018-04-03 DIAGNOSIS — I38 Endocarditis, valve unspecified: Secondary | ICD-10-CM

## 2018-04-03 HISTORY — DX: Endocarditis, valve unspecified: I38

## 2018-04-18 ENCOUNTER — Ambulatory Visit: Payer: Managed Care, Other (non HMO) | Admitting: Gynecology

## 2018-04-18 ENCOUNTER — Encounter: Payer: Self-pay | Admitting: Gynecology

## 2018-04-18 ENCOUNTER — Ambulatory Visit (INDEPENDENT_AMBULATORY_CARE_PROVIDER_SITE_OTHER): Payer: Managed Care, Other (non HMO)

## 2018-04-18 ENCOUNTER — Other Ambulatory Visit: Payer: Self-pay | Admitting: Gynecology

## 2018-04-18 VITALS — BP 124/80

## 2018-04-18 DIAGNOSIS — D251 Intramural leiomyoma of uterus: Secondary | ICD-10-CM | POA: Diagnosis not present

## 2018-04-18 DIAGNOSIS — D259 Leiomyoma of uterus, unspecified: Secondary | ICD-10-CM | POA: Diagnosis not present

## 2018-04-18 DIAGNOSIS — D252 Subserosal leiomyoma of uterus: Secondary | ICD-10-CM

## 2018-04-18 NOTE — Patient Instructions (Signed)
Follow-up with your decision as far as surgery.

## 2018-04-18 NOTE — Progress Notes (Signed)
    Peggy Hicks 10/14/1965 [id number]        52 y.o.  [id number] presents for follow-up ultrasound.  Long history of leiomyoma.  Was having some lower abdominal discomfort and recent exam showed uterus enlarged at approximately 16 weeks size with questionable growth from the previous exam.  Ultrasound was ordered to rule out ovarian pathology.  Past medical history,surgical history, problem list, medications, allergies, family history and social history were all reviewed and documented in the EPIC chart.  Directed ROS with pertinent positives and negatives documented in the history of present illness/assessment and plan.  Exam: Vitals:   04/18/18 1215  BP: 124/80   General appearance:  Normal  Ultrasound is abdominal shows uterus enlarged with multiple myomas numbering 15-20.  Largest measuring 74 x 51 mm.  Endometrial echo 5.8 mm although measurement distorted by the myomas.  Right and left ovaries normal.  Cul-de-sac negative  Assessment/Plan:  53 y.o. [id number] with multiple myomas.  Ovaries appear normal.  Long history of enlarged uterus with myomas.  Does appear that her uterus has enlarged since the last time we looked with the ultrasound.  She understands no guarantees as far as malignancy but most likely this all represents benign leiomyomas.  Status post multiple myomectomy in the past with 2 subsequent pregnancies and cesarean sections.  Also has had one hysteroscopic myomectomy.  We again discussed options to include hormonal manipulation such as Depo-Lupron, open myomectomy, TAH with or without BSO.  She continues to have menses now.  The issues of BSO discussed to include ongoing hormonal production for a transition through menopause versus long-term risks of ovarian disease to include ovarian cancer in the future.  Possibilities for ERT discussed and the risks versus benefits to include increased risk of thrombosis in the breast cancer issue were all reviewed.  Patient wants to think of  her options and will follow-up with her decision.    Anastasio Auerbach MD, 12:30 PM 04/18/2018

## 2018-05-02 ENCOUNTER — Telehealth: Payer: Self-pay | Admitting: Family Medicine

## 2018-05-02 ENCOUNTER — Ambulatory Visit: Payer: Managed Care, Other (non HMO) | Admitting: Gynecology

## 2018-05-02 DIAGNOSIS — R7303 Prediabetes: Secondary | ICD-10-CM

## 2018-05-02 DIAGNOSIS — Z0000 Encounter for general adult medical examination without abnormal findings: Secondary | ICD-10-CM

## 2018-05-02 NOTE — Telephone Encounter (Signed)
-----   Message from Ellamae Sia sent at 04/22/2018  9:11 AM EST ----- Regarding: Lab orders for Friday, 1.31.20 Patient is scheduled for CPX labs, please order future labs, Thanks , Nori Riis

## 2018-05-03 ENCOUNTER — Encounter: Payer: Managed Care, Other (non HMO) | Admitting: Family Medicine

## 2018-05-03 ENCOUNTER — Other Ambulatory Visit (INDEPENDENT_AMBULATORY_CARE_PROVIDER_SITE_OTHER): Payer: Managed Care, Other (non HMO)

## 2018-05-03 DIAGNOSIS — Z0000 Encounter for general adult medical examination without abnormal findings: Secondary | ICD-10-CM | POA: Diagnosis not present

## 2018-05-03 DIAGNOSIS — R7303 Prediabetes: Secondary | ICD-10-CM

## 2018-05-03 NOTE — Addendum Note (Signed)
Addended by: Ellamae Sia on: 05/03/2018 08:27 AM   Modules accepted: Orders

## 2018-05-03 NOTE — Addendum Note (Signed)
Addended by: Ellamae Sia on: 05/03/2018 08:26 AM   Modules accepted: Orders

## 2018-05-04 LAB — COMPREHENSIVE METABOLIC PANEL
ALT: 10 IU/L (ref 0–32)
AST: 15 IU/L (ref 0–40)
Albumin/Globulin Ratio: 1.8 (ref 1.2–2.2)
Albumin: 4.1 g/dL (ref 3.8–4.9)
Alkaline Phosphatase: 71 IU/L (ref 39–117)
BUN/Creatinine Ratio: 15 (ref 9–23)
BUN: 13 mg/dL (ref 6–24)
Bilirubin Total: 0.5 mg/dL (ref 0.0–1.2)
CO2: 24 mmol/L (ref 20–29)
Calcium: 9.5 mg/dL (ref 8.7–10.2)
Chloride: 106 mmol/L (ref 96–106)
Creatinine, Ser: 0.85 mg/dL (ref 0.57–1.00)
GFR calc Af Amer: 90 mL/min/{1.73_m2} (ref >59)
GFR calc non Af Amer: 78 mL/min/{1.73_m2} (ref >59)
Globulin, Total: 2.3 g/dL (ref 1.5–4.5)
Glucose: 91 mg/dL (ref 65–99)
Potassium: 5.1 mmol/L (ref 3.5–5.2)
Sodium: 144 mmol/L (ref 134–144)
Total Protein: 6.4 g/dL (ref 6.0–8.5)

## 2018-05-04 LAB — HEMOGLOBIN A1C
Est. average glucose Bld gHb Est-mCnc: 120 mg/dL
Hgb A1c MFr Bld: 5.8 % — ABNORMAL HIGH (ref 4.8–5.6)

## 2018-05-04 LAB — LIPID PANEL
Chol/HDL Ratio: 2.5 ratio (ref 0.0–4.4)
Cholesterol, Total: 208 mg/dL — ABNORMAL HIGH (ref 100–199)
HDL: 84 mg/dL (ref >39)
LDL Calculated: 106 mg/dL — ABNORMAL HIGH (ref 0–99)
Triglycerides: 89 mg/dL (ref 0–149)
VLDL Cholesterol Cal: 18 mg/dL (ref 5–40)

## 2018-05-04 LAB — CBC WITH DIFFERENTIAL/PLATELET
Basophils Absolute: 0 10*3/uL (ref 0.0–0.2)
Basos: 1 %
EOS (ABSOLUTE): 0.1 10*3/uL (ref 0.0–0.4)
Eos: 1 %
Hematocrit: 38.3 % (ref 34.0–46.6)
Hemoglobin: 12.7 g/dL (ref 11.1–15.9)
Immature Grans (Abs): 0 10*3/uL (ref 0.0–0.1)
Immature Granulocytes: 0 %
Lymphocytes Absolute: 3 10*3/uL (ref 0.7–3.1)
Lymphs: 52 %
MCH: 28.3 pg (ref 26.6–33.0)
MCHC: 33.2 g/dL (ref 31.5–35.7)
MCV: 85 fL (ref 79–97)
Monocytes Absolute: 0.3 10*3/uL (ref 0.1–0.9)
Monocytes: 5 %
Neutrophils Absolute: 2.3 10*3/uL (ref 1.4–7.0)
Neutrophils: 41 %
Platelets: 310 10*3/uL (ref 150–450)
RBC: 4.49 x10E6/uL (ref 3.77–5.28)
RDW: 14.3 % (ref 11.7–15.4)
WBC: 5.7 10*3/uL (ref 3.4–10.8)

## 2018-05-04 LAB — TSH: TSH: 2.91 u[IU]/mL (ref 0.450–4.500)

## 2018-05-07 ENCOUNTER — Ambulatory Visit (INDEPENDENT_AMBULATORY_CARE_PROVIDER_SITE_OTHER): Payer: Managed Care, Other (non HMO) | Admitting: Family Medicine

## 2018-05-07 ENCOUNTER — Encounter: Payer: Self-pay | Admitting: Family Medicine

## 2018-05-07 VITALS — BP 136/80 | HR 61 | Temp 98.2°F | Ht 65.5 in | Wt 178.2 lb

## 2018-05-07 DIAGNOSIS — Z0000 Encounter for general adult medical examination without abnormal findings: Secondary | ICD-10-CM | POA: Diagnosis not present

## 2018-05-07 DIAGNOSIS — R7303 Prediabetes: Secondary | ICD-10-CM | POA: Diagnosis not present

## 2018-05-07 NOTE — Progress Notes (Signed)
Subjective:    Patient ID: Peggy Hicks, female    DOB: 01-05-1966, 53 y.o.   MRN: [id number]  HPI  Here for health maintenance exam and to review chronic medical problems    Wt Readings from Last 3 Encounters:  05/07/18 178 lb 4 oz (80.9 kg)  03/05/18 187 lb 8 oz (85 kg)  02/04/18 181 lb (82.1 kg)  wt is down - really working on it  [employer]  Also eating well  29.21 kg/m    Pap 2/15-- ? When the last one was  ? Last pap = Dr Denman George  Will be planning a hysterectomy - for large fibroid  Cycles are still regular   Mammogram 7/19 Self breast exam -no breast lumps  Colonoscopy 1/16 with 5 y recall   Tetanus shot 1/18  Flu shot 12/19  Zoster status - has not had shingles    Prediabetes Lab Results  Component Value Date   HGBA1C 5.8 (H) 05/03/2018  doing well  Down from 5.9  Watching carbs and exercising   Cholesterol Lab Results  Component Value Date   CHOL 208 (H) 05/03/2018   CHOL 239 (H) 04/27/2017   CHOL 233 (H) 04/18/2016   Lab Results  Component Value Date   HDL 84 05/03/2018   HDL 95 04/27/2017   HDL 89 04/18/2016   Lab Results  Component Value Date   LDLCALC 106 (H) 05/03/2018   LDLCALC 120 (H) 04/27/2017   LDLCALC 129 (H) 04/18/2016   Lab Results  Component Value Date   TRIG 89 05/03/2018   TRIG 120 04/27/2017   TRIG 75 04/18/2016   Lab Results  Component Value Date   CHOLHDL 2.5 05/03/2018   CHOLHDL 2.5 04/27/2017   CHOLHDL 2.6 04/18/2016   No results found for: LDLDIRECT Better diet and exercise  LDL is improved   Lab Results  Component Value Date   WBC 5.7 05/03/2018   HGB 12.7 05/03/2018   HCT 38.3 05/03/2018   MCV 85 05/03/2018   PLT 310 05/03/2018      Chemistry      Component Value Date/Time   NA 144 05/03/2018 0837   K 5.1 05/03/2018 0837   CL 106 05/03/2018 0837   CO2 24 05/03/2018 0837   BUN 13 05/03/2018 0837   CREATININE 0.85 05/03/2018 0837   CREATININE 0.81 05/21/2012 1432      Component  Value Date/Time   CALCIUM 9.5 05/03/2018 0837   ALKPHOS 71 05/03/2018 0837   AST 15 05/03/2018 0837   ALT 10 05/03/2018 0837   BILITOT 0.5 05/03/2018 0837      Lab Results  Component Value Date   TSH 2.910 05/03/2018     Patient Active Problem List   Diagnosis Date Noted  . Chest wall discomfort 07/10/2017  . Daily headache 06/29/2017  . Hx of adenomatous colonic polyps 04/14/2014  . Left lumbar radiculopathy 12/10/2012  . Routine general medical examination at a health care facility 01/01/2012  . Stress reaction 11/13/2011  . Prediabetes 11/15/2010  . PRIMARY FOCAL HYPERHIDROSIS 10/22/2007  . Situational anxiety 08/01/2006   Past Medical History:  Diagnosis Date  . Fibroids   . Hx of adenomatous colonic polyps 04/14/2014  . Hyperhidrosis   . Stress reaction    Past Surgical History:  Procedure Laterality Date  . BREAST BIOPSY    . BREAST SURGERY     Breast Reduction  . CESAREAN SECTION      X 2  .  HYSTEROSCOPY    . MYOMECTOMY     Uterine fibroids removed/ hysteroscopic submucous myomectomy  . TUBAL LIGATION     Social History   Tobacco Use  . Smoking status: Never Smoker  . Smokeless tobacco: Never Used  Substance Use Topics  . Alcohol use: Yes    Alcohol/week: 0.0 standard drinks    Comment: Rare  . Drug use: No   Family History  Problem Relation Age of Onset  . Hypertension Mother   . Heart disease Mother   . Diabetes Mother   . Hypertension Father   . Heart disease Father   . Cancer Maternal Aunt        liver  . Colon cancer Neg Hx   . Esophageal cancer Neg Hx   . Rectal cancer Neg Hx   . Stomach cancer Neg Hx    No Known Allergies Current Outpatient Medications on File Prior to Visit  Medication Sig Dispense Refill  . ALPRAZolam (XANAX) 0.5 MG tablet Take 1 tablet (0.5 mg total) by mouth daily as needed for anxiety. For traveling 30 tablet 0  . Multiple Vitamins-Minerals (MULTIVITAMIN PO) Take 1 tablet by mouth daily. Women's multivitamin  daily     No current facility-administered medications on file prior to visit.      Review of Systems  Constitutional: Negative for activity change, appetite change, fatigue, fever and unexpected weight change.  HENT: Negative for congestion, ear pain, rhinorrhea, sinus pressure and sore throat.   Eyes: Negative for pain, redness and visual disturbance.  Respiratory: Negative for cough, shortness of breath and wheezing.   Cardiovascular: Negative for chest pain and palpitations.  Gastrointestinal: Negative for abdominal pain, blood in stool, constipation and diarrhea.  Endocrine: Negative for polydipsia and polyuria.  Genitourinary: Positive for menstrual problem and pelvic pain. Negative for dysuria, frequency and urgency.       Fibroids   Musculoskeletal: Negative for arthralgias, back pain and myalgias.  Skin: Negative for pallor and rash.  Allergic/Immunologic: Negative for environmental allergies.  Neurological: Negative for dizziness, syncope and headaches.  Hematological: Negative for adenopathy. Does not bruise/bleed easily.  Psychiatric/Behavioral: Negative for decreased concentration and dysphoric mood. The patient is not nervous/anxious.        Objective:   Physical Exam Constitutional:      General: She is not in acute distress.    Appearance: Normal appearance. She is well-developed and normal weight.  HENT:     Head: Normocephalic and atraumatic.     Right Ear: Tympanic membrane, ear canal and external ear normal.     Left Ear: Tympanic membrane, ear canal and external ear normal.     Nose: Nose normal.     Mouth/Throat:     Mouth: Mucous membranes are moist.     Pharynx: Oropharynx is clear.  Eyes:     General: No scleral icterus.       Right eye: No discharge.        Left eye: No discharge.     Conjunctiva/sclera: Conjunctivae normal.     Pupils: Pupils are equal, round, and reactive to light.  Neck:     Musculoskeletal: Normal range of motion and neck  supple.     Thyroid: No thyromegaly.     Vascular: No carotid bruit or JVD.  Cardiovascular:     Rate and Rhythm: Normal rate and regular rhythm.     Heart sounds: Normal heart sounds. No gallop.   Pulmonary:     Effort: Pulmonary effort is  normal. No respiratory distress.     Breath sounds: Normal breath sounds. No wheezing or rales.  Abdominal:     General: Bowel sounds are normal. There is no distension.     Palpations: Abdomen is soft. There is no mass.     Tenderness: There is no abdominal tenderness.  Genitourinary:    Comments: Breast exam: No mass, nodules, thickening, tenderness, bulging, retraction, inflamation, nipple discharge or skin changes noted.  No axillary or clavicular LA.     Musculoskeletal:        General: No tenderness.  Lymphadenopathy:     Cervical: No cervical adenopathy.  Skin:    General: Skin is warm and dry.     Coloration: Skin is not pale.     Findings: No erythema or rash.     Comments: Few brown nevi  Some pigment changes on face  Neurological:     General: No focal deficit present.     Mental Status: She is alert.     Cranial Nerves: No cranial nerve deficit.     Motor: No abnormal muscle tone.     Coordination: Coordination normal.     Deep Tendon Reflexes: Reflexes are normal and symmetric.  Psychiatric:        Mood and Affect: Mood normal.           Assessment & Plan:   Problem List Items Addressed This Visit      Other   Prediabetes    Lab Results  Component Value Date   HGBA1C 5.8 (H) 05/03/2018   Commended hard work  disc imp of low glycemic diet and wt loss to prevent DM2       Routine general medical examination at a health care facility - Primary    Reviewed health habits including diet and exercise and skin cancer prevention Reviewed appropriate screening tests for age  Also reviewed health mt list, fam hx and immunization status , as well as social and family history   Enc her to keep up the good work with diet  and exercise  F/u planned with Dr Phineas Real for her annual gyn exam and also to disc hysterectomy for large fibroid  She is peri menopausal  Due for 5 y recall colonoscopy 1/21 Labs are stable- good cholesterol profile

## 2018-05-07 NOTE — Assessment & Plan Note (Signed)
Reviewed health habits including diet and exercise and skin cancer prevention Reviewed appropriate screening tests for age  Also reviewed health mt list, fam hx and immunization status , as well as social and family history   Enc her to keep up the good work with diet and exercise  F/u planned with Dr Phineas Real for her annual gyn exam and also to disc hysterectomy for large fibroid  She is peri menopausal  Due for 5 y recall colonoscopy 1/21 Labs are stable- good cholesterol profile

## 2018-05-07 NOTE — Patient Instructions (Addendum)
If you are interested in a shingles vaccine - (shingrix) - check with your insurance co to see if covered  If so you can get on a wait list here or at a pharmacy   Keep up the good work with diet and exercise  Follow up with gyn as planned   Stop at check out to sign a release to get pap report from Dr Phineas Real

## 2018-05-07 NOTE — Assessment & Plan Note (Signed)
Lab Results  Component Value Date   HGBA1C 5.8 (H) 05/03/2018   Commended hard work  disc imp of low glycemic diet and wt loss to prevent DM2

## 2018-05-08 ENCOUNTER — Ambulatory Visit: Payer: Managed Care, Other (non HMO) | Admitting: Gynecology

## 2018-05-08 ENCOUNTER — Encounter: Payer: Self-pay | Admitting: Gynecology

## 2018-05-08 VITALS — BP 124/80

## 2018-05-08 DIAGNOSIS — D259 Leiomyoma of uterus, unspecified: Secondary | ICD-10-CM | POA: Diagnosis not present

## 2018-05-08 DIAGNOSIS — R102 Pelvic and perineal pain: Secondary | ICD-10-CM | POA: Diagnosis not present

## 2018-05-08 NOTE — Patient Instructions (Signed)
Office will call you to arrange for surgery. 

## 2018-05-08 NOTE — Progress Notes (Signed)
    Peggy Hicks Sep 12, 1965 [id number]        52 y.o.  [id number] presents to discuss her leiomyoma.  She has a long history of leiomyoma which now have increased to approximately 16 weeks size.  She is able to feel them transabdominally and starting to have more pelvic pressure and low back pain.  Also continues to have her menses.  We recently discussed all of her options as evidenced in my 04/18/2018 note and she is here to formulate a plan.  Past medical history,surgical history, problem list, medications, allergies, family history and social history were all reviewed and documented in the EPIC chart.  Directed ROS with pertinent positives and negatives documented in the history of present illness/assessment and plan.  Exam: Vitals:   05/08/18 1417  BP: 124/80   General appearance:  Normal   Assessment/Plan:  53 y.o. [id number] with a long history of leiomyoma.  Has undergone open myomectomy as well as hysteroscopic myomectomy.  Also 2 cesarean sections.  At this point the patient has decided she wants to proceed with hysterectomy.  We discussed various approaches to include robotic, laparoscopic and open TAH.  The patient would prefer open TAH given her prior surgeries, potential for adhesions and wanting to avoid the multiple port sites.  We rediscussed ovarian conservation particularly at age 14 and the options to keep both ovaries for transition through menopause accepting the risk of ovarian disease in the future including cancer versus removing both ovaries now and having the possibility of menopausal symptoms and possible HRT.  We discussed with involved with HRT to include possible increased risks of thrombosis such as stroke heart attack DVT in the breast cancer issue.  At this point the patient's wanting to keep both ovaries understanding and accepting the risk of ovarian disease in the future but she still wants to think about this and may change her mind preoperatively.  We will go ahead  and move towards scheduling her surgery.  I discussed in general was involved with the surgery to include the larger incision, recovery time, risks particularly with her prior surgeries of inadvertent injury to internal organs including bowel bladder ureters vessels and nerves as well as the risks of hemorrhage transfusion and infection.  Keloid formation with cosmetic concerns in the future also discussed.  The patient wants to go ahead and schedule her surgery and will re-present for a preoperative appointment.  I spent a total of 15 face-to-face minutes with the patient, over 50% was spent counseling and coordination of care.     Anastasio Auerbach MD, 3:44 PM 05/08/2018

## 2018-05-09 ENCOUNTER — Telehealth: Payer: Self-pay

## 2018-05-09 NOTE — Telephone Encounter (Signed)
I spoke with patient and reviewed her ins benefits and her estimated surgery prepymt due by one week before surgery.  I asked her about her ovaries but she said she is still deciding and she said Dr. Loetta Rough told her she could wait until the last minute to tell him.  We discussed available dates and I scheduled her for 06/04/18 at 7:30am at [facility].  I will mail her a financial letter and a pamphlet from [facility].  She has CE scheduled 05/31/18 and wants to see if Dr. Loetta Rough can do pre op at that visit. I will check with him and let her know.

## 2018-05-14 ENCOUNTER — Encounter: Payer: Self-pay | Admitting: Gynecology

## 2018-05-14 ENCOUNTER — Telehealth: Payer: Self-pay

## 2018-05-14 DIAGNOSIS — Z0289 Encounter for other administrative examinations: Secondary | ICD-10-CM

## 2018-05-14 NOTE — Telephone Encounter (Signed)
Fibroids do shrink with menopause although they do not go away.  The degree of how much they shrink varies.  Depo-Lupron is a medication that will accelerate menopausal changes and help to shrink myomas.  If she really wants to avoid hysterectomy at all cost then we can postpone surgery and see what she does over the next 6 months from a menstrual cycle standpoint and from her symptoms.  No guarantees her symptoms will resolve but she could get enough shrinkage.

## 2018-05-14 NOTE — Telephone Encounter (Signed)
Patient called back and read her Dr. Dorette Grate note/recommendations.  She wants to think about it and call me back.

## 2018-05-14 NOTE — Telephone Encounter (Signed)
Left message to call me.

## 2018-05-14 NOTE — Telephone Encounter (Signed)
Patient called to make sure I received disability forms that were sent yesterday. I called to let her know that I did get them.  Patient had questions.  #1  She did not have a period in Jan and so far nothing in Feb. She questions if she is in menopause what is the possibility of the fibroid shrinking on it's own?  #2  Is there anything to do or take to shrink the fibroid to not have to have surgery?

## 2018-05-14 NOTE — Telephone Encounter (Signed)
Patient called me back and left message in voice mail and said she did want to proceed with surgery and thinks she wants to keep her ovaries.

## 2018-05-21 ENCOUNTER — Encounter: Payer: Self-pay | Admitting: Gynecology

## 2018-05-27 NOTE — Patient Instructions (Addendum)
Peggy Hicks  05/27/2018      Your procedure is scheduled on 06-04-2018  Report to [place]  at  5:30A.M.  Call this number if you have problems the morning of surgery:[phone]  OUR ADDRESS IS [place], WE ARE LOCATED IN THE [facility] WITH [facility].   Remember:  NO SOLID FOOD AFTER MIDNIGHT THE NIGHT PRIOR TO SURGERY. NOTHING BY MOUTH EXCEPT CLEAR LIQUIDS UNTIL 3 HOURS PRIOR TO [place] SURGERY. PLEASE FINISH ENSURE DRINK PER SURGEON ORDER 3 HOURS PRIOR TO SCHEDULED SURGERY TIME WHICH NEEDS TO BE COMPLETED AT ____4:30AM________.      CLEAR LIQUID DIET   Foods Allowed                                                                     Foods Excluded  Coffee and tea, regular and decaf                             liquids that you cannot  Plain Jell-O in any flavor                                             see through such as: Fruit ices (not with fruit pulp)                                     milk, soups, orange juice  Iced Popsicles                                    All solid food Carbonated beverages, regular and diet                                    Cranberry, grape and apple juices Sports drinks like Gatorade Lightly seasoned clear broth or consume(fat free) Sugar, honey syrup  Sample Menu Breakfast                                Lunch                                     Supper Cranberry juice                    Beef broth                            Chicken broth Jell-O                                     Grape juice  Apple juice Coffee or tea                        Jell-O                                      Popsicle                                                Coffee or tea                        Coffee or tea  _____________________________________________________________________      Take these medicines the morning of surgery with A SIP OF WATER: xanax if needed, zyrtec if needed, flonase  if needed   Do not wear jewelry, make-up or nail polish.  Do not wear lotions, powders, or perfumes, or deoderant.  Do not shave 48 hours prior to surgery.  Men may shave face and neck.  Do not bring valuables to the hospital.  [facility] is not responsible for any belongings or valuables.  Contacts, dentures or bridgework may not be worn into surgery.  Leave your suitcase in the car.  After surgery it may be brought to your room.  For patients admitted to the hospital, discharge time will be determined by your treatment team.  Patients discharged the day of surgery will not be allowed to drive home.   Special instructions:  N/a   Please read over the following fact sheets that you were given:       [facility] - Preparing for Surgery Before surgery, you can play an important role.  Because skin is not sterile, your skin needs to be as free of germs as possible.  You can reduce the number of germs on your skin by washing with CHG (chlorahexidine gluconate) soap before surgery.  CHG is an antiseptic cleaner which kills germs and bonds with the skin to continue killing germs even after washing. Please DO NOT use if you have an allergy to CHG or antibacterial soaps.  If your skin becomes reddened/irritated stop using the CHG and inform your nurse when you arrive at Short Stay. Do not shave (including legs and underarms) for at least 48 hours prior to the first CHG shower.  You may shave your face/neck. Please follow these instructions carefully:  1.  Shower with CHG Soap the night before surgery and the  morning of Surgery.  2.  If you choose to wash your hair, wash your hair first as usual with your  normal  shampoo.  3.  After you shampoo, rinse your hair and body thoroughly to remove the  shampoo.                           4.  Use CHG as you would any other liquid soap.  You can apply chg directly  to the skin and wash                       Gently with a scrungie or clean  washcloth.  5.  Apply the CHG Soap to your body ONLY FROM THE NECK DOWN.   Do not use  on face/ open                           Wound or open sores. Avoid contact with eyes, ears mouth and genitals (private parts).                       Wash face,  Genitals (private parts) with your normal soap.             6.  Wash thoroughly, paying special attention to the area where your surgery  will be performed.  7.  Thoroughly rinse your body with warm water from the neck down.  8.  DO NOT shower/wash with your normal soap after using and rinsing off  the CHG Soap.                9.  Pat yourself dry with a clean towel.            10.  Wear clean pajamas.            11.  Place clean sheets on your bed the night of your first shower and do not  sleep with pets. Day of Surgery : Do not apply any lotions/deodorants the morning of surgery.  Please wear clean clothes to the hospital/surgery center.  FAILURE TO FOLLOW THESE INSTRUCTIONS MAY RESULT IN THE CANCELLATION OF YOUR SURGERY PATIENT SIGNATURE_________________________________  NURSE SIGNATURE__________________________________  ________________________________________________________________________

## 2018-05-28 ENCOUNTER — Encounter: Payer: Self-pay | Admitting: Gynecology

## 2018-05-29 ENCOUNTER — Other Ambulatory Visit: Payer: Self-pay

## 2018-05-29 ENCOUNTER — Encounter (HOSPITAL_COMMUNITY): Payer: Self-pay

## 2018-05-29 ENCOUNTER — Encounter (HOSPITAL_COMMUNITY)
Admission: RE | Admit: 2018-05-29 | Discharge: 2018-05-29 | Disposition: A | Discharge status: Home / Self Care | Payer: Managed Care, Other (non HMO) | Source: Ambulatory Visit | Attending: Gynecology | Admitting: Gynecology

## 2018-05-29 DIAGNOSIS — Z01812 Encounter for preprocedural laboratory examination: Secondary | ICD-10-CM | POA: Diagnosis not present

## 2018-05-29 LAB — COMPREHENSIVE METABOLIC PANEL
ALT: 16 U/L (ref 0–44)
AST: 19 U/L (ref 15–41)
Albumin: 4.2 g/dL (ref 3.5–5.0)
Alkaline Phosphatase: 65 U/L (ref 38–126)
Anion gap: 7 (ref 5–15)
BUN: 15 mg/dL (ref 6–20)
CO2: 28 mmol/L (ref 22–32)
Calcium: 9.3 mg/dL (ref 8.9–10.3)
Chloride: 104 mmol/L (ref 98–111)
Creatinine, Ser: 0.8 mg/dL (ref 0.44–1.00)
GFR calc Af Amer: >60 mL/min (ref >60)
GFR calc non Af Amer: >60 mL/min (ref >60)
Glucose, Bld: 101 mg/dL — ABNORMAL HIGH (ref 70–99)
Potassium: 3.9 mmol/L (ref 3.5–5.1)
Sodium: 139 mmol/L (ref 135–145)
Total Bilirubin: 1 mg/dL (ref 0.3–1.2)
Total Protein: 7.6 g/dL (ref 6.5–8.1)

## 2018-05-29 LAB — CBC
HCT: 45.2 % (ref 36.0–46.0)
Hemoglobin: 13.8 g/dL (ref 12.0–15.0)
MCH: 27.7 pg (ref 26.0–34.0)
MCHC: 30.5 g/dL (ref 30.0–36.0)
MCV: 90.8 fL (ref 80.0–100.0)
Platelets: 286 10*3/uL (ref 150–400)
RBC: 4.98 MIL/uL (ref 3.87–5.11)
RDW: 14.4 % (ref 11.5–15.5)
WBC: 7.1 10*3/uL (ref 4.0–10.5)
nRBC: 0 % (ref 0.0–0.2)

## 2018-05-31 ENCOUNTER — Ambulatory Visit: Payer: Managed Care, Other (non HMO) | Admitting: Gynecology

## 2018-05-31 ENCOUNTER — Encounter: Payer: Self-pay | Admitting: Gynecology

## 2018-05-31 VITALS — BP 124/74 | Ht 66.0 in | Wt 178.0 lb

## 2018-05-31 DIAGNOSIS — Z01419 Encounter for gynecological examination (general) (routine) without abnormal findings: Secondary | ICD-10-CM | POA: Diagnosis not present

## 2018-05-31 DIAGNOSIS — D252 Subserosal leiomyoma of uterus: Secondary | ICD-10-CM | POA: Diagnosis not present

## 2018-05-31 DIAGNOSIS — D251 Intramural leiomyoma of uterus: Secondary | ICD-10-CM | POA: Diagnosis not present

## 2018-05-31 NOTE — Patient Instructions (Signed)
Followup for surgery as scheduled. 

## 2018-05-31 NOTE — Progress Notes (Signed)
Peggy Hicks 02-16-1966 [id number]        53 y.o.  [id number] for annual gynecologic exam.  Also for her preop consult for her upcoming hysterectomy.  Past medical history,surgical history, problem list, medications, allergies, family history and social history were all reviewed and documented as reviewed in the EPIC chart.  ROS:  Performed with pertinent positives and negatives included in the history, assessment and plan.   Additional significant findings : None   Exam: [name] assistant Vitals:   05/31/18 1020  BP: 124/74  Weight: 178 lb (80.7 kg)  Height: 5\' 6"  (1.676 m)   Body mass index is 28.73 kg/m.  General appearance:  Normal affect, orientation and appearance. Skin: Grossly normal HEENT: Without gross lesions.  No cervical or supraclavicular adenopathy. Thyroid normal.  Lungs:  Clear without wheezing, rales or rhonchi Cardiac: RR, without RMG Abdominal:  Soft, nontender, with palpable uterus above the symphysis.  No other masses guarding rebound or hernia. Breasts:  Examined lying and sitting without masses, retractions, discharge or axillary adenopathy. Pelvic:  Ext, BUS, Vagina: Normal  Cervix: Normal  Uterus: Bulky 16 weeks size   Adnexa: Unable to evaluate due to bulk of the uterus  Anus and perineum: Normal   Rectovaginal: Normal sphincter tone without palpated masses or tenderness.    Assessment/Plan:  53 y.o. G32P2002 female for annual gynecologic exam.  1. Perimenopausal.  Skipped January's menses.  Not having significant hot flashes or sweats.  Options for management reviewed to include HRT.  Will readdress after hysterectomy as needed. 2. Pap smear/HPV 2015.  No Pap smear done today as we are proceeding with hysterectomy next week and she has no history of abnormal Pap smears previously. 3. Mammography 10/2017.  Continue with annual mammography when due.  Breast exam normal today. 4. Colonoscopy 2016.  Repeat at their recommended  interval. 5. Health maintenance.  Routine blood work done through her primary physician's office. Leiomyoma.  Long history of leiomyoma status post open myomectomy as well as hysteroscopic myomectomy.  Now able to palpate her uterus above the symphysis and is now causing her pressure and discomfort symptoms.  Has continue with monthly menses although skipped January and awaiting February.  Not having significant hot flashes or sweats.  We have discussed the options for management and she wants to proceed with hysterectomy.  Approaches to include attempted robotic versus open discussed and she wants to proceed with open hysterectomy.  We discussed again the ovarian conservation issue and she would like to keep both ovaries for a more gradual transition through menopause.  We discussed the risks of keeping her ovaries to include ovarian disease in the future including ovarian cancer.  She understands that she is transitioning into menopause now and that her ovaries may not continue to function hormonally for much longer and that she may face HRT regardless.  Risks of HRT to include thrombosis issue and breast cancer also discussed.  At this point the patient strongly wants to keep both ovaries but does give me permission to remove one or both if it is my intraoperative best decision if they appear diseased or technically require it.  We will plan on bilateral salpingectomies for risk reductive surgery.  She understands hysterectomy is absolute irreversible sterility.  Sexuality following hysterectomy also discussed and the potential for persistent orgasmic dysfunction as well as persistent dyspareunia was reviewed.  The expected intraoperative and postoperative courses as well as the recovery period were reviewed. The risks of infection,  prolonged antibiotics, reoperation for abscess or hematoma formation was discussed. The risks of hemorrhage necessitating transfusion and the risks of transfusion reaction,  hepatitis, HIV, mad cow disease and other unknown entities was also discussed. Incisional complications to include opening and draining of incisions and closure by secondary intention, dehiscence and long-term issues of keloid/cosmetics and hernia formation were reviewed. The risk of inadvertent injury to internal organs including bowel, bladder, ureters, vessels, nerves either immediately recognized or delay recognized necessitating major exploratory reparative surgeries and future reparative surgeries including bowel resection, ostomy formation, bladder repair, ureteral damage repair was discussed with her.  She understands that due to her prior surgeries to include the open myomectomy, tubal sterilization and cesarean section x2 there can be distortion of anatomy and scarring which may increase her risk of internal organ damage during the procedure.  The patient's questions were answered to her satisfaction and she is ready to proceed with surgery.   Anastasio Auerbach MD, 10:44 AM 05/31/2018

## 2018-05-31 NOTE — H&P (Signed)
Peggy Hicks [birth year] [id number]   History and Physical  Chief complaint: Symptomatic leiomyoma  History of present illness: 53 y.o. G2P2002 with long history of leiomyoma to include prior open myomectomy and hysteroscopic myomectomy.  Now palpable above the symphysis pubis to the patient and causing pelvic discomfort and pressure symptoms.  Exam shows bulky 16-week size uterus.  Ultrasound confirms multiple myomas.  Right and left ovaries appear normal.  Options for management reviewed and patient elects for TAH bilateral salpingectomies.  Past Medical History:  Diagnosis Date  . Fibroids   . Hx of adenomatous colonic polyps 04/14/2014  . Hyperhidrosis   . Stress reaction     Past Surgical History:  Procedure Laterality Date  . BREAST BIOPSY    . BREAST SURGERY     Breast Reduction  . CESAREAN SECTION      X 2  . HYSTEROSCOPY    . MYOMECTOMY     Uterine fibroids removed/ hysteroscopic submucous myomectomy  . TUBAL LIGATION      Family History  Problem Relation Age of Onset  . Hypertension Mother   . Heart disease Mother   . Diabetes Mother   . Hypertension Father   . Heart disease Father   . Cancer Maternal Aunt        liver  . Colon cancer Neg Hx   . Esophageal cancer Neg Hx   . Rectal cancer Neg Hx   . Stomach cancer Neg Hx     Social History:  reports that she has never smoked. She has never used smokeless tobacco. She reports current alcohol use. She reports that she does not use drugs.  Allergies: No Known Allergies  Medications: See Epic for the most current list of medications.  ROS:  Was performed and pertinent positives and negatives are included in the history of present illness.  Exam:  [name] assistant Vitals:   05/31/18 1020  BP: 124/74  Weight: 178 lb (80.7 kg)  Height: 5\' 6"  (1.676 m)   Body mass index is 28.73 kg/m.  General appearance:  Normal affect, orientation and appearance. Skin: Grossly normal HEENT: Without gross  lesions.  No cervical or supraclavicular adenopathy. Thyroid normal.  Lungs:  Clear without wheezing, rales or rhonchi Cardiac: RR, without RMG Abdominal:  Soft, nontender, with palpable uterus above the symphysis.  No other masses guarding rebound or hernia. Breasts:  Examined lying and sitting without masses, retractions, discharge or axillary adenopathy. Pelvic:             Ext, BUS, Vagina: Normal             Cervix: Normal             Uterus: Bulky 16 weeks size              Adnexa: Unable to evaluate due to bulk of the uterus             Anus and perineum: Normal              Rectovaginal: Normal sphincter tone without palpated masses or tenderness.     Assessment/Plan:  53 y.o. [id number] with long history of leiomyoma status post open myomectomy as well as hysteroscopic myomectomy.  Now able to palpate her uterus above the symphysis and is now causing her pressure and discomfort symptoms.  Has continue with monthly menses although skipped January and awaiting February.  Not having significant hot flashes or sweats.  We have discussed the options for management and  she wants to proceed with hysterectomy.  Approaches to include attempted robotic versus open discussed and she wants to proceed with open hysterectomy.  We discussed again the ovarian conservation issue and she would like to keep both ovaries for a more gradual transition through menopause.  We discussed the risks of keeping her ovaries to include ovarian disease in the future including ovarian cancer.  She understands that she is transitioning into menopause now and that her ovaries may not continue to function hormonally for much longer and that she may face HRT regardless.  Risks of HRT to include thrombosis issue and breast cancer also discussed.  At this point the patient strongly wants to keep both ovaries but does give me permission to remove one or both if it is my intraoperative best decision if they appear diseased or  technically require it.  We will plan on bilateral salpingectomies for risk reductive surgery.  She understands hysterectomy is absolute irreversible sterility.  Sexuality following hysterectomy also discussed and the potential for persistent orgasmic dysfunction as well as persistent dyspareunia was reviewed.  The expected intraoperative and postoperative courses as well as the recovery period were reviewed. The risks of infection, prolonged antibiotics, reoperation for abscess or hematoma formation was discussed. The risks of hemorrhage necessitating transfusion and the risks of transfusion reaction, hepatitis, HIV, mad cow disease and other unknown entities was also discussed. Incisional complications to include opening and draining of incisions and closure by secondary intention, dehiscence and long-term issues of keloid/cosmetics and hernia formation were reviewed. The risk of inadvertent injury to internal organs including bowel, bladder, ureters, vessels, nerves either immediately recognized or delay recognized necessitating major exploratory reparative surgeries and future reparative surgeries including bowel resection, ostomy formation, bladder repair, ureteral damage repair was discussed with her.  She understands that due to her prior surgeries to include the open myomectomy, tubal sterilization and cesarean section x2 there can be distortion of anatomy and scarring which may increase her risk of internal organ damage during the procedure.  The patient's questions were answered to her satisfaction and she is ready to proceed with surgery.    Anastasio Auerbach MD, 10:56 AM 05/31/2018

## 2018-06-04 ENCOUNTER — Encounter (HOSPITAL_BASED_OUTPATIENT_CLINIC_OR_DEPARTMENT_OTHER)
Admission: RE | Disposition: A | Discharge status: Home / Self Care | Payer: Self-pay | Source: Other Acute Inpatient Hospital | Attending: Gynecology

## 2018-06-04 ENCOUNTER — Ambulatory Visit (HOSPITAL_BASED_OUTPATIENT_CLINIC_OR_DEPARTMENT_OTHER)
Admission: RE | Admit: 2018-06-04 | Discharge: 2018-06-05 | Disposition: A | Discharge status: Home / Self Care | Payer: Managed Care, Other (non HMO) | Source: Other Acute Inpatient Hospital | Attending: Gynecology | Admitting: Gynecology

## 2018-06-04 ENCOUNTER — Ambulatory Visit (HOSPITAL_BASED_OUTPATIENT_CLINIC_OR_DEPARTMENT_OTHER): Payer: Managed Care, Other (non HMO) | Admitting: Anesthesiology

## 2018-06-04 ENCOUNTER — Encounter (HOSPITAL_BASED_OUTPATIENT_CLINIC_OR_DEPARTMENT_OTHER): Payer: Self-pay | Admitting: *Deleted

## 2018-06-04 ENCOUNTER — Ambulatory Visit (HOSPITAL_BASED_OUTPATIENT_CLINIC_OR_DEPARTMENT_OTHER): Payer: Managed Care, Other (non HMO) | Admitting: Physician Assistant

## 2018-06-04 DIAGNOSIS — D251 Intramural leiomyoma of uterus: Secondary | ICD-10-CM | POA: Diagnosis not present

## 2018-06-04 DIAGNOSIS — D259 Leiomyoma of uterus, unspecified: Secondary | ICD-10-CM

## 2018-06-04 DIAGNOSIS — N736 Female pelvic peritoneal adhesions (postinfective): Secondary | ICD-10-CM | POA: Insufficient documentation

## 2018-06-04 DIAGNOSIS — N800 Endometriosis of uterus: Secondary | ICD-10-CM | POA: Insufficient documentation

## 2018-06-04 DIAGNOSIS — N888 Other specified noninflammatory disorders of cervix uteri: Secondary | ICD-10-CM | POA: Insufficient documentation

## 2018-06-04 DIAGNOSIS — D219 Benign neoplasm of connective and other soft tissue, unspecified: Secondary | ICD-10-CM | POA: Diagnosis present

## 2018-06-04 DIAGNOSIS — D250 Submucous leiomyoma of uterus: Secondary | ICD-10-CM | POA: Diagnosis not present

## 2018-06-04 DIAGNOSIS — D252 Subserosal leiomyoma of uterus: Secondary | ICD-10-CM

## 2018-06-04 HISTORY — PX: HYSTERECTOMY ABDOMINAL WITH SALPINGECTOMY: SHX6725

## 2018-06-04 LAB — POCT PREGNANCY, URINE: Preg Test, Ur: NEGATIVE

## 2018-06-04 SURGERY — HYSTERECTOMY, TOTAL, ABDOMINAL, WITH SALPINGECTOMY
Anesthesia: General | Site: Abdomen | Laterality: Bilateral

## 2018-06-04 MED ORDER — MIDAZOLAM HCL 2 MG/2ML IJ SOLN
INTRAMUSCULAR | Status: AC
  Filled 2018-06-04: qty 2

## 2018-06-04 MED ORDER — SCOPOLAMINE 1 MG/3DAYS TD PT72
MEDICATED_PATCH | TRANSDERMAL | Status: DC | PRN
  Administered 2018-06-04: 1 via TRANSDERMAL

## 2018-06-04 MED ORDER — KETOROLAC TROMETHAMINE 30 MG/ML IJ SOLN
INTRAMUSCULAR | Status: AC
  Filled 2018-06-04: qty 1

## 2018-06-04 MED ORDER — IBUPROFEN 200 MG PO TABS
ORAL_TABLET | ORAL | Status: AC
  Filled 2018-06-04: qty 4

## 2018-06-04 MED ORDER — ACETAMINOPHEN 500 MG PO TABS
ORAL_TABLET | ORAL | Status: AC
  Filled 2018-06-04: qty 2

## 2018-06-04 MED ORDER — FENTANYL CITRATE (PF) 100 MCG/2ML IJ SOLN
INTRAMUSCULAR | Status: AC
  Filled 2018-06-04: qty 2

## 2018-06-04 MED ORDER — SODIUM CHLORIDE 0.9 % IV SOLN
INTRAVENOUS | Status: AC
  Filled 2018-06-04: qty 2

## 2018-06-04 MED ORDER — IBUPROFEN 800 MG PO TABS
800.0000 mg | ORAL_TABLET | Freq: Four times a day (QID) | ORAL | Status: DC
  Filled 2018-06-04: qty 1

## 2018-06-04 MED ORDER — LACTATED RINGERS IV SOLN
INTRAVENOUS | Status: DC
  Administered 2018-06-04 (×2): via INTRAVENOUS
  Filled 2018-06-04: qty 1000

## 2018-06-04 MED ORDER — NALOXONE HCL 0.4 MG/ML IJ SOLN
0.4000 mg | INTRAMUSCULAR | Status: DC | PRN
  Filled 2018-06-04: qty 1

## 2018-06-04 MED ORDER — SUGAMMADEX SODIUM 200 MG/2ML IV SOLN
INTRAVENOUS | Status: AC
  Filled 2018-06-04: qty 2

## 2018-06-04 MED ORDER — ONDANSETRON HCL 4 MG/2ML IJ SOLN
INTRAMUSCULAR | Status: AC
  Filled 2018-06-04: qty 2

## 2018-06-04 MED ORDER — EPHEDRINE SULFATE-NACL 50-0.9 MG/10ML-% IV SOSY
PREFILLED_SYRINGE | INTRAVENOUS | Status: DC | PRN
  Administered 2018-06-04 (×2): 10 mg via INTRAVENOUS

## 2018-06-04 MED ORDER — KETAMINE HCL 10 MG/ML IJ SOLN
INTRAMUSCULAR | Status: DC | PRN
  Administered 2018-06-04: 40 mg via INTRAVENOUS

## 2018-06-04 MED ORDER — METOCLOPRAMIDE HCL 5 MG/ML IJ SOLN
10.0000 mg | Freq: Once | INTRAMUSCULAR | Status: DC | PRN
  Filled 2018-06-04: qty 2

## 2018-06-04 MED ORDER — LABETALOL HCL 5 MG/ML IV SOLN
INTRAVENOUS | Status: DC | PRN
  Administered 2018-06-04 (×2): 5 mg via INTRAVENOUS

## 2018-06-04 MED ORDER — DEXAMETHASONE SODIUM PHOSPHATE 10 MG/ML IJ SOLN
INTRAMUSCULAR | Status: AC
  Filled 2018-06-04: qty 1

## 2018-06-04 MED ORDER — LABETALOL HCL 5 MG/ML IV SOLN
INTRAVENOUS | Status: AC
  Filled 2018-06-04: qty 4

## 2018-06-04 MED ORDER — EPHEDRINE 5 MG/ML INJ
INTRAVENOUS | Status: AC
  Filled 2018-06-04: qty 10

## 2018-06-04 MED ORDER — SODIUM CHLORIDE 0.9% FLUSH
9.0000 mL | INTRAVENOUS | Status: DC | PRN
  Filled 2018-06-04: qty 10

## 2018-06-04 MED ORDER — BUPIVACAINE-EPINEPHRINE (PF) 0.25% -1:200000 IJ SOLN
INTRAMUSCULAR | Status: DC | PRN
  Administered 2018-06-04: 30 mL
  Administered 2018-06-04: 30 mL via PERINEURAL

## 2018-06-04 MED ORDER — SODIUM CHLORIDE (PF) 0.9 % IJ SOLN
INTRAMUSCULAR | Status: DC | PRN
  Administered 2018-06-04: 50 mL via INTRAVENOUS

## 2018-06-04 MED ORDER — KETOROLAC TROMETHAMINE 30 MG/ML IJ SOLN
30.0000 mg | Freq: Four times a day (QID) | INTRAMUSCULAR | Status: AC
  Administered 2018-06-04 – 2018-06-05 (×3): 30 mg via INTRAVENOUS
  Filled 2018-06-04: qty 1

## 2018-06-04 MED ORDER — DIPHENHYDRAMINE HCL 50 MG/ML IJ SOLN
12.5000 mg | Freq: Four times a day (QID) | INTRAMUSCULAR | Status: DC | PRN
  Filled 2018-06-04: qty 0.25

## 2018-06-04 MED ORDER — PROPOFOL 10 MG/ML IV BOLUS
INTRAVENOUS | Status: AC
  Filled 2018-06-04: qty 40

## 2018-06-04 MED ORDER — SUGAMMADEX SODIUM 200 MG/2ML IV SOLN
INTRAVENOUS | Status: DC | PRN
  Administered 2018-06-04: 200 mg via INTRAVENOUS

## 2018-06-04 MED ORDER — ROCURONIUM BROMIDE 10 MG/ML (PF) SYRINGE
PREFILLED_SYRINGE | INTRAVENOUS | Status: DC | PRN
  Administered 2018-06-04: 50 mg via INTRAVENOUS

## 2018-06-04 MED ORDER — ONDANSETRON HCL 4 MG/2ML IJ SOLN
4.0000 mg | Freq: Four times a day (QID) | INTRAMUSCULAR | Status: DC | PRN
  Filled 2018-06-04: qty 2

## 2018-06-04 MED ORDER — ONDANSETRON HCL 4 MG/2ML IJ SOLN
INTRAMUSCULAR | Status: DC | PRN
  Administered 2018-06-04 (×2): 4 mg via INTRAVENOUS

## 2018-06-04 MED ORDER — LIDOCAINE 2% (20 MG/ML) 5 ML SYRINGE
INTRAMUSCULAR | Status: AC
  Filled 2018-06-04: qty 5

## 2018-06-04 MED ORDER — KETOROLAC TROMETHAMINE 30 MG/ML IJ SOLN
30.0000 mg | Freq: Four times a day (QID) | INTRAMUSCULAR | Status: DC
  Filled 2018-06-04: qty 1

## 2018-06-04 MED ORDER — KETOROLAC TROMETHAMINE 30 MG/ML IJ SOLN
30.0000 mg | Freq: Once | INTRAMUSCULAR | Status: DC
  Filled 2018-06-04: qty 1

## 2018-06-04 MED ORDER — KETAMINE HCL 10 MG/ML IJ SOLN
INTRAMUSCULAR | Status: AC
  Filled 2018-06-04: qty 1

## 2018-06-04 MED ORDER — MORPHINE SULFATE 2 MG/ML IV SOLN
INTRAVENOUS | Status: DC
  Administered 2018-06-04: 12:00:00 via INTRAVENOUS
  Administered 2018-06-04: 9 mg via INTRAVENOUS
  Filled 2018-06-04 (×2): qty 30

## 2018-06-04 MED ORDER — FENTANYL CITRATE (PF) 100 MCG/2ML IJ SOLN
25.0000 ug | INTRAMUSCULAR | Status: DC | PRN
  Administered 2018-06-04 (×4): 25 ug via INTRAVENOUS
  Filled 2018-06-04: qty 1

## 2018-06-04 MED ORDER — DEXAMETHASONE SODIUM PHOSPHATE 10 MG/ML IJ SOLN
INTRAMUSCULAR | Status: DC | PRN
  Administered 2018-06-04: 10 mg via INTRAVENOUS

## 2018-06-04 MED ORDER — MEPERIDINE HCL 25 MG/ML IJ SOLN
6.2500 mg | INTRAMUSCULAR | Status: DC | PRN
  Filled 2018-06-04: qty 1

## 2018-06-04 MED ORDER — MIDAZOLAM HCL 2 MG/2ML IJ SOLN
INTRAMUSCULAR | Status: DC | PRN
  Administered 2018-06-04: 2 mg via INTRAVENOUS

## 2018-06-04 MED ORDER — PROPOFOL 10 MG/ML IV BOLUS
INTRAVENOUS | Status: DC | PRN
  Administered 2018-06-04: 150 mg via INTRAVENOUS
  Administered 2018-06-04: 20 mg via INTRAVENOUS

## 2018-06-04 MED ORDER — FENTANYL CITRATE (PF) 100 MCG/2ML IJ SOLN
INTRAMUSCULAR | Status: DC | PRN
  Administered 2018-06-04 (×2): 50 ug via INTRAVENOUS
  Administered 2018-06-04: 100 ug via INTRAVENOUS

## 2018-06-04 MED ORDER — SCOPOLAMINE 1 MG/3DAYS TD PT72
MEDICATED_PATCH | TRANSDERMAL | Status: AC
  Filled 2018-06-04: qty 1

## 2018-06-04 MED ORDER — BUPIVACAINE LIPOSOME 1.3 % IJ SUSP
INTRAMUSCULAR | Status: DC | PRN
  Administered 2018-06-04: 8.5 mL

## 2018-06-04 MED ORDER — VASOPRESSIN 20 UNIT/ML IV SOLN
INTRAVENOUS | Status: DC | PRN
  Administered 2018-06-04: 1 mL via INTRAMUSCULAR

## 2018-06-04 MED ORDER — ACETAMINOPHEN 325 MG PO TABS
ORAL_TABLET | ORAL | Status: DC | PRN
  Administered 2018-06-04: 1000 mg via ORAL

## 2018-06-04 MED ORDER — OXYCODONE-ACETAMINOPHEN 5-325 MG PO TABS
2.0000 | ORAL_TABLET | ORAL | Status: DC | PRN
  Filled 2018-06-04: qty 2

## 2018-06-04 MED ORDER — SODIUM CHLORIDE 0.9 % IV SOLN
2.0000 g | INTRAVENOUS | Status: AC
  Administered 2018-06-04: 2 g via INTRAVENOUS
  Filled 2018-06-04: qty 2

## 2018-06-04 MED ORDER — LIDOCAINE 2% (20 MG/ML) 5 ML SYRINGE
INTRAMUSCULAR | Status: DC | PRN
  Administered 2018-06-04: 100 mg via INTRAVENOUS

## 2018-06-04 MED ORDER — DEXTROSE-NACL 5-0.9 % IV SOLN
INTRAVENOUS | Status: DC
  Administered 2018-06-04: 11:00:00 via INTRAVENOUS
  Administered 2018-06-04: 125 mL via INTRAVENOUS
  Filled 2018-06-04 (×3): qty 1000

## 2018-06-04 MED ORDER — KETOROLAC TROMETHAMINE 30 MG/ML IJ SOLN
INTRAMUSCULAR | Status: DC | PRN
  Administered 2018-06-04: 30 mg via INTRAVENOUS

## 2018-06-04 MED ORDER — ARTIFICIAL TEARS OPHTHALMIC OINT
TOPICAL_OINTMENT | OPHTHALMIC | Status: AC
  Filled 2018-06-04: qty 3.5

## 2018-06-04 MED ORDER — DIPHENHYDRAMINE HCL 12.5 MG/5ML PO ELIX
12.5000 mg | ORAL_SOLUTION | Freq: Four times a day (QID) | ORAL | Status: DC | PRN
  Filled 2018-06-04: qty 5

## 2018-06-04 MED ORDER — LACTATED RINGERS IV SOLN
INTRAVENOUS | Status: DC
  Administered 2018-06-04: 11:00:00 via INTRAVENOUS
  Filled 2018-06-04 (×2): qty 1000

## 2018-06-04 MED ORDER — ROCURONIUM BROMIDE 100 MG/10ML IV SOLN
INTRAVENOUS | Status: AC
  Filled 2018-06-04: qty 1

## 2018-06-04 SURGICAL SUPPLY — 38 items
BENZOIN TINCTURE PRP APPL 2/3 (GAUZE/BANDAGES/DRESSINGS) ×1 IMPLANT
CANISTER SUCT 3000ML PPV (MISCELLANEOUS) ×2 IMPLANT
CHLORAPREP W/TINT 26ML (MISCELLANEOUS) ×1 IMPLANT
CONT PATH 16OZ SNAP LID 3702 (MISCELLANEOUS) ×2 IMPLANT
COVER WAND RF STERILE (DRAPES) ×2 IMPLANT
DECANTER SPIKE VIAL GLASS SM (MISCELLANEOUS) IMPLANT
DRAPE LAPAROSCOPIC ABDOMINAL (DRAPES) ×2 IMPLANT
DRAPE WARM FLUID 44X44 (DRAPE) ×1 IMPLANT
DRSG OPSITE POSTOP 4X10 (GAUZE/BANDAGES/DRESSINGS) ×2 IMPLANT
DURAPREP 26ML APPLICATOR (WOUND CARE) ×1 IMPLANT
GAUZE SPONGE 4X4 16PLY XRAY LF (GAUZE/BANDAGES/DRESSINGS) ×1 IMPLANT
GLOVE BIO SURGEON STRL SZ 6.5 (GLOVE) ×1 IMPLANT
GLOVE BIO SURGEON STRL SZ7 (GLOVE) ×3 IMPLANT
GLOVE BIO SURGEON STRL SZ7.5 (GLOVE) ×4 IMPLANT
GLOVE BIOGEL PI IND STRL 6.5 (GLOVE) IMPLANT
GLOVE BIOGEL PI IND STRL 7.0 (GLOVE) ×3 IMPLANT
GLOVE BIOGEL PI INDICATOR 6.5 (GLOVE) ×1
GLOVE BIOGEL PI INDICATOR 7.0 (GLOVE) ×4
GOWN STRL REUS W/TWL LRG LVL3 (GOWN DISPOSABLE) ×8 IMPLANT
HEMOSTAT ARISTA ABSORB 3G PWDR (HEMOSTASIS) ×1 IMPLANT
NEEDLE HYPO 22GX1.5 SAFETY (NEEDLE) ×2 IMPLANT
NS IRRIG 1000ML POUR BTL (IV SOLUTION) ×2 IMPLANT
PACK ABDOMINAL GYN (CUSTOM PROCEDURE TRAY) ×2 IMPLANT
PAD OB MATERNITY 4.3X12.25 (PERSONAL CARE ITEMS) ×2 IMPLANT
PROTECTOR NERVE ULNAR (MISCELLANEOUS) ×2 IMPLANT
RETAINER VISCERAL (MISCELLANEOUS) IMPLANT
SPONGE LAP 18X18 RF (DISPOSABLE) ×4 IMPLANT
STRIP CLOSURE SKIN 1/2X4 (GAUZE/BANDAGES/DRESSINGS) ×1 IMPLANT
SUT VIC AB 0 CT1 18XCR BRD8 (SUTURE) ×3 IMPLANT
SUT VIC AB 0 CT1 27 (SUTURE) ×2
SUT VIC AB 0 CT1 27XBRD ANBCTR (SUTURE) ×3 IMPLANT
SUT VIC AB 0 CT1 8-18 (SUTURE) ×2
SUT VIC AB 4-0 KS 27 (SUTURE) ×2 IMPLANT
SUT VICRYL 0 TIES 12 18 (SUTURE) ×2 IMPLANT
SYR BULB IRRIGATION 50ML (SYRINGE) ×2 IMPLANT
SYR CONTROL 10ML LL (SYRINGE) ×2 IMPLANT
TOWEL OR 17X26 10 PK STRL BLUE (TOWEL DISPOSABLE) ×4 IMPLANT
TRAY FOLEY W/BAG SLVR 14FR (SET/KITS/TRAYS/PACK) ×2 IMPLANT

## 2018-06-04 NOTE — Anesthesia Preprocedure Evaluation (Signed)
Anesthesia Evaluation  Patient identified by MRN, date of birth, ID band Patient awake    Reviewed: Allergy & Precautions, NPO status , Patient's Chart, lab work & pertinent test results  Airway Mallampati: II  TM Distance: >3 FB Neck ROM: Full    Dental no notable dental hx.    Pulmonary neg pulmonary ROS,    Pulmonary exam normal breath sounds clear to auscultation       Cardiovascular negative cardio ROS Normal cardiovascular exam Rhythm:Regular Rate:Normal     Neuro/Psych negative neurological ROS  negative psych ROS   GI/Hepatic negative GI ROS, Neg liver ROS,   Endo/Other  negative endocrine ROS  Renal/GU negative Renal ROS  negative genitourinary   Musculoskeletal negative musculoskeletal ROS (+)   Abdominal   Peds negative pediatric ROS (+)  Hematology negative hematology ROS (+)   Anesthesia Other Findings   Reproductive/Obstetrics negative OB ROS                             Anesthesia Physical Anesthesia Plan  ASA: II  Anesthesia Plan: General   Post-op Pain Management:  Regional for Post-op pain   Induction: Intravenous  PONV Risk Score and Plan: 3 and Ondansetron, Dexamethasone, Midazolam and Treatment may vary due to age or medical condition  Airway Management Planned: Oral ETT  Additional Equipment:   Intra-op Plan:   Post-operative Plan: Extubation in OR  Informed Consent: I have reviewed the patients History and Physical, chart, labs and discussed the procedure including the risks, benefits and alternatives for the proposed anesthesia with the patient or authorized representative who has indicated his/her understanding and acceptance.     Dental advisory given  Plan Discussed with: CRNA  Anesthesia Plan Comments:         Anesthesia Quick Evaluation

## 2018-06-04 NOTE — Anesthesia Procedure Notes (Addendum)
Anesthesia Regional Block: TAP block   Pre-Anesthetic Checklist: ,, timeout performed, Correct Patient, Correct Site, Correct Laterality, Correct Procedure, Correct Position, site marked, Risks and benefits discussed,  Surgical consent,  Pre-op evaluation,  At surgeon's request and post-op pain management  Laterality: Left  Prep: Maximum Sterile Barrier Precautions used, chloraprep       Needles:  Injection technique: Single-shot  Needle Type: Echogenic Stimulator Needle     Needle Length: 10cm      Additional Needles:   Procedures:,,,, ultrasound used (permanent image in chart),,,,  Narrative:  Start time: 06/04/2018 6:55 AM End time: 06/04/2018 7:00 AM Injection made incrementally with aspirations every 5 mL.  Performed by: Personally  Anesthesiologist: Montez Hageman, MD  Additional Notes: Risks, benefits and alternative to block explained extensively.  Patient tolerated procedure well, without complications.

## 2018-06-04 NOTE — H&P (Signed)
The patient was examined.  I reviewed the proposed surgery and consent form with the patient.  The dictated history and physical is current and accurate and all questions were answered.  I again confirmed that the patient wants to keep both ovaries.  She signed a consent form to allow for removal of both ovaries in the event in my intraoperative estimate I feel one or both ovaries should be removed but she wants to keep both ovaries if possible.  The patient is ready to proceed with surgery and has a realistic understanding and expectation for the outcome.   Anastasio Auerbach MD, 7:06 AM 06/04/2018

## 2018-06-04 NOTE — Transfer of Care (Signed)
Last Vitals:  Vitals Value Taken Time  BP 158/100 06/04/2018  9:27 AM  Temp    Pulse 58 06/04/2018  9:29 AM  Resp 21 06/04/2018  9:29 AM  SpO2 100 % 06/04/2018  9:29 AM  Vitals shown include unvalidated device data.  Last Pain:  Vitals:   06/04/18 0556  TempSrc: Oral         Immediate Anesthesia Transfer of Care Note  Patient: Peggy Hicks  Procedure(s) Performed: Procedure(s) (LRB): HYSTERECTOMY ABDOMINAL WITH SALPINGECTOMY (Bilateral)  Patient Location: PACU  Anesthesia Type: General  Level of Consciousness: awake, alert  and oriented  Airway & Oxygen Therapy: Patient Spontanous Breathing and Patient connected to nasal cannula oxygen  Post-op Assessment: Report given to PACU RN and Post -op Vital signs reviewed and stable  Post vital signs: Reviewed and stable  Complications: No apparent anesthesia complications

## 2018-06-04 NOTE — Op Note (Addendum)
MARISSIA BLACKHAM Aug 22, 1965 [id number]   Post Operative Note   Date of surgery:  06/04/2018  Pre Op Dx: Symptomatic leiomyoma  Post Op Dx: Symptomatic leiomyoma, severe pelvic adhesions  Procedure: TAH, bilateral salpingectomies, extensive lysis of adhesions  Surgeon:  Anastasio Auerbach  Assistant: Princess Bruins  Anesthesia:  General  EBL: 867 cc  Complications:  None  Specimen: Uterus and bilateral segments of fallopian tubes to pathology.  Clinical weight 529 g  Findings: Anterior cul-de-sac with bladder flap adhesions to mid anterior uterine surface.  Posterior cul-de-sac with multiple intestinal adhesions to the posterior and lateral aspects of the uterine serosa.  Uterus grossly enlarged with multiple palpable myomas.  Right and left ovaries grossly normal.  Right and left distal fallopian tube segments grossly normal.  Upper abdominal exam shows appendix free and mobile.  Liver palpates normal.  No upper abdominal adhesions palpated.     Procedure: The patient was taken to the operating room after receiving a TAP block by anesthesia.  She underwent general endotracheal anesthesia and received an abdominal and vaginal preparation per nursing personnel and a Foley catheter was placed in sterile technique.  The patient was draped in the usual fashion and the abdomen was sharply entered through a Pfannenstiel incision achieving adequate hemostasis at all levels.  The pelvis was examined as well as an upper abdominal exam with findings noted above.  The uterus was delivered through the incision and during this process multiple small and large bowel posterior uterine adhesions were lysed to facilitate uterine mobilization.  The right and left round ligaments were ligated using 0 Vicryl suture and subsequently transected again to facilitate uterine mobilization.  The anterior bladder flap was sharply and bluntly developed freeing it from the anterior uterine surface.  The left  utero-ovarian pedicle was skeletonized, doubly clamped cut and ligated using 0 Vicryl suture.  A similar procedure was carried out on the other side.  A Balfour retractor and bladder blade were then placed within the incision and the intestines were packed from the operative field.  The right and left uterine vessels were then clamped cut and ligated using 0 Vicryl suture at the level of the lower uterine segment.  The uterus was progressively freed from its attachments through clamping cutting and ligating of the parametrial tissues as well as the cardinal ligaments using 0 Vicryl suture, advancing the bladder flap as needed..  The vagina was then crossclamped and the uterus with attached cervix was excised.  Right and left vaginal angle sutures were placed using 0 Vicryl suture and the intervening vaginal cuff was closed anterior to posterior using 0 Vicryl suture in interrupted figure-of-eight stitch.  Attention was then turned to the fallopian tube segments and the left ovary with attached fallopian tube was elevated and the mesosalpinx clamped and the distal remaining fallopian tube segment was excised.  Hemostasis was achieved with electrocautery along the mesosalpinx.  A similar procedure was carried out on the other side.  The pelvis was then copiously irrigated with several small bleeding sites addressed using electrocautery.  Arista hemostatic agent was then placed prophylactically into the pelvis and bladder flap, the Balfour retractor and bladder blade removed along with the bowel packing and the fascia was reapproximated using 0 Vicryl suture starting at the angle meeting in the middle.  The subcutaneous tissues were irrigated with hemostasis achieved with electrocautery and Exparel was injected into the surrounding subcutaneous fascial layers.  The skin was reapproximated using 4-0 Vicryl in a running  subcuticular stitch, benzoin and Steri-Strips applied as well as a sterile dressing.  The sponge,  needle and instrument count were verified correct.  The patient was wanded according to protocol.  The urine was noted to remain clear yellow throughout the case with over 200 cc at the end of the procedure in the Foley bag.  The patient was awakened without difficulty and taken recovery room in good condition having tolerated procedure well.  The above procedure took longer than usual due to the extensive lysis of adhesions of both the bladder to the anterior uterine surface as well as the multiple bowel adhesions to the fundus/posterior aspect of the uterine serosa.  This also required the first assist of a physician.   Anastasio Auerbach MD, 9:34 AM 06/04/2018

## 2018-06-04 NOTE — Progress Notes (Signed)
Dr. Marcell Barlow in to do bilateral TAP block.

## 2018-06-04 NOTE — Anesthesia Procedure Notes (Signed)
Anesthesia Regional Block: TAP block   Pre-Anesthetic Checklist: ,, timeout performed, Correct Patient, Correct Site, Correct Laterality, Correct Procedure, Correct Position, site marked, Risks and benefits discussed,  Surgical consent,  Pre-op evaluation,  At surgeon's request and post-op pain management  Laterality: Right  Prep: Maximum Sterile Barrier Precautions used, chloraprep       Needles:  Injection technique: Single-shot  Needle Type: Echogenic Stimulator Needle     Needle Length: 10cm      Additional Needles:   Procedures:,,,, ultrasound used (permanent image in chart),,,,  Narrative:  Start time: 06/04/2018 7:01 AM End time: 06/04/2018 7:05 AM Injection made incrementally with aspirations every 5 mL.  Performed by: Personally  Anesthesiologist: Montez Hageman, MD  Additional Notes: Risks, benefits and alternative to block explained extensively.  Patient tolerated procedure well, without complications.

## 2018-06-04 NOTE — Anesthesia Postprocedure Evaluation (Signed)
Anesthesia Post Note  Patient: CHYNNA BUERKLE  Procedure(s) Performed: HYSTERECTOMY ABDOMINAL WITH SALPINGECTOMY (Bilateral Abdomen)     Patient location during evaluation: PACU Anesthesia Type: General Level of consciousness: awake and alert Pain management: pain level controlled Vital Signs Assessment: post-procedure vital signs reviewed and stable Respiratory status: spontaneous breathing, nonlabored ventilation, respiratory function stable and patient connected to nasal cannula oxygen Cardiovascular status: blood pressure returned to baseline and stable Postop Assessment: no apparent nausea or vomiting Anesthetic complications: no    Last Vitals:  Vitals:   06/04/18 1015 06/04/18 1030  BP: (!) 170/91 (!) 165/95  Pulse: 67 72  Resp: 13 14  Temp:    SpO2: 100% 100%    Last Pain:  Vitals:   06/04/18 1030  TempSrc:   PainSc: 8                  Montez Hageman

## 2018-06-04 NOTE — Progress Notes (Signed)
Peggy Hicks [birth year] [id number]   Day of Surgery s/p Procedure(s): HYSTERECTOMY ABDOMINAL WITH SALPINGECTOMY  Subjective: Patient is awake with no significant pain.  Has been up and walking and plans on taking another walk this evening with removal of her Foley.  Objective: Vital signs in last 24 hours: Temp:  [97.7 F (36.5 C)-98.8 F (37.1 C)] 98.4 F (36.9 C) (03/03 1535) Pulse Rate:  [53-93] 59 (03/03 1535) Resp:  [13-24] 14 (03/03 1535) BP: (106-170)/(73-100) 146/94 (03/03 1535) SpO2:  [94 %-100 %] 100 % (03/03 1535) Weight:  [81.8 kg] 81.8 kg (03/03 0556)    EXAM General: awake, alert and no distress Resp: clear to auscultation bilaterally Cardio: regular rate and rhythm GI: soft, minimal tenderness, bowel sounds decreased, dressing intact Lower Extremities: Without swelling or tenderness Vaginal Bleeding: Reported scant  Lab Results:  Results for orders placed or performed during the hospital encounter of 06/04/18 (from the past 24 hour(s))  Pregnancy, urine POC     Status: None   Collection Time: 06/04/18  6:19 AM  Result Value Ref Range   Preg Test, Ur NEGATIVE NEGATIVE    Assessment: s/p Procedure(s): HYSTERECTOMY ABDOMINAL WITH SALPINGECTOMY: stable and progressing well  Plan: Continue routine post operative care.  Advance diet as tolerated.  Switch to p.o. pain medication.  Remove Foley with ambulation.  I reviewed with the patient and her husband general postoperative instructions as far as activities, diet and ASAP call precautions.  Also discussed postoperative wound care.  Will tentatively plan on discharge tomorrow if she is doing well as far as pain relief   LOS: 0 days    Anastasio Auerbach MD, 4:42 PM 06/04/2018

## 2018-06-04 NOTE — Anesthesia Procedure Notes (Signed)
Procedure Name: Intubation Date/Time: 06/04/2018 7:41 AM Performed by: Montez Hageman, MD Pre-anesthesia Checklist: Patient identified, Emergency Drugs available, Suction available and Patient being monitored Patient Re-evaluated:Patient Re-evaluated prior to induction Oxygen Delivery Method: Circle system utilized Preoxygenation: Pre-oxygenation with 100% oxygen Induction Type: IV induction Ventilation: Mask ventilation without difficulty Laryngoscope Size: Mac and 3 Grade View: Grade I Tube type: Oral Tube size: 7.0 mm Number of attempts: 1 Airway Equipment and Method: Stylet Placement Confirmation: ETT inserted through vocal cords under direct vision,  positive ETCO2 and breath sounds checked- equal and bilateral Secured at: 21 cm Tube secured with: Tape Dental Injury: Teeth and Oropharynx as per pre-operative assessment

## 2018-06-05 ENCOUNTER — Encounter (HOSPITAL_BASED_OUTPATIENT_CLINIC_OR_DEPARTMENT_OTHER): Payer: Self-pay | Admitting: Gynecology

## 2018-06-05 ENCOUNTER — Encounter: Payer: Self-pay | Admitting: Gynecology

## 2018-06-05 DIAGNOSIS — N888 Other specified noninflammatory disorders of cervix uteri: Secondary | ICD-10-CM | POA: Diagnosis not present

## 2018-06-05 LAB — CBC
HCT: 37 % (ref 36.0–46.0)
Hemoglobin: 11.8 g/dL — ABNORMAL LOW (ref 12.0–15.0)
MCH: 28.4 pg (ref 26.0–34.0)
MCHC: 31.9 g/dL (ref 30.0–36.0)
MCV: 88.9 fL (ref 80.0–100.0)
Platelets: 252 10*3/uL (ref 150–400)
RBC: 4.16 MIL/uL (ref 3.87–5.11)
RDW: 14.8 % (ref 11.5–15.5)
WBC: 11 10*3/uL — ABNORMAL HIGH (ref 4.0–10.5)
nRBC: 0 % (ref 0.0–0.2)

## 2018-06-05 MED ORDER — KETOROLAC TROMETHAMINE 30 MG/ML IJ SOLN
INTRAMUSCULAR | Status: AC
  Filled 2018-06-05: qty 1

## 2018-06-05 MED ORDER — OXYCODONE-ACETAMINOPHEN 5-325 MG PO TABS: ORAL_TABLET | ORAL | 0 refills | Status: DC

## 2018-06-05 NOTE — Telephone Encounter (Signed)
That is the way it looked the day of surgery.  It was just a little bit of oozing right after surgery but is no worse than it looked yesterday.  I think it is old blood and that is okay till just leave it.

## 2018-06-05 NOTE — Telephone Encounter (Signed)
Dr. Loetta Rough see attached photo

## 2018-06-05 NOTE — Discharge Instructions (Signed)
°  Postoperative Instructions Hysterectomy ° °Dr. Irbin Fines and the nursing staff have discussed postoperative instructions with you.  If you have any questions please ask them before you leave the hospital, or call Dr Londyn Hotard’s office at [phone].   ° °We would like to emphasize the following instructions: ° ° °  Call the office to make your follow-up appointment as recommended by Dr Clarabelle Oscarson (usually 2 weeks). ° °  You were given a prescription, or one was ordered for you at the pharmacy you designated.  Get that prescription filled and take the medication according to instructions. ° °  You may eat a regular diet, but slowly until you start having bowel movements. ° °  Drink plenty of water daily. ° °  Nothing in the vagina (intercourse, douching, objects of any kind) until released by Dr Avyaan Summer. ° °  No driving for two weeks.  Wait to be cleared by Dr Paris Chiriboga at your first post op check.  Car rides (short) are ok after several days at home, as long as you are not having significant pain, but no traveling out of town. ° °  You may shower, but no baths.  Walking up and down stairs is ok.  No heavy lifting, prolonged standing, repeated bending or any “working out” until your first post op check. ° °  Rest frequently, listen to your body and do not push yourself and overdo it. ° °  Call if: ° °o Your pain medication does not seem strong enough. °o Worsening pain or abdominal bloating °o Persistent nausea or vomiting °o Difficulty with urination or bowel movements. °o Temperature of 101 degrees or higher. °o Bleeding heavier then staining (clots or period type flow). °o Incisions become red, tender or begin to drain. °o You have any questions or concerns. °

## 2018-06-05 NOTE — Discharge Summary (Signed)
Peggy Hicks [birth year] [id number]   Discharge Summary  Date of Admission:  06/04/2018  Date of Discharge:  06/05/2018  Discharge Diagnosis: Leiomyoma, adenomyosis  Procedure:  Procedure(s): HYSTERECTOMY ABDOMINAL WITH SALPINGECTOMY  Pathology:  Uterus, cervix and bilateral fallopian tubes CERVIX: - NABOTHIAN CYSTS. UTERUS: - ENDOMETRIUM: PROLIFERATIVE ENDOMETRIUM. - MYOMETRIUM: LEIOMYOMATA. ADENOMYOSIS. - SEROSA: SEROSAL ADHESIONS. ADNEXA: - FALLOPIAN TUBES: NO SIGNIFICANT HISTOPATHOLOGIC FINDINGS.  Hospital Course: Patient underwent an uncomplicated TAH bilateral salpingectomies 06/04/2018.  She was discharged on postoperative day #1 ambulating well, tolerating a regular diet, voiding without difficulty and with good pain relief on oral medication.  The patient received instructions for postoperative care and call precautions.  She received prescriptions per AVS and will be seen in the office 2 weeks following discharge.     Anastasio Auerbach MD, 4:48 PM 06/05/2018

## 2018-06-05 NOTE — Progress Notes (Signed)
Peggy Hicks Jan 28, 1966 [id number]   1 Day Post-Op Procedure(s) (LRB): HYSTERECTOMY ABDOMINAL WITH SALPINGECTOMY (Bilateral)  Subjective: Patient reports feels well, no acute distress, pain severity reported mild, Yes.   taking PO, foley catheter out, Yes.   voiding, Yes.   ambulating, Yes.   passing flatus  Objective: Vital signs in last 24 hours: Temp:  [97.7 F (36.5 C)-99.3 F (37.4 C)] 99.1 F (37.3 C) (03/04 0330) Pulse Rate:  [53-75] 63 (03/04 0330) Resp:  [13-24] 16 (03/04 0330) BP: (145-170)/(87-100) 145/88 (03/04 0330) SpO2:  [97 %-100 %] 99 % (03/04 0330)      EXAM General: awake, alert and no distress Resp: clear to auscultation bilaterally Cardio: regular rate and rhythm GI: soft, minimal tenderness, bowel sounds active, dressing dry intact Lower Extremities: Without swelling or tenderness Vaginal Bleeding: Reported scant   Lab Results:  Recent Labs    06/05/18 0517  WBC 11.0*  HGB 11.8*  HCT 37.0  PLT 252    Assessment: s/p Procedure(s): HYSTERECTOMY ABDOMINAL WITH SALPINGECTOMY: progressing well, ready for discharge.  Ambulating, eating, voiding with good pain relief on oral medication  Plan: I again reviewed the findings of surgery with the patient.  Plan discharge home today.  Precautions, instructions and follow up were discussed with the patient.  Prescriptions provided per AVS.  Patient to call the office to arrange a post-operative appointmant in 2 weeks.    Anastasio Auerbach MD, 7:27 AM 06/05/2018

## 2018-06-12 ENCOUNTER — Other Ambulatory Visit: Payer: Self-pay | Admitting: Gynecology

## 2018-06-12 NOTE — Telephone Encounter (Signed)
I called patient regarding refill on Percocet 5/325. I asked her about her pain, reports she is having a little pain, not much, she has been using Advil in between taking Percocet. I explained you don't typically refill this medication post surgery, and if you would recommend another medication to help with discomfort, or if you think the Advil alone will help, otherwise patient said she is doing.

## 2018-06-12 NOTE — Telephone Encounter (Signed)
I would recommend trying ibuprofen 800 mg prescription strength every 8 hours to see if this does not control her pain.  #40

## 2018-06-13 NOTE — Telephone Encounter (Signed)
Patient informed, she preferred to continue Advil. Rx was not sent, states ibuprofen cause yeast infections.

## 2018-06-18 ENCOUNTER — Encounter: Payer: Self-pay | Admitting: Gynecology

## 2018-06-19 ENCOUNTER — Encounter: Payer: Self-pay | Admitting: Gynecology

## 2018-06-19 ENCOUNTER — Other Ambulatory Visit: Payer: Self-pay

## 2018-06-19 ENCOUNTER — Ambulatory Visit (INDEPENDENT_AMBULATORY_CARE_PROVIDER_SITE_OTHER): Payer: Managed Care, Other (non HMO) | Admitting: Gynecology

## 2018-06-19 VITALS — BP 118/78

## 2018-06-19 DIAGNOSIS — Z9889 Other specified postprocedural states: Secondary | ICD-10-CM

## 2018-06-19 NOTE — Patient Instructions (Signed)
Slowly resume normal activities with the exception of nothing in the vagina and no strenuous abdominal exercises until you are 2 months out of surgery.    Follow-up next year for annual exam when due, sooner if any issues.

## 2018-06-19 NOTE — Progress Notes (Signed)
    Peggy Hicks 1965/10/15 [id number]        52 y.o.  [id number] presents for postop visit status post TAH bilateral salpingectomies.  Has done well.  Does note some numbness in the outer thigh right leg.   Past medical history,surgical history, problem list, medications, allergies, family history and social history were all reviewed and documented in the EPIC chart.  Directed ROS with pertinent positives and negatives documented in the history of present illness/assessment and plan.  Exam: [name] assistant Vitals:   06/19/18 1516  BP: 118/78   General appearance:  Normal Abdomen shows incision healed nicely.  No masses guarding or tenderness. Pelvic external BUS vagina with cuff intact.  Bimanual without masses or tenderness.  Assessment/Plan:  53 y.o. [id number] with normal postoperative visit status post TAH bilateral salpingectomies.  Reviewed pathology to include over 500 g uterus, leiomyoma and adenomyosis.  Some mild numbness outer thigh on the right.  Will monitor for now and follow-up if it continues to be an issue.  Otherwise she will slowly resume activities with the exception of continued pelvic rest and no strenuous abdominal activities until she is 8 weeks postop.  She will follow-up in 1 year when due for annual exam.    Anastasio Auerbach MD, 3:31 PM 06/19/2018

## 2018-06-26 ENCOUNTER — Encounter: Payer: Self-pay | Admitting: Gynecology

## 2018-06-27 NOTE — Telephone Encounter (Signed)
Patient sent 2 message this is additional information with the above. "having more constipation. Using the Miralax again "

## 2018-06-27 NOTE — Telephone Encounter (Signed)
Not unusual sometimes as the sutures resorb.  When she is straining to have the bowel movement it puts pressure on the top of the vagina which can cause the spotting.  I would recommend office visit at some point if it continues but I do not think she needs to rush and right now.

## 2018-06-29 ENCOUNTER — Encounter: Payer: Self-pay | Admitting: Gynecology

## 2018-07-01 ENCOUNTER — Other Ambulatory Visit: Payer: Self-pay

## 2018-07-01 ENCOUNTER — Encounter: Payer: Self-pay | Admitting: Gynecology

## 2018-07-01 ENCOUNTER — Ambulatory Visit: Payer: Managed Care, Other (non HMO) | Admitting: Gynecology

## 2018-07-01 VITALS — BP 118/78

## 2018-07-01 DIAGNOSIS — R300 Dysuria: Secondary | ICD-10-CM

## 2018-07-01 NOTE — Telephone Encounter (Signed)
Patient coming in today to see Dr Phineas Real.

## 2018-07-01 NOTE — Patient Instructions (Signed)
Follow-up if there are any issues

## 2018-07-01 NOTE — Progress Notes (Signed)
    Peggy Hicks 03-22-1966 [id number]        52 y.o.  [id number] presents noting bilateral lower back pain over the past week as well as some dysuria.  No frequency urgency fever or chills.  Also notes right incisional skin numbness.  Past medical history,surgical history, problem list, medications, allergies, family history and social history were all reviewed and documented in the EPIC chart.  Directed ROS with pertinent positives and negatives documented in the history of present illness/assessment and plan.  Exam: [name] assistant Vitals:   07/01/18 1007  BP: 118/78   General appearance:  Normal Spine straight without CVA tenderness Abdomen soft nontender without masses guarding rebound.  Incision healed nicely. Pelvic external BUS vagina normal with cuff healing nicely.  Bimanual without masses or tenderness  Assessment/Plan:  53 y.o. [id number] with low back pain and some dysuria.  Urine analysis is negative.  We discussed transient bladder dysfunction following hysterectomy is not unusual and I would continue with pushing fluids and monitoring for now.  If other symptoms present or dysuria continue she will follow-up for reevaluation.  We discussed the right peri-incisional numbness is not unusual following incisions to have a halo of numbness around the incision.  We discussed that this will more than likely return although not always in some patients will have numbness around their incisions forever.  The incision looks great and has healed nicely.  We will follow-up if any persistent issues.    Anastasio Auerbach MD, 10:39 AM 07/01/2018

## 2018-07-01 NOTE — Addendum Note (Signed)
Addended by: Nelva Nay on: 07/01/2018 10:53 AM   Modules accepted: Orders

## 2018-07-01 NOTE — Telephone Encounter (Signed)
patient would like to schedule visit with TF UTI

## 2018-07-02 LAB — URINALYSIS, COMPLETE W/RFL CULTURE
Bacteria, UA: NONE SEEN /HPF
Bilirubin Urine: NEGATIVE
Glucose, UA: NEGATIVE
Hgb urine dipstick: NEGATIVE
Hyaline Cast: NONE SEEN /LPF
Ketones, ur: NEGATIVE
Leukocyte Esterase: NEGATIVE
Nitrites, Initial: NEGATIVE
Protein, ur: NEGATIVE
RBC / HPF: NONE SEEN /HPF (ref 0–2)
Specific Gravity, Urine: 1.015 (ref 1.001–1.03)
WBC, UA: NONE SEEN /HPF (ref 0–5)
pH: 6 (ref 5.0–8.0)

## 2018-07-02 LAB — NO CULTURE INDICATED

## 2018-10-03 ENCOUNTER — Ambulatory Visit: Payer: Managed Care, Other (non HMO) | Admitting: Family Medicine

## 2018-10-03 ENCOUNTER — Ambulatory Visit (INDEPENDENT_AMBULATORY_CARE_PROVIDER_SITE_OTHER)
Admission: RE | Admit: 2018-10-03 | Discharge: 2018-10-03 | Disposition: A | Discharge status: Home / Self Care | Payer: Managed Care, Other (non HMO) | Source: Ambulatory Visit | Attending: Family Medicine | Admitting: Family Medicine

## 2018-10-03 ENCOUNTER — Encounter: Payer: Self-pay | Admitting: Family Medicine

## 2018-10-03 ENCOUNTER — Other Ambulatory Visit: Payer: Self-pay

## 2018-10-03 DIAGNOSIS — M25562 Pain in left knee: Secondary | ICD-10-CM | POA: Diagnosis not present

## 2018-10-03 NOTE — Progress Notes (Signed)
Subjective:    Patient ID: Peggy Hicks, female    DOB: [birth year], 53 y.o.   MRN: [id number]  HPI Here for a L knee injury    Wt Readings from Last 3 Encounters:  10/03/18 184 lb 7 oz (83.7 kg)  06/04/18 180 lb 4.8 oz (81.8 kg)  05/31/18 178 lb (80.7 kg)   30.23 kg/m   She fell about a year ago on pavement  On L knee   Anterior knee pain lately - worse since last Saturday (woke up with it)  Had started boot camp - doing squats/lunges/ and jumping jacks   Pain medial and anterior  Some swelling  Ice helped  Aleve also helped  No grinding   No old injuries or surgeries  No known arthritis    Patient Active Problem List   Diagnosis Date Noted  . Left knee pain 10/03/2018  . Leiomyoma 06/04/2018  . Daily headache 06/29/2017  . Hx of adenomatous colonic polyps 04/14/2014  . Left lumbar radiculopathy 12/10/2012  . Routine general medical examination at a health care facility 01/01/2012  . Stress reaction 11/13/2011  . Prediabetes 11/15/2010  . PRIMARY FOCAL HYPERHIDROSIS 10/22/2007  . Situational anxiety 08/01/2006   Past Medical History:  Diagnosis Date  . Fibroids   . Hx of adenomatous colonic polyps 04/14/2014  . Hyperhidrosis   . Stress reaction    Past Surgical History:  Procedure Laterality Date  . BREAST BIOPSY    . BREAST SURGERY     Breast Reduction  . CESAREAN SECTION      X 2  . HYSTERECTOMY ABDOMINAL WITH SALPINGECTOMY Bilateral 06/04/2018   Procedure: HYSTERECTOMY ABDOMINAL WITH SALPINGECTOMY;  Surgeon: Anastasio Auerbach, MD;  Location: [facility];  Service: Gynecology;  Laterality: Bilateral;  . HYSTEROSCOPY    . MYOMECTOMY     Uterine fibroids removed/ hysteroscopic submucous myomectomy  . TUBAL LIGATION     Social History   Tobacco Use  . Smoking status: Never Smoker  . Smokeless tobacco: Never Used  Substance Use Topics  . Alcohol use: Yes    Alcohol/week: 0.0 standard drinks    Comment: Rare  . Drug use: No    Family History  Problem Relation Age of Onset  . Hypertension Mother   . Heart disease Mother   . Diabetes Mother   . Hypertension Father   . Heart disease Father   . Cancer Maternal Aunt        liver  . Colon cancer Neg Hx   . Esophageal cancer Neg Hx   . Rectal cancer Neg Hx   . Stomach cancer Neg Hx    No Known Allergies Current Outpatient Medications on File Prior to Visit  Medication Sig Dispense Refill  . ALPRAZolam (XANAX) 0.5 MG tablet Take 1 tablet (0.5 mg total) by mouth daily as needed for anxiety. For traveling 30 tablet 0  . cetirizine (ZYRTEC) 10 MG tablet Take 10 mg by mouth daily as needed for allergies.    . fluticasone (FLONASE) 50 MCG/ACT nasal spray Place 1 spray into both nostrils daily as needed for allergies or rhinitis.    . Lactobacillus (DIGESTIVE HEALTH PROBIOTIC) CAPS Take 1 capsule by mouth daily.    . naproxen sodium (ALEVE) 220 MG tablet Take 440 mg by mouth daily as needed (pain).     No current facility-administered medications on file prior to visit.     Review of Systems  Constitutional: Negative for activity change, appetite change,  fatigue, fever and unexpected weight change.  HENT: Negative for congestion, ear pain, rhinorrhea, sinus pressure and sore throat.   Eyes: Negative for pain, redness and visual disturbance.  Respiratory: Negative for cough, shortness of breath and wheezing.   Cardiovascular: Negative for chest pain and palpitations.  Gastrointestinal: Negative for abdominal pain, blood in stool, constipation and diarrhea.  Endocrine: Negative for polydipsia and polyuria.  Genitourinary: Negative for dysuria, frequency and urgency.  Musculoskeletal: Positive for arthralgias. Negative for back pain and myalgias.       L knee pain and swelling   Skin: Negative for pallor and rash.  Allergic/Immunologic: Negative for environmental allergies.  Neurological: Negative for dizziness, syncope and headaches.  Hematological: Negative for  adenopathy. Does not bruise/bleed easily.  Psychiatric/Behavioral: Negative for decreased concentration and dysphoric mood. The patient is not nervous/anxious.        Objective:   Physical Exam Constitutional:      General: She is not in acute distress.    Appearance: Normal appearance. She is well-developed. She is not ill-appearing.  HENT:     Head: Normocephalic and atraumatic.  Eyes:     General: No scleral icterus.    Conjunctiva/sclera: Conjunctivae normal.     Pupils: Pupils are equal, round, and reactive to light.  Neck:     Musculoskeletal: Normal range of motion and neck supple.  Cardiovascular:     Rate and Rhythm: Normal rate and regular rhythm.  Pulmonary:     Effort: Pulmonary effort is normal.     Breath sounds: Normal breath sounds. No wheezing or rales.  Abdominal:     General: Bowel sounds are normal. There is no distension.     Palpations: Abdomen is soft.     Tenderness: There is no abdominal tenderness.  Musculoskeletal:        General: Tenderness present.     Left knee: She exhibits decreased range of motion and effusion. She exhibits no deformity, no erythema, normal alignment, normal patellar mobility and no bony tenderness. Tenderness found. Medial joint line tenderness noted.     Lumbar back: She exhibits decreased range of motion, tenderness and spasm. She exhibits no bony tenderness and no edema.     Comments: Knee -Left  No warmth to the touch or erythema No crepitus  ROM: pain with 100 deg flex Ext -some pain on full mcmurray-normal Bounce test -normal  Stability:  Anterior drawer-nl Lachman exam -nl end point but unfomfortable  Tenderness over medial joint line and patellar tendon Mild effusion noted  Gait -favors R leg slightly    Lymphadenopathy:     Cervical: No cervical adenopathy.  Skin:    General: Skin is warm and dry.     Coloration: Skin is not pale.     Findings: No erythema or rash.  Neurological:     Mental Status:  She is alert.     Cranial Nerves: No cranial nerve deficit.     Sensory: No sensory deficit.     Motor: No atrophy or abnormal muscle tone.     Coordination: Coordination normal.     Deep Tendon Reflexes: Reflexes are normal and symmetric. Reflexes normal.     Comments: Negative SLR  Psychiatric:        Mood and Affect: Mood normal.           Assessment & Plan:   Problem List Items Addressed This Visit      Other   Left knee pain    S/p overuse/boot  camp and also remote hx of fall Some medial tenderness as well as small effusion  Suspect injury  Xray today  Ice/elevation  Use knee sleeve with activity and relative rest from overuse  Aleve prn  If no significant improvement with 2 weeks- inst to call       Relevant Orders   DG Knee 4 Views W/Patella Left (Completed)

## 2018-10-03 NOTE — Patient Instructions (Signed)
I think you have an overuse knee injury  Avoid squats/lunges and jumping for now  Walking is ok if it does not hurt  Try a neoprene knee sleeve with a hole for the kneecap when active and walking   Continue aleve twice daily with a meal for 2-4 more weeks Ice/elevation when you can   A bike or exercise bike is also helpful (strengthens surrounding knee structures)   Xray now  We will contact you with a result

## 2018-10-05 NOTE — Assessment & Plan Note (Signed)
S/p overuse/boot camp and also remote hx of fall Some medial tenderness as well as small effusion  Suspect injury  Xray today  Ice/elevation  Use knee sleeve with activity and relative rest from overuse  Aleve prn  If no significant improvement with 2 weeks- inst to call

## 2018-12-24 ENCOUNTER — Telehealth: Payer: Self-pay

## 2018-12-24 NOTE — Telephone Encounter (Signed)
Pt started lifting weights and working out 1 month ago. On 12/20/18 pt began with SOB upon exertion or resting. Pt has dull CP with heaviness in chest and also some pain in rt side of neck on and off. No H/A,dizziness,weakness in extremities, vision changes. Pt has no covid symptoms except SOB, no travel and no known exposure to + covid. Pt will go to [facility] for eval. Pt denied 911 said she does  Not feel that bad;now no chest pain but feels like she cannot get her breath. FYI to Dr Glori Bickers.

## 2018-12-24 NOTE — Telephone Encounter (Signed)
Aware, I will watch for records

## 2018-12-25 ENCOUNTER — Encounter: Payer: Self-pay | Admitting: Gynecology

## 2018-12-27 ENCOUNTER — Other Ambulatory Visit: Payer: Self-pay | Admitting: Internal Medicine

## 2018-12-27 DIAGNOSIS — Z20822 Contact with and (suspected) exposure to covid-19: Secondary | ICD-10-CM

## 2018-12-28 ENCOUNTER — Encounter: Payer: Self-pay | Admitting: Family Medicine

## 2018-12-28 LAB — NOVEL CORONAVIRUS, NAA: SARS-CoV-2, NAA: NOT DETECTED

## 2018-12-30 ENCOUNTER — Encounter: Payer: Self-pay | Admitting: Family Medicine

## 2018-12-31 NOTE — Telephone Encounter (Signed)
Called pt and she said she thinks the chest pressure came from her lifting weights and was just a little sore afterwards. Pt said the chest pressure is gone it's mainly the SOB, and feeling like something is stuck in her throat. Pt said she does have some left ankle swelling. Pt scheduled an appt tomorrow at 4:15 to eval sxs. I advise her of Dr. Marliss Coots comments and she has no sxs of covid and test was neg. Pt advise if any new sxs or if sxs worsen before her appt to call back asap and if sxs are severe to go to ER, pt verbalized understanding

## 2018-12-31 NOTE — Telephone Encounter (Signed)
Called pt to get UC info, pt went to [facility] in [place], I did call and request records from them will update once I receive them  While talking to pt she let me know that she still is having breathing issues. She said that she feels like anytime she does anything she has a hard time catching her breath. Also when she isn't even doing anything if feels like something is stuck in her throat so she is always trying to clear her throat but nothing is coming up. I asked pt had she choked on any food recently that she is aware of and she said no. She said the UC tried to tell her that she has exercise induced asthma but pt said sometimes she isn't even doing anything and it's hard to catch her breath so she doesn't agree with their diagnosis

## 2018-12-31 NOTE — Telephone Encounter (Signed)
Dr. Glori Bickers sent me a message saying:  "I reviewed her note and it mentioned chest pressure - does she also have this with exertion? Any leg /ankle swelling  I want to make sure this is not cardiac in nature before sending her to a pulmonary specialist  Covid test is negative -re assuring  As long as she does not have any covid symptoms (fever/cough/GI/loss of taste or smell) I think it is safe to have her come in for a visit in office (I would like to get EKG and possibly cxr)  Thanks"

## 2018-12-31 NOTE — Telephone Encounter (Signed)
See prv note. Records received and placed in your inbox

## 2019-01-01 ENCOUNTER — Other Ambulatory Visit: Payer: Self-pay

## 2019-01-01 ENCOUNTER — Ambulatory Visit (INDEPENDENT_AMBULATORY_CARE_PROVIDER_SITE_OTHER)
Admission: RE | Admit: 2019-01-01 | Discharge: 2019-01-01 | Disposition: A | Discharge status: Home / Self Care | Payer: Managed Care, Other (non HMO) | Source: Ambulatory Visit | Attending: Family Medicine | Admitting: Family Medicine

## 2019-01-01 ENCOUNTER — Ambulatory Visit: Payer: Managed Care, Other (non HMO) | Admitting: Family Medicine

## 2019-01-01 ENCOUNTER — Encounter: Payer: Self-pay | Admitting: Family Medicine

## 2019-01-01 VITALS — BP 112/84 | HR 78 | Temp 98.0°F | Ht 65.5 in | Wt 190.0 lb

## 2019-01-01 DIAGNOSIS — R0609 Other forms of dyspnea: Secondary | ICD-10-CM | POA: Insufficient documentation

## 2019-01-01 DIAGNOSIS — F430 Acute stress reaction: Secondary | ICD-10-CM

## 2019-01-01 DIAGNOSIS — S0300XS Dislocation of jaw, unspecified side, sequela: Secondary | ICD-10-CM

## 2019-01-01 DIAGNOSIS — R0600 Dyspnea, unspecified: Secondary | ICD-10-CM | POA: Insufficient documentation

## 2019-01-01 DIAGNOSIS — R0989 Other specified symptoms and signs involving the circulatory and respiratory systems: Secondary | ICD-10-CM | POA: Insufficient documentation

## 2019-01-01 DIAGNOSIS — R198 Other specified symptoms and signs involving the digestive system and abdomen: Secondary | ICD-10-CM | POA: Insufficient documentation

## 2019-01-01 MED ORDER — FAMOTIDINE 40 MG PO TABS: 40.0000 mg | ORAL_TABLET | Freq: Every day | ORAL | 3 refills | Status: DC

## 2019-01-01 NOTE — Patient Instructions (Addendum)
Think about counseling/therapy for stress   Keep exercising-build up more slowly   Chest xray today   ekg looks good  Exam is good   Start pepcid once daily for acid reflux   For TMJ try to eat soft foods for a while -be mindful of clenching and grinding teeth

## 2019-01-01 NOTE — Assessment & Plan Note (Signed)
TMJ pain  Suspect pt is grinding/clenching teeth  Recommend soft foods for several days  May need to check in with dentist about a bite guard as well

## 2019-01-01 NOTE — Progress Notes (Signed)
Subjective:    Patient ID: Peggy Hicks, female    DOB: 1965/06/26, 53 y.o.   MRN: [id number]  HPI Pt presents with sob on exertion   She noticed this about  2 weeks ago when she started exercising again  Not getting better  Also some pain on R side of her face   She started exercise with running - about 2.5 miles  In about 15-20 minutes (in ams-not hot)  Can run 5 minutes and then walks to catch her breath and then walks again  Had been since march  Felt like she could not get a deep enough breath  Did not feel obstructed  Thinks she does hear a little wheezing at night   No cough   Chest was a little sore-but thinks that was from bench pressing- that is better now   No clamminess or nausea  No dizziness   R face is sore today around her ear  Hurts to open mouth wide Some sinus pressure on and off in forehead   No congestion or sneezing  Does feel like there is something in her throat-cannot clear I  No heartburn or indigestion   Wt Readings from Last 3 Encounters:  01/01/19 190 lb (86.2 kg)  10/03/18 184 lb 7 oz (83.7 kg)  06/04/18 180 lb 4.8 oz (81.8 kg)  wt is up  Has never weighed this much  Picked up weight after surgery  31.14 kg/m   Stress level is high  Work - from [employer] 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) on 9/22 Diagnosed with possible exercise induced asthma and px albuterol mdi  Inhaler has not helped  covid 19 test came back negative   EKG today shows NSR with rate of 75 and possible L atrial enlargement  No significant change from last EKG in 2017   Patient Active Problem List   Diagnosis Date Noted  . Dyspnea on exertion 01/01/2019  . Globus sensation 01/01/2019  . Left knee pain 10/03/2018  . Leiomyoma 06/04/2018  . Daily headache 06/29/2017  . Hx of adenomatous colonic polyps 04/14/2014  . Left lumbar  radiculopathy 12/10/2012  . Routine general medical examination at a health care facility 01/01/2012  . Stress reaction 11/13/2011  . Prediabetes 11/15/2010  . PRIMARY FOCAL HYPERHIDROSIS 10/22/2007  . TMJ (dislocation of temporomandibular joint) 01/25/2007  . Situational anxiety 08/01/2006   Past Medical History:  Diagnosis Date  . Fibroids   . Hx of adenomatous colonic polyps 04/14/2014  . Hyperhidrosis   . Stress reaction    Past Surgical History:  Procedure Laterality Date  . BREAST BIOPSY    . BREAST SURGERY     Breast Reduction  . CESAREAN SECTION      X 2  . HYSTERECTOMY ABDOMINAL WITH SALPINGECTOMY Bilateral 06/04/2018   Procedure: HYSTERECTOMY ABDOMINAL WITH SALPINGECTOMY;  Surgeon: Anastasio Auerbach, MD;  Location: [facility];  Service: Gynecology;  Laterality: Bilateral;  . HYSTEROSCOPY    . MYOMECTOMY     Uterine fibroids removed/ hysteroscopic submucous myomectomy  . TUBAL LIGATION     Social History   Tobacco Use  . Smoking status: Never Smoker  . Smokeless tobacco: Never Used  Substance Use Topics  . Alcohol use: Yes    Alcohol/week: 0.0 standard drinks    Comment: Rare  .  Drug use: No   Family History  Problem Relation Age of Onset  . Hypertension Mother   . Heart disease Mother   . Diabetes Mother   . Hypertension Father   . Heart disease Father   . Cancer Maternal Aunt        liver  . Colon cancer Neg Hx   . Esophageal cancer Neg Hx   . Rectal cancer Neg Hx   . Stomach cancer Neg Hx    No Known Allergies Current Outpatient Medications on File Prior to Visit  Medication Sig Dispense Refill  . ALPRAZolam (XANAX) 0.5 MG tablet Take 1 tablet (0.5 mg total) by mouth daily as needed for anxiety. For traveling 30 tablet 0  . cetirizine (ZYRTEC) 10 MG tablet Take 10 mg by mouth daily as needed for allergies.    Marland Kitchen ELDERBERRY PO Take by mouth.    . fluticasone (FLONASE) 50 MCG/ACT nasal spray Place 1 spray into both nostrils daily as  needed for allergies or rhinitis.    . Lactobacillus (DIGESTIVE HEALTH PROBIOTIC) CAPS Take 1 capsule by mouth daily.    . naproxen sodium (ALEVE) 220 MG tablet Take 440 mg by mouth daily as needed (pain).    . Vitamin D, Cholecalciferol, 25 MCG (1000 UT) CAPS Take by mouth.     No current facility-administered medications on file prior to visit.      Review of Systems  Constitutional: Positive for fatigue. Negative for activity change, appetite change, fever and unexpected weight change.  HENT: Negative for congestion, ear pain, rhinorrhea, sinus pressure and sore throat.   Eyes: Negative for pain, discharge, redness and visual disturbance.  Respiratory: Positive for shortness of breath. Negative for apnea, cough, choking, chest tightness, wheezing and stridor.        Globus sensation in throat  Cardiovascular: Negative for chest pain and palpitations.       Occ ankles swell a little- esp L one  Gastrointestinal: Negative for abdominal pain, blood in stool, constipation, diarrhea, nausea and vomiting.       No heartburn  Endocrine: Negative for polydipsia and polyuria.  Genitourinary: Negative for dysuria, frequency and urgency.  Musculoskeletal: Negative for arthralgias, back pain and myalgias.  Skin: Negative for pallor and rash.  Allergic/Immunologic: Negative for environmental allergies.  Neurological: Negative for dizziness, syncope, weakness, light-headedness, numbness and headaches.  Hematological: Negative for adenopathy. Does not bruise/bleed easily.  Psychiatric/Behavioral: Positive for dysphoric mood. Negative for decreased concentration. The patient is nervous/anxious.        Objective:   Physical Exam Constitutional:      General: She is not in acute distress.    Appearance: Normal appearance. She is well-developed. She is obese. She is not ill-appearing or diaphoretic.  HENT:     Head: Normocephalic and atraumatic.     Right Ear: Tympanic membrane normal.     Left  Ear: Tympanic membrane and ear canal normal.     Nose: Nose normal. No congestion.     Comments: Boggy nares    Mouth/Throat:     Mouth: Mucous membranes are moist.     Pharynx: Oropharynx is clear. No posterior oropharyngeal erythema.     Comments: No pnd Eyes:     General: No scleral icterus.       Right eye: No discharge.        Left eye: No discharge.     Conjunctiva/sclera: Conjunctivae normal.     Pupils: Pupils are equal, round, and reactive to light.  Neck:     Musculoskeletal: Normal range of motion and neck supple. No neck rigidity or muscular tenderness.     Thyroid: No thyromegaly.     Vascular: No carotid bruit or JVD.  Cardiovascular:     Rate and Rhythm: Normal rate and regular rhythm.     Pulses: Normal pulses.     Heart sounds: Normal heart sounds. No murmur. No gallop.   Pulmonary:     Effort: Pulmonary effort is normal. No respiratory distress.     Breath sounds: Normal breath sounds. No stridor. No wheezing, rhonchi or rales.     Comments: Good air exch Chest:     Chest wall: No tenderness.  Abdominal:     General: Bowel sounds are normal. There is no distension or abdominal bruit.     Palpations: Abdomen is soft. There is no mass.     Tenderness: There is no abdominal tenderness.     Hernia: No hernia is present.  Musculoskeletal:        General: No tenderness.     Right lower leg: No edema.     Left lower leg: No edema.  Lymphadenopathy:     Cervical: No cervical adenopathy.  Skin:    General: Skin is warm and dry.     Coloration: Skin is not pale.     Findings: No erythema or rash.  Neurological:     Mental Status: She is alert.     Coordination: Coordination normal.     Gait: Gait normal.     Deep Tendon Reflexes: Reflexes are normal and symmetric. Reflexes normal.  Psychiatric:        Mood and Affect: Affect is blunt.        Speech: Speech normal.        Behavior: Behavior normal.           Assessment & Plan:   Problem List Items  Addressed This Visit      Musculoskeletal and Integument   TMJ (dislocation of temporomandibular joint)    TMJ pain  Suspect pt is grinding/clenching teeth  Recommend soft foods for several days  May need to check in with dentist about a bite guard as well        Other   Stress reaction    I do wonder if this is adding to feeling of sob/globus sensation?  Also jaw clenching Offered counseling referral and she will consider it Stressors are real Reviewed stressors/ coping techniques/symptoms/ support sources/ tx options and side effects in detail today       Dyspnea on exertion - Primary    Reviewed urgent care notes in detail  EKG is w/o change  cxr today and plan to follow Nl exam/vitals and pulse ox on RA  Per pt no imp with albuterol -highly doubt this is obstructive lung disease  Disc poss of stress reaction playing role along with need to build conditioning Also? Acid reflux (in light of globus sensation)  Will try a course of pepcid to see if this helps Will follow closely inst to go to ED if sudden worsening      Relevant Orders   DG Chest 2 View   Globus sensation    With feeling of difficulty getting a deep breath ? If possible acid reflux  Will try a course of pepcid and update Also cxr today       Other Visit Diagnoses    Dyspnea, unspecified type       Relevant  Orders   EKG 12-Lead (Completed)

## 2019-01-01 NOTE — Assessment & Plan Note (Signed)
With feeling of difficulty getting a deep breath ? If possible acid reflux  Will try a course of pepcid and update Also cxr today

## 2019-01-01 NOTE — Assessment & Plan Note (Signed)
I do wonder if this is adding to feeling of sob/globus sensation?  Also jaw clenching Offered counseling referral and she will consider it Stressors are real Reviewed stressors/ coping techniques/symptoms/ support sources/ tx options and side effects in detail today

## 2019-01-01 NOTE — Assessment & Plan Note (Signed)
Reviewed urgent care notes in detail  EKG is w/o change  cxr today and plan to follow Nl exam/vitals and pulse ox on RA  Per pt no imp with albuterol -highly doubt this is obstructive lung disease  Disc poss of stress reaction playing role along with need to build conditioning Also? Acid reflux (in light of globus sensation)  Will try a course of pepcid to see if this helps Will follow closely inst to go to ED if sudden worsening

## 2019-01-20 ENCOUNTER — Encounter: Payer: Self-pay | Admitting: Cardiology

## 2019-01-20 ENCOUNTER — Telehealth: Payer: Self-pay | Admitting: *Deleted

## 2019-01-20 ENCOUNTER — Ambulatory Visit (INDEPENDENT_AMBULATORY_CARE_PROVIDER_SITE_OTHER): Payer: Managed Care, Other (non HMO) | Admitting: Cardiology

## 2019-01-20 ENCOUNTER — Other Ambulatory Visit: Payer: Self-pay

## 2019-01-20 VITALS — BP 120/90 | HR 59 | Ht 65.0 in | Wt 187.8 lb

## 2019-01-20 DIAGNOSIS — R0602 Shortness of breath: Secondary | ICD-10-CM | POA: Diagnosis not present

## 2019-01-20 DIAGNOSIS — R0600 Dyspnea, unspecified: Secondary | ICD-10-CM

## 2019-01-20 DIAGNOSIS — R0609 Other forms of dyspnea: Secondary | ICD-10-CM

## 2019-01-20 DIAGNOSIS — R0789 Other chest pain: Secondary | ICD-10-CM

## 2019-01-20 NOTE — Telephone Encounter (Signed)
I would like to go ahead and refer her to cardiology if she is open to it (for further eval/they may recommend stress test)   Let me know if she is ok with that

## 2019-01-20 NOTE — Telephone Encounter (Signed)
Patient called stating that over the weekend she had some SOB, chest pain, some left jaw pain and left arm pain. Patient stated that she has had some headaches also. Patient stated that she was given pepcid the last time she was in and it worked for a short while, but does not feel that it is working now. Patient stated that she knows something is not right and wants to know what other test can be done? Patient stated that she is not having any symptoms at this time. ER precautions given to patient.

## 2019-01-20 NOTE — Progress Notes (Signed)
Cardiology Office Note:    Date:  01/20/2019   ID:  [name], [name] 08-16-1965, MRN [id number]  PCP:  Abner Greenspan, MD  Cardiologist:  Kate Sable, MD  Electrophysiologist:  None   Referring MD: Abner Greenspan, MD   Chief Complaint  Patient presents with  . New Patient (Initial Visit)    referred by PCP for chest discomfort. Patient c/o SOB and chest pain/discomfort. Meds reviewed verbally with patient.     History of Present Illness:    Peggy Hicks is a 53 y.o. female with a hx of fibroids, who presents due to dyspnea on exertion.  Patient states having shortness of breath during her [facility] exercises.  She used to do these exercises without any problems.  Shortness of breath is associated with some chest pressure.  Symptoms improve when she rests.  She states working from home in [employer] and sometimes notices shortness of breath with rest.  She denies palpitations, edema, presyncope, syncope.  She also noticed some jaw pain and radiation down her left arm.  She was worried about this which prompted her to call her primary care physician's office.  Patient was then recommended to come see cardiology.  She denies any history of smoking, denies any family history of cardiac disease.   Past Medical History:  Diagnosis Date  . Fibroids   . Hx of adenomatous colonic polyps 04/14/2014  . Hyperhidrosis   . Stress reaction     Past Surgical History:  Procedure Laterality Date  . BREAST BIOPSY    . BREAST SURGERY     Breast Reduction  . CESAREAN SECTION      X 2  . HYSTERECTOMY ABDOMINAL WITH SALPINGECTOMY Bilateral 06/04/2018   Procedure: HYSTERECTOMY ABDOMINAL WITH SALPINGECTOMY;  Surgeon: Anastasio Auerbach, MD;  Location: [place];  Service: Gynecology;  Laterality: Bilateral;  . HYSTEROSCOPY    . MYOMECTOMY     Uterine fibroids removed/ hysteroscopic submucous myomectomy  . TUBAL LIGATION      Current Medications: Current Meds   Medication Sig  . ALPRAZolam (XANAX) 0.5 MG tablet Take 1 tablet (0.5 mg total) by mouth daily as needed for anxiety. For traveling  . cetirizine (ZYRTEC) 10 MG tablet Take 10 mg by mouth daily as needed for allergies.  Marland Kitchen ELDERBERRY PO Take by mouth.  . famotidine (PEPCID) 40 MG tablet Take 1 tablet (40 mg total) by mouth daily.  . fluticasone (FLONASE) 50 MCG/ACT nasal spray Place 1 spray into both nostrils daily as needed for allergies or rhinitis.  . Lactobacillus (DIGESTIVE HEALTH PROBIOTIC) CAPS Take 1 capsule by mouth daily.  . naproxen sodium (ALEVE) 220 MG tablet Take 440 mg by mouth daily as needed (pain).  . Vitamin D, Cholecalciferol, 25 MCG (1000 UT) CAPS Take by mouth.     Allergies:   Patient has no known allergies.   Social History   Socioeconomic History  . Marital status: Married    Spouse name: Not on file  . Number of children: Not on file  . Years of education: Not on file  . Highest education level: Not on file  Occupational History  . Not on file  Social Needs  . Financial resource strain: Not on file  . Food insecurity    Worry: Not on file    Inability: Not on file  . Transportation needs    Medical: Not on file    Non-medical: Not on file  Tobacco Use  .  Smoking status: Never Smoker  . Smokeless tobacco: Never Used  Substance and Sexual Activity  . Alcohol use: Yes    Alcohol/week: 0.0 standard drinks    Comment: Rare  . Drug use: No  . Sexual activity: Yes    Birth control/protection: Surgical    Comment: Tubal lig-1st intercourse 53 yo--Fewer than 5 partners  Lifestyle  . Physical activity    Days per week: Not on file    Minutes per session: Not on file  . Stress: Not on file  Relationships  . Social Herbalist on phone: Not on file    Gets together: Not on file    Attends religious service: Not on file    Active member of club or organization: Not on file    Attends meetings of clubs or organizations: Not on file     Relationship status: Not on file  Other Topics Concern  . Not on file  Social History Narrative   Exercises regularly     Family History: The patient's family history includes Cancer in her maternal aunt; Diabetes in her mother; Heart disease in her father and mother; Hypertension in her father and mother. There is no history of Colon cancer, Esophageal cancer, Rectal cancer, or Stomach cancer.  ROS:   Please see the history of present illness.     All other systems reviewed and are negative.  EKGs/Labs/Other Studies Reviewed:    The following studies were reviewed today:   EKG:  EKG is  ordered today.  The ekg ordered today demonstrates sinus bradycardia, heart rate 59, possible left atrial enlargement.  Recent Labs: 05/03/2018: TSH 2.910 05/29/2018: ALT 16; BUN 15; Creatinine, Ser 0.80; Potassium 3.9; Sodium 139 06/05/2018: Hemoglobin 11.8; Platelets 252  Recent Lipid Panel    Component Value Date/Time   CHOL 208 (H) 05/03/2018 0837   TRIG 89 05/03/2018 0837   HDL 84 05/03/2018 0837   CHOLHDL 2.5 05/03/2018 0837   CHOLHDL 2.9 05/21/2012 1432   VLDL 27 05/21/2012 1432   LDLCALC 106 (H) 05/03/2018 0837    Physical Exam:    VS:  BP 120/90 (BP Location: Right Arm, Patient Position: Sitting, Cuff Size: Normal)   Pulse (!) 59   Ht 5\' 5"  (1.651 m)   Wt 187 lb 12 oz (85.2 kg)   LMP 03/03/2018 (Approximate)   BMI 31.24 kg/m     Wt Readings from Last 3 Encounters:  01/20/19 187 lb 12 oz (85.2 kg)  01/01/19 190 lb (86.2 kg)  10/03/18 184 lb 7 oz (83.7 kg)     GEN:  Well nourished, well developed in no acute distress HEENT: Normal NECK: No JVD; No carotid bruits LYMPHATICS: No lymphadenopathy CARDIAC: RRR, no murmurs, rubs, gallops RESPIRATORY:  Clear to auscultation without rales, wheezing or rhonchi  ABDOMEN: Soft, non-tender, non-distended MUSCULOSKELETAL:  No edema; No deformity  SKIN: Warm and dry NEUROLOGIC:  Alert and oriented x 3 PSYCHIATRIC:  Normal affect    ASSESSMENT:   Patient states having dyspnea on exertion.  During my exam she looks anxious and worried.  She has no risk factors for cardiac disease.  She is low to intermediate risk. 1. DOE (dyspnea on exertion)   2. Shortness of breath    PLAN:    In order of problems listed above:  We will get a Lexiscan myocardial perfusion imaging stress test.  Get echocardiogram.  Follow-up after blood test.    Total encounter time more than 45 minutes  Greater  than 50% was spent in counseling and coordination of care with the patient  This note was generated in part or whole with voice recognition software. Voice recognition is usually quite accurate but there are transcription errors that can and very often do occur. I apologize for any typographical errors that were not detected and corrected.  Medication Adjustments/Labs and Tests Ordered: Current medicines are reviewed at length with the patient today.  Concerns regarding medicines are outlined above.  Orders Placed This Encounter  Procedures  . NM Myocar Multi W/Spect W/Wall Motion / EF  . EKG 12-Lead  . ECHOCARDIOGRAM COMPLETE   No orders of the defined types were placed in this encounter.   Patient Instructions  Medication Instructions:  Your physician recommends that you continue on your current medications as directed. Please refer to the Current Medication list given to you today.  *If you need a refill on your cardiac medications before your next appointment, please call your pharmacy*  Lab Work: NONE If you have labs (blood work) drawn today and your tests are completely normal, you will receive your results only by: Marland Kitchen MyChart Message (if you have MyChart) OR . A paper copy in the mail If you have any lab test that is abnormal or we need to change your treatment, we will call you to review the results.  Testing/Procedures: 1) Your physician has requested that you have an echocardiogram. Echocardiography is a painless test  that uses sound waves to create images of your heart. It provides your doctor with information about the size and shape of your heart and how well your heart's chambers and valves are working. This procedure takes approximately one hour. There are no restrictions for this procedure. You may get an IV, if needed, to receive an ultrasound enhancing agent through to better visualize your heart.    2) Your physician has requested that you have a lexiscan myoview. For further information please visit [email or web address]. Please follow instruction sheet, as given.  [place]  Your caregiver has ordered a Stress Test with nuclear imaging. The purpose of this test is to evaluate the blood supply to your heart muscle. This procedure is referred to as a "Non-Invasive Stress Test." This is because other than having an IV started in your vein, nothing is inserted or "invades" your body. Cardiac stress tests are done to find areas of poor blood flow to the heart by determining the extent of coronary artery disease (CAD). Some patients exercise on a treadmill, which naturally increases the blood flow to your heart, while others who are  unable to walk on a treadmill due to physical limitations have a pharmacologic/chemical stress agent called Lexiscan . This medicine will mimic walking on a treadmill by temporarily increasing your coronary blood flow.   Please note: these test may take anywhere between 2-4 hours to complete  PLEASE REPORT TO [facility] AT THE FIRST DESK WILL DIRECT YOU WHERE TO GO  Date of Procedure:_____________________________________  Arrival Time for Procedure:______________________________    PLEASE NOTIFY THE OFFICE AT LEAST 24 HOURS IN ADVANCE IF YOU ARE UNABLE TO KEEP YOUR APPOINTMENT.  [phone] AND  PLEASE NOTIFY NUCLEAR MEDICINE AT [facility] AT LEAST 24 HOURS IN ADVANCE IF YOU ARE UNABLE TO KEEP YOUR APPOINTMENT. [phone]  How to prepare for  your Myoview test:  1. Do not eat or drink after midnight 2. No caffeine for 24 hours prior to test 3. No smoking 24 hours prior to test.  4. Your medication may be taken with water.  If your doctor stopped a medication because of this test, do not take that medication. 5. Ladies, please do not wear dresses.  Skirts or pants are appropriate. Please wear a short sleeve shirt. 6. No perfume, cologne or lotion. 7. Wear comfortable walking shoes. No heels!   Follow-Up: At [facility], you and your health needs are our priority.  As part of our continuing mission to provide you with exceptional heart care, we have created designated Provider Care Teams.  These Care Teams include your primary Cardiologist (physician) and Advanced Practice Providers (APPs -  Physician Assistants and Nurse Practitioners) who all work together to provide you with the care you need, when you need it.  Your next appointment:   about 1 month after testing complete.  The format for your next appointment:   In Person  Provider:   Dr Garen Lah       Signed, Kate Sable, MD  01/20/2019 4:09 PM    [place]

## 2019-01-20 NOTE — Telephone Encounter (Signed)
Pt notified of Dr. Marliss Coots comments and recommendations. Pt does agree with referral, I advise her our [facility] will call to schedule appt

## 2019-01-20 NOTE — Patient Instructions (Signed)
Medication Instructions:  Your physician recommends that you continue on your current medications as directed. Please refer to the Current Medication list given to you today.  *If you need a refill on your cardiac medications before your next appointment, please call your pharmacy*  Lab Work: NONE If you have labs (blood work) drawn today and your tests are completely normal, you will receive your results only by: Marland Kitchen MyChart Message (if you have MyChart) OR . A paper copy in the mail If you have any lab test that is abnormal or we need to change your treatment, we will call you to review the results.  Testing/Procedures: 1) Your physician has requested that you have an echocardiogram. Echocardiography is a painless test that uses sound waves to create images of your heart. It provides your doctor with information about the size and shape of your heart and how well your heart's chambers and valves are working. This procedure takes approximately one hour. There are no restrictions for this procedure. You may get an IV, if needed, to receive an ultrasound enhancing agent through to better visualize your heart.    2) Your physician has requested that you have a lexiscan myoview. For further information please visit [email or web address]. Please follow instruction sheet, as given.  [place]  Your caregiver has ordered a Stress Test with nuclear imaging. The purpose of this test is to evaluate the blood supply to your heart muscle. This procedure is referred to as a "Non-Invasive Stress Test." This is because other than having an IV started in your vein, nothing is inserted or "invades" your body. Cardiac stress tests are done to find areas of poor blood flow to the heart by determining the extent of coronary artery disease (CAD). Some patients exercise on a treadmill, which naturally increases the blood flow to your heart, while others who are  unable to walk on a treadmill due to physical  limitations have a pharmacologic/chemical stress agent called Lexiscan . This medicine will mimic walking on a treadmill by temporarily increasing your coronary blood flow.   Please note: these test may take anywhere between 2-4 hours to complete  PLEASE REPORT TO [facility] AT THE FIRST DESK WILL DIRECT YOU WHERE TO GO  Date of Procedure:_____________________________________  Arrival Time for Procedure:______________________________    PLEASE NOTIFY THE OFFICE AT LEAST 24 HOURS IN ADVANCE IF YOU ARE UNABLE TO KEEP YOUR APPOINTMENT.  [phone] AND  PLEASE NOTIFY NUCLEAR MEDICINE AT [facility] AT LEAST 24 HOURS IN ADVANCE IF YOU ARE UNABLE TO KEEP YOUR APPOINTMENT. [phone]  How to prepare for your Myoview test:  1. Do not eat or drink after midnight 2. No caffeine for 24 hours prior to test 3. No smoking 24 hours prior to test. 4. Your medication may be taken with water.  If your doctor stopped a medication because of this test, do not take that medication. 5. Ladies, please do not wear dresses.  Skirts or pants are appropriate. Please wear a short sleeve shirt. 6. No perfume, cologne or lotion. 7. Wear comfortable walking shoes. No heels!   Follow-Up: At [facility], you and your health needs are our priority.  As part of our continuing mission to provide you with exceptional heart care, we have created designated Provider Care Teams.  These Care Teams include your primary Cardiologist (physician) and Advanced Practice Providers (APPs -  Physician Assistants and Nurse Practitioners) who all work together to provide you with the care you  need, when you need it.  Your next appointment:   about 1 month after testing complete.  The format for your next appointment:   In Person  Provider:   Dr Garen Lah

## 2019-02-03 ENCOUNTER — Encounter: Payer: Self-pay | Admitting: Family Medicine

## 2019-02-03 NOTE — Telephone Encounter (Signed)
Please sign and close encounter if completed.  

## 2019-02-03 NOTE — Telephone Encounter (Signed)
Pt saw cardiology on 01-20-2019 and is set up for testing.

## 2019-02-05 ENCOUNTER — Other Ambulatory Visit: Payer: Self-pay

## 2019-02-05 ENCOUNTER — Encounter
Admission: RE | Admit: 2019-02-05 | Discharge: 2019-02-05 | Disposition: A | Discharge status: Home / Self Care | Payer: Managed Care, Other (non HMO) | Source: Ambulatory Visit | Attending: Cardiology | Admitting: Cardiology

## 2019-02-05 DIAGNOSIS — R0602 Shortness of breath: Secondary | ICD-10-CM | POA: Diagnosis present

## 2019-02-05 DIAGNOSIS — R0600 Dyspnea, unspecified: Secondary | ICD-10-CM | POA: Diagnosis present

## 2019-02-05 DIAGNOSIS — R0609 Other forms of dyspnea: Secondary | ICD-10-CM

## 2019-02-05 LAB — NM MYOCAR MULTI W/SPECT W/WALL MOTION / EF
Estimated workload: 1 METS
Exercise duration (min): 0 min
Exercise duration (sec): 0 s
LV dias vol: 69 mL (ref 46–106)
LV sys vol: 29 mL
MPHR: 165 {beats}/min
Peak HR: 111 {beats}/min
Percent HR: 67 %
Rest HR: 61 {beats}/min
SDS: 0
SRS: 3
SSS: 2
TID: 1.06

## 2019-02-05 MED ORDER — TECHNETIUM TC 99M TETROFOSMIN IV KIT
31.6600 | PACK | Freq: Once | INTRAVENOUS | Status: AC | PRN
  Administered 2019-02-05: 31.66 via INTRAVENOUS

## 2019-02-05 MED ORDER — TECHNETIUM TC 99M TETROFOSMIN IV KIT
10.0000 | PACK | Freq: Once | INTRAVENOUS | Status: AC | PRN
  Administered 2019-02-05: 10.6 via INTRAVENOUS

## 2019-02-05 MED ORDER — REGADENOSON 0.4 MG/5ML IV SOLN
0.4000 mg | Freq: Once | INTRAVENOUS | Status: AC
  Administered 2019-02-05: 0.4 mg via INTRAVENOUS
  Filled 2019-02-05: qty 5

## 2019-02-26 ENCOUNTER — Other Ambulatory Visit: Payer: Self-pay

## 2019-02-26 ENCOUNTER — Ambulatory Visit (INDEPENDENT_AMBULATORY_CARE_PROVIDER_SITE_OTHER): Payer: Managed Care, Other (non HMO)

## 2019-02-26 DIAGNOSIS — R0602 Shortness of breath: Secondary | ICD-10-CM

## 2019-02-26 DIAGNOSIS — R0600 Dyspnea, unspecified: Secondary | ICD-10-CM

## 2019-02-26 DIAGNOSIS — R0609 Other forms of dyspnea: Secondary | ICD-10-CM

## 2019-03-03 ENCOUNTER — Ambulatory Visit (INDEPENDENT_AMBULATORY_CARE_PROVIDER_SITE_OTHER): Payer: Managed Care, Other (non HMO) | Admitting: Cardiology

## 2019-03-03 ENCOUNTER — Encounter: Payer: Self-pay | Admitting: Cardiology

## 2019-03-03 ENCOUNTER — Other Ambulatory Visit: Payer: Self-pay

## 2019-03-03 VITALS — BP 120/84 | HR 94 | Temp 97.1°F | Ht 65.75 in | Wt 189.5 lb

## 2019-03-03 DIAGNOSIS — I340 Nonrheumatic mitral (valve) insufficiency: Secondary | ICD-10-CM

## 2019-03-03 NOTE — Patient Instructions (Signed)
Medication Instructions:  Your physician recommends that you continue on your current medications as directed. Please refer to the Current Medication list given to you today.  *If you need a refill on your cardiac medications before your next appointment, please call your pharmacy*  Lab Work: None ordered If you have labs (blood work) drawn today and your tests are completely normal, you will receive your results only by: Marland Kitchen MyChart Message (if you have MyChart) OR . A paper copy in the mail If you have any lab test that is abnormal or we need to change your treatment, we will call you to review the results.  Testing/Procedures: Your physician has requested that you have an echocardiogram. Echocardiography is a painless test that uses sound waves to create images of your heart. It provides your doctor with information about the size and shape of your heart and how well your heart's chambers and valves are working. This procedure takes approximately one hour. There are no restrictions for this procedure. (To be performed in November 2021)  Follow-Up: At [facility], you and your health needs are our priority.  As part of our continuing mission to provide you with exceptional heart care, we have created designated Provider Care Teams.  These Care Teams include your primary Cardiologist (physician) and Advanced Practice Providers (APPs -  Physician Assistants and Nurse Practitioners) who all work together to provide you with the care you need, when you need it.  Your next appointment:   12 month(s) 1-2 weeks after the echo  The format for your next appointment:   In Person  Provider:    You may see Kate Sable, MD or one of the following Advanced Practice Providers on your designated Care Team:    Murray Hodgkins, NP  Christell Faith, PA-C  Marrianne Mood, PA-C   Other Instructions N/A

## 2019-03-03 NOTE — Progress Notes (Addendum)
Cardiology Office Note:    Date:  03/03/2019   ID:  Peggy, Hicks 04/10/1965, MRN [id number]  PCP:  Abner Greenspan, MD  Cardiologist:  Kate Sable, MD  Electrophysiologist:  None   Referring MD: Abner Greenspan, MD   Chief Complaint  Patient presents with  . office visit    F/U after echo; Meds verbally reviewed with patient.    History of Present Illness:    Peggy Hicks is a 53 y.o. female with a hx of fibroids, who presents for follow-up.  She was originally seen due to dyspnea on exertion.    Patient had shortness of breath during her [facility] exercises.  Shortness of breath is associated with some chest pressure.  Symptoms improve when she rests.  She states working from home in [employer] and sometimes notices shortness of breath with rest.  She denied palpitations, edema, presyncope, syncope.  She also noticed some jaw pain and radiation down her left arm.    Pharmacologic stress test and echocardiogram was ordered.  She now presents for results.  She states her symptoms have since improved somewhat and now alcohol when she lifts weights/bench pressing.  Past Medical History:  Diagnosis Date  . Fibroids   . Hx of adenomatous colonic polyps 04/14/2014  . Hyperhidrosis   . Stress reaction     Past Surgical History:  Procedure Laterality Date  . BREAST BIOPSY    . BREAST SURGERY     Breast Reduction  . CESAREAN SECTION      X 2  . HYSTERECTOMY ABDOMINAL WITH SALPINGECTOMY Bilateral 06/04/2018   Procedure: HYSTERECTOMY ABDOMINAL WITH SALPINGECTOMY;  Surgeon: Anastasio Auerbach, MD;  Location: [place];  Service: Gynecology;  Laterality: Bilateral;  . HYSTEROSCOPY    . MYOMECTOMY     Uterine fibroids removed/ hysteroscopic submucous myomectomy  . TUBAL LIGATION      Current Medications: Current Meds  Medication Sig  . ALPRAZolam (XANAX) 0.5 MG tablet Take 1 tablet (0.5 mg total) by mouth daily as needed for anxiety. For  traveling  . cetirizine (ZYRTEC) 10 MG tablet Take 10 mg by mouth daily as needed for allergies.  Marland Kitchen ELDERBERRY PO Take by mouth. Takes occassionally  . famotidine (PEPCID) 40 MG tablet Take 1 tablet (40 mg total) by mouth daily.  . fluticasone (FLONASE) 50 MCG/ACT nasal spray Place 1 spray into both nostrils daily as needed for allergies or rhinitis.  . Lactobacillus (DIGESTIVE HEALTH PROBIOTIC) CAPS Take 1 capsule by mouth daily.  . naproxen sodium (ALEVE) 220 MG tablet Take 440 mg by mouth daily as needed (pain).  . Vitamin D, Cholecalciferol, 25 MCG (1000 UT) CAPS Take by mouth. Takes occasionally     Allergies:   Patient has no known allergies.   Social History   Socioeconomic History  . Marital status: Married    Spouse name: Not on file  . Number of children: Not on file  . Years of education: Not on file  . Highest education level: Not on file  Occupational History  . Not on file  Social Needs  . Financial resource strain: Not on file  . Food insecurity    Worry: Not on file    Inability: Not on file  . Transportation needs    Medical: Not on file    Non-medical: Not on file  Tobacco Use  . Smoking status: Never Smoker  . Smokeless tobacco: Never Used  Substance and Sexual Activity  .  Alcohol use: Yes    Alcohol/week: 0.0 standard drinks    Comment: Rare  . Drug use: No  . Sexual activity: Yes    Birth control/protection: Surgical    Comment: Tubal lig-1st intercourse 53 yo--Fewer than 5 partners  Lifestyle  . Physical activity    Days per week: Not on file    Minutes per session: Not on file  . Stress: Not on file  Relationships  . Social Herbalist on phone: Not on file    Gets together: Not on file    Attends religious service: Not on file    Active member of club or organization: Not on file    Attends meetings of clubs or organizations: Not on file    Relationship status: Not on file  Other Topics Concern  . Not on file  Social History  Narrative   Exercises regularly     Family History: The patient's family history includes Cancer in her maternal aunt; Diabetes in her mother; Heart disease in her father and mother; Hypertension in her father and mother. There is no history of Colon cancer, Esophageal cancer, Rectal cancer, or Stomach cancer.  ROS:   Please see the history of present illness.     All other systems reviewed and are negative.  EKGs/Labs/Other Studies Reviewed:    The following studies were reviewed today: TTE Mar 13, 2019 1. Left ventricular ejection fraction, by visual estimation, is 55 to 60%. The left ventricle has normal function. There is mildly increased left ventricular hypertrophy.  2. The left ventricle has no regional wall motion abnormalities.  3. Global right ventricle has normal systolic function.The right ventricular size is normal. No increase in right ventricular wall thickness.  4. Left atrial size was normal.  5. Right atrial size was normal.  7. The mitral valve is myxomatous. Moderate mitral valve regurgitation. No evidence of mitral stenosis.  8. The tricuspid valve is normal in structure. Tricuspid valve regurgitation mild-moderate.  9. The aortic valve is tricuspid. Aortic valve regurgitation is not visualized. No evidence of aortic valve sclerosis or stenosis. 10. The pulmonic valve was not well visualized. Pulmonic valve regurgitation is trivial. 11. Normal pulmonary artery systolic pressure. 12. The inferior vena cava is normal in size with greater than 50% respiratory variability, suggesting right atrial pressure of 3 mmHg. 13. The interatrial septum was not well visualized.  Pharmacologic myocardial perfusion imaging stress test date 02/05/2019  The study is normal.  This is a low risk study.  The left ventricular ejection fraction is normal (55-65%).  No evidence for ischemia  Diapharagmatic attenuation  EKG:  EKG was not ordered today.  Recent Labs: 05/03/2018: TSH  2.910 05/29/2018: ALT 16; BUN 15; Creatinine, Ser 0.80; Potassium 3.9; Sodium 139 06/05/2018: Hemoglobin 11.8; Platelets 252  Recent Lipid Panel    Component Value Date/Time   CHOL 208 (H) 05/03/2018 0837   TRIG 89 05/03/2018 0837   HDL 84 05/03/2018 0837   CHOLHDL 2.5 05/03/2018 0837   CHOLHDL 2.9 05/21/2012 1432   VLDL 27 05/21/2012 1432   LDLCALC 106 (H) 05/03/2018 0837    Physical Exam:    VS:  BP 120/84 (BP Location: Left Arm, Patient Position: Sitting, Cuff Size: Normal)   Pulse 94   Temp (!) 97.1 F (36.2 C)   Ht 5' 5.75" (1.67 m)   Wt 189 lb 8 oz (86 kg)   LMP 03/03/2018 (Approximate)   SpO2 98%   BMI 30.82 kg/m  Wt Readings from Last 3 Encounters:  03/03/19 189 lb 8 oz (86 kg)  01/20/19 187 lb 12 oz (85.2 kg)  01/01/19 190 lb (86.2 kg)     GEN:  Well nourished, well developed in no acute distress HEENT: Normal NECK: No JVD; No carotid bruits LYMPHATICS: No lymphadenopathy CARDIAC: RRR, no murmurs, rubs, gallops RESPIRATORY:  Clear to auscultation without rales, wheezing or rhonchi  ABDOMEN: Soft, non-tender, non-distended MUSCULOSKELETAL:  No edema; No deformity  SKIN: Warm and dry NEUROLOGIC:  Alert and oriented x 3 PSYCHIATRIC:  Normal affect   ASSESSMENT:   Patient history of shortness of breath when lifting weights.  Pharmacologic stress test did not reveal any evidence of ischemia.  Echocardiogram showed normal systolic and diastolic function.  Mild to moderate MR was noted on echocardiogram. 1. Mitral valve insufficiency, unspecified etiology    PLAN:    Patient reassured on stress testing results.  Echocardiogram showed mild to moderate MR.  We will repeat echocardiogram in about 1 year.  Patient counseled on decreasing her weight capacity and monitor symptoms.  Follow-up in about 1 year.  Total encounter time more than 40 minutes  Greater than 50% was spent in counseling and coordination of care with the patient  This note was generated in  part or whole with voice recognition software. Voice recognition is usually quite accurate but there are transcription errors that can and very often do occur. I apologize for any typographical errors that were not detected and corrected.  Medication Adjustments/Labs and Tests Ordered: Current medicines are reviewed at length with the patient today.  Concerns regarding medicines are outlined above.  Orders Placed This Encounter  Procedures  . ECHOCARDIOGRAM COMPLETE   No orders of the defined types were placed in this encounter.   Patient Instructions  Medication Instructions:  Your physician recommends that you continue on your current medications as directed. Please refer to the Current Medication list given to you today.  *If you need a refill on your cardiac medications before your next appointment, please call your pharmacy*  Lab Work: None ordered If you have labs (blood work) drawn today and your tests are completely normal, you will receive your results only by: Marland Kitchen MyChart Message (if you have MyChart) OR . A paper copy in the mail If you have any lab test that is abnormal or we need to change your treatment, we will call you to review the results.  Testing/Procedures: Your physician has requested that you have an echocardiogram. Echocardiography is a painless test that uses sound waves to create images of your heart. It provides your doctor with information about the size and shape of your heart and how well your heart's chambers and valves are working. This procedure takes approximately one hour. There are no restrictions for this procedure. (To be performed in November 2021)  Follow-Up: At [facility], you and your health needs are our priority.  As part of our continuing mission to provide you with exceptional heart care, we have created designated Provider Care Teams.  These Care Teams include your primary Cardiologist (physician) and Advanced Practice Providers (APPs -   Physician Assistants and Nurse Practitioners) who all work together to provide you with the care you need, when you need it.  Your next appointment:   12 month(s) 1-2 weeks after the echo  The format for your next appointment:   In Person  Provider:    You may see Kate Sable, MD or one of the following Advanced  Practice Providers on your designated Care Team:    Murray Hodgkins, NP  Christell Faith, PA-C  Marrianne Mood, [place]   Other Instructions N/A     Signed, Kate Sable, MD  03/03/2019 3:33 PM    [place]

## 2019-03-05 ENCOUNTER — Telehealth: Payer: Self-pay | Admitting: Family Medicine

## 2019-03-05 DIAGNOSIS — Z86010 Personal history of colonic polyps: Secondary | ICD-10-CM

## 2019-03-05 NOTE — Telephone Encounter (Signed)
Referral done  Will send to [facility] 

## 2019-03-05 NOTE — Telephone Encounter (Signed)
Patient stated she is due for her colonoscopy  She would like to get this done at the [facility] office again.   Can you put in the referral for this?

## 2019-03-21 ENCOUNTER — Ambulatory Visit: Payer: Managed Care, Other (non HMO) | Admitting: Cardiology

## 2019-03-27 ENCOUNTER — Other Ambulatory Visit: Payer: Self-pay | Admitting: *Deleted

## 2019-03-27 MED ORDER — ALPRAZOLAM 0.5 MG PO TABS: 0.5000 mg | ORAL_TABLET | Freq: Every day | ORAL | 0 refills | Status: DC | PRN

## 2019-03-27 NOTE — Telephone Encounter (Signed)
Name of Medication: Xanax Name of Pharmacy: [facility] Revonda Humphrey or Written Date and Quantity: 09/21/17 #30 tabs with 0 refills Last Office Visit and Type: 01/01/19 Eval SOB Next Office Visit and Type: None scheduled

## 2019-04-23 ENCOUNTER — Encounter: Payer: Self-pay | Admitting: Family Medicine

## 2019-04-23 ENCOUNTER — Telehealth: Payer: Self-pay

## 2019-04-23 ENCOUNTER — Ambulatory Visit (INDEPENDENT_AMBULATORY_CARE_PROVIDER_SITE_OTHER): Payer: Managed Care, Other (non HMO) | Admitting: Family Medicine

## 2019-04-23 VITALS — BP 131/87 | HR 60 | Temp 97.6°F

## 2019-04-23 DIAGNOSIS — F411 Generalized anxiety disorder: Secondary | ICD-10-CM

## 2019-04-23 DIAGNOSIS — F430 Acute stress reaction: Secondary | ICD-10-CM | POA: Diagnosis not present

## 2019-04-23 DIAGNOSIS — R0989 Other specified symptoms and signs involving the circulatory and respiratory systems: Secondary | ICD-10-CM | POA: Diagnosis not present

## 2019-04-23 DIAGNOSIS — R198 Other specified symptoms and signs involving the digestive system and abdomen: Secondary | ICD-10-CM

## 2019-04-23 DIAGNOSIS — J3489 Other specified disorders of nose and nasal sinuses: Secondary | ICD-10-CM | POA: Insufficient documentation

## 2019-04-23 MED ORDER — BUSPIRONE HCL 15 MG PO TABS: 7.5000 mg | ORAL_TABLET | Freq: Two times a day (BID) | ORAL | 5 refills | Status: DC

## 2019-04-23 NOTE — Assessment & Plan Note (Signed)
Worsening general anx disorder-see a/p

## 2019-04-23 NOTE — Assessment & Plan Note (Signed)
Pt is noticing this more (with constant stressors)  This may be adding to physical symptoms and worry about health  Reviewed stressors/ coping techniques/symptoms/ support sources/ tx options and side effects in detail today  Ref made for counseling/ CBT Px buspar 7.5 mg bid  Discussed expectations of this medication including time to effectiveness and mechanism of action, also poss of side effects (early and late)- including mental fuzziness, weight or appetite change, nausea and poss of worse dep or anxiety (even suicidal thoughts)  Pt voiced understanding and will stop med and update if this occurs   If well tolerated pt will plan a virtual f/u in 1 month  Enc good self care/especially exercise

## 2019-04-23 NOTE — Assessment & Plan Note (Signed)
No improvement with allergy treatment  No headache or purulent nasal drainage Ref made to ENT for this and globus sensation

## 2019-04-23 NOTE — Telephone Encounter (Signed)
[facility] - Client TELEPHONE ADVICE RECORD [facility] Patient Name: Peggy Hicks Gender: Female DOB: Mar 19, 1966 Age: 54 Y 25 M 21 D Return Phone Number: [phone] (Primary), [phone] (Secondary) Address: City/State/Zip: [place] [address] Client [facility] - Client Client Site [place] Glori Bickers, Roque Lias - MD Contact Type Call Who Is Calling Patient / Member / Family / Caregiver Call Type Triage / Clinical Relationship To Patient Self Return Phone Number [phone] (Primary) Chief Complaint BREATHING - shortness of breath or sounds breathless Reason for Call Symptomatic / Request for [place] stated she feels like she is choked up, has tightness in her throat and feels short of breath. Translation No Nurse Assessment Nurse: Joline Salt, RN, Malachy Mood Date/Time Eilene Ghazi Time): 04/23/2019 2:08:56 PM Confirm and document reason for call. If symptomatic, describe symptoms. ---Caller states she feels like she is choked up, has tightness in her throat and feels short of breath. Her son has Covid and is on the latter part when she saw him on Saturday. She was outside and had her mask on. Has the patient had close contact with a person known or suspected to have the novel coronavirus illness OR traveled / lives in area with major community spread (including international travel) in the last 14 days from the onset of symptoms? * If Asymptomatic, screen for exposure and travel within the last 14 days. ---Yes Does the patient have any new or worsening symptoms? ---Yes Will a triage be completed? ---Yes Related visit to physician within the last 2 weeks? ---No Does the PT have any chronic conditions? (i.e. diabetes, asthma, this includes High risk factors for pregnancy, etc.) ---Yes List chronic conditions. ---leaky heart valve Is this a behavioral health or  substance abuse call? ---No Guidelines Guideline Title Affirmed Question Affirmed Notes Nurse Date/Time (Eastern Time) Coronavirus (COVID-19) - Diagnosed or Suspected [1] COVID-19 infection suspected by caller or triager AND [2] mild symptoms (cough, fever, Catha Brow 04/23/2019 2:11:59 PM PLEASE NOTE: All timestamps contained within this report are represented as [place] Standard Time. CONFIDENTIALTY NOTICE: This fax transmission is intended only for the addressee. It contains information that is legally privileged, confidential or otherwise protected from use or disclosure. If you are not the intended recipient, you are strictly prohibited from reviewing, disclosing, copying using or disseminating any of this information or taking any action in reliance on or regarding this information. If you have received this fax in error, please notify us immediately by telephone so that we can arrange for its return to [place]. Phone: [phone], Toll-Free: [phone], Fax: [phone] Page: 2 of 2 Call Id: [id number] Guidelines Guideline Title Affirmed Question Affirmed Notes Nurse Date/Time Eilene Ghazi Time) or others) AND [id number] no complications or SOB Disp. Time Eilene Ghazi Time) Disposition Final User 04/23/2019 2:06:52 PM Send to Urgent Queue Cherlynn Perches 04/23/2019 2:18:27 PM Call PCP when Office is Open Yes Joline Salt, RN, Erskine Speed Disagree/Comply Comply Caller Understands Yes PreDisposition Did not know what to do Care Advice Given Per Guideline CALL PCP WHEN OFFICE IS OPEN: REASSURANCE AND EDUCATION - SUSPECTED COVID-19: GENERAL CARE ADVICE FOR COVID-19 SYMPTOMS: HUMIDIFIER: CALL BACK IF: * You become worse. * Chest pain or difficulty breathing occurs * Fever over 103 F (39.4 C) Comments User: Margretta Ditty, RN Date/Time Eilene Ghazi Time): 04/23/2019 2:10:38 PM She saw her heart MD and he said she has a leaky heart valve Referrals REFERRED TO PCP OFFICE

## 2019-04-23 NOTE — Patient Instructions (Signed)
For anxiety and stress reaction I placed a counseling referral- you will get a call from the office  Try the  buspar 1/2 pill twice daily  If any intolerable side effects please let me know and hold it  Try to practice good self care and exercise   I also placed a referral to ENT for your nasal /facial discomfort and the throat fullness (globus sensation) The office will call you to arrange that also   Please make a follow up appointment with me in about a month (virtual is fine)

## 2019-04-23 NOTE — Telephone Encounter (Signed)
Pt already had virtual appt scheduled with Dr Glori Bickers today at [facility].

## 2019-04-23 NOTE — Assessment & Plan Note (Signed)
Ongoing and not improved with tx of allergies and acid reflux  Ref made to ENT for further eval

## 2019-04-23 NOTE — Progress Notes (Signed)
Virtual Visit via Video Note  I connected with Peggy Hicks on 04/23/19 at  3:00 PM EST by a video enabled telemedicine application and verified that I am speaking with the correct person using two identifiers.  Location: Patient: home  Provider: office    I discussed the limitations of evaluation and management by telemedicine and the availability of in person appointments. The patient expressed understanding and agreed to proceed.  Parties involved in encounter  Patient: Peggy Hicks  Provider:  Loura Pardon MD   History of Present Illness: Pt presents with throat tightness and sob /nasal problem   She continues to feel funny from the neck up  Some pressure /throbbing w/o pain  Usually feels a little mucous in throat  No congestion  House is dry - used an air cleaner  Needs to set up humidifier   No colored mucous No facial pain     Sob- as if she feels overwhelmed  CXR neg   No cough  No fever  Does not feel sick  No change in taste or smell   Using nasal spray    Stress- trying to care for mother and keep her safe  Working from home - work load is way up  Relationship with superiors has been better  Got tired of arguing  Anxious generally-worse  Appetite is ok - is working on wt loss- lost 8 lb so far  Used to do boot camp  Is interested in counseling     Youngest son had covid - no contact -he is doing well   Went to cardiology and dx a "leaky valve" (mitral valve insuff) Reassured about that    Patient Active Problem List   Diagnosis Date Noted  . Nasal discomfort 04/23/2019  . Chest discomfort 01/20/2019  . Dyspnea on exertion 01/01/2019  . Globus sensation 01/01/2019  . Left knee pain 10/03/2018  . Leiomyoma 06/04/2018  . Daily headache 06/29/2017  . Hx of adenomatous colonic polyps 04/14/2014  . Left lumbar radiculopathy 12/10/2012  . Routine general medical examination at a health care facility 01/01/2012  . Stress reaction 11/13/2011  .  Prediabetes 11/15/2010  . PRIMARY FOCAL HYPERHIDROSIS 10/22/2007  . TMJ (dislocation of temporomandibular joint) 01/25/2007  . Generalized anxiety disorder 08/01/2006   Past Medical History:  Diagnosis Date  . Fibroids   . Hx of adenomatous colonic polyps 04/14/2014  . Hyperhidrosis   . Stress reaction    Past Surgical History:  Procedure Laterality Date  . BREAST BIOPSY    . BREAST SURGERY     Breast Reduction  . CESAREAN SECTION      X 2  . HYSTERECTOMY ABDOMINAL WITH SALPINGECTOMY Bilateral 06/04/2018   Procedure: HYSTERECTOMY ABDOMINAL WITH SALPINGECTOMY;  Surgeon: Anastasio Auerbach, MD;  Location: [facility];  Service: Gynecology;  Laterality: Bilateral;  . HYSTEROSCOPY    . MYOMECTOMY     Uterine fibroids removed/ hysteroscopic submucous myomectomy  . TUBAL LIGATION     Social History   Tobacco Use  . Smoking status: Never Smoker  . Smokeless tobacco: Never Used  Substance Use Topics  . Alcohol use: Yes    Alcohol/week: 0.0 standard drinks    Comment: Rare  . Drug use: No   Family History  Problem Relation Age of Onset  . Hypertension Mother   . Heart disease Mother   . Diabetes Mother   . Hypertension Father   . Heart disease Father   . Cancer Maternal Aunt  liver  . Colon cancer Neg Hx   . Esophageal cancer Neg Hx   . Rectal cancer Neg Hx   . Stomach cancer Neg Hx    No Known Allergies Current Outpatient Medications on File Prior to Visit  Medication Sig Dispense Refill  . ALPRAZolam (XANAX) 0.5 MG tablet Take 1 tablet (0.5 mg total) by mouth daily as needed for anxiety. For traveling 30 tablet 0  . cetirizine (ZYRTEC) 10 MG tablet Take 10 mg by mouth daily as needed for allergies.    Marland Kitchen ELDERBERRY PO Take by mouth. Takes occassionally    . famotidine (PEPCID) 40 MG tablet Take 1 tablet (40 mg total) by mouth daily. 30 tablet 3  . fluticasone (FLONASE) 50 MCG/ACT nasal spray Place 1 spray into both nostrils daily as needed for  allergies or rhinitis.    . Lactobacillus (DIGESTIVE HEALTH PROBIOTIC) CAPS Take 1 capsule by mouth daily.    . naproxen sodium (ALEVE) 220 MG tablet Take 440 mg by mouth daily as needed (pain).    . Vitamin D, Cholecalciferol, 25 MCG (1000 UT) CAPS Take by mouth. Takes occasionally     No current facility-administered medications on file prior to visit.   Review of Systems  Constitutional: Negative for chills, fever and malaise/fatigue.  HENT: Negative for congestion, ear pain, sinus pain and sore throat.        Nasal passages are sore  Ears feel full  Sinus pressure but not pain   Sensation of mucous in throat   Eyes: Negative for blurred vision, discharge and redness.  Respiratory: Negative for cough, shortness of breath and stridor.   Cardiovascular: Negative for chest pain, palpitations and leg swelling.  Gastrointestinal: Negative for abdominal pain, diarrhea, heartburn, nausea and vomiting.  Musculoskeletal: Negative for myalgias.  Skin: Negative for rash.  Neurological: Negative for dizziness and headaches.  Psychiatric/Behavioral: Negative for depression. The patient is nervous/anxious.     Observations/Objective: Patient appears well, in no distress Weight is baseline  No facial swelling or asymmetry Normal voice-not hoarse and no slurred speech No obvious tremor or mobility impairment Moving neck and UEs normally Able to hear the call well  No cough or shortness of breath during interview  Talkative and mentally sharp with no cognitive changes No skin changes on face or neck , no rash or pallor Affect is mildly anxious  Pleasant and candid when speaking about stressors   Assessment and Plan: Problem List Items Addressed This Visit      Other   Generalized anxiety disorder - Primary    Pt is noticing this more (with constant stressors)  This may be adding to physical symptoms and worry about health  Reviewed stressors/ coping techniques/symptoms/ support  sources/ tx options and side effects in detail today  Ref made for counseling/ CBT Px buspar 7.5 mg bid  Discussed expectations of this medication including time to effectiveness and mechanism of action, also poss of side effects (early and late)- including mental fuzziness, weight or appetite change, nausea and poss of worse dep or anxiety (even suicidal thoughts)  Pt voiced understanding and will stop med and update if this occurs   If well tolerated pt will plan a virtual f/u in 1 month  Enc good self care/especially exercise       Relevant Medications   busPIRone (BUSPAR) 15 MG tablet   Other Relevant Orders   Ambulatory referral to Psychology   Stress reaction    Worsening general anx disorder-see a/p  Relevant Medications   busPIRone (BUSPAR) 15 MG tablet   Other Relevant Orders   Ambulatory referral to Psychology   Globus sensation    Ongoing and not improved with tx of allergies and acid reflux  Ref made to ENT for further eval       Relevant Orders   Ambulatory referral to ENT   Nasal discomfort    No improvement with allergy treatment  No headache or purulent nasal drainage Ref made to ENT for this and globus sensation       Relevant Orders   Ambulatory referral to ENT       Follow Up Instructions: For anxiety and stress reaction I placed a counseling referral- you will get a call from the office  Try the  buspar 1/2 pill twice daily  If any intolerable side effects please let me know and hold it  Try to practice good self care and exercise   I also placed a referral to ENT for your nasal /facial discomfort and the throat fullness (globus sensation) The office will call you to arrange that also   Please make a follow up appointment with me in about a month (virtual is fine)    I discussed the assessment and treatment plan with the patient. The patient was provided an opportunity to ask questions and all were answered. The patient agreed with the plan  and demonstrated an understanding of the instructions.   The patient was advised to call back or seek an in-person evaluation if the symptoms worsen or if the condition fails to improve as anticipated.     Loura Pardon, MD

## 2019-04-24 ENCOUNTER — Ambulatory Visit: Payer: Managed Care, Other (non HMO) | Admitting: Cardiology

## 2019-05-07 ENCOUNTER — Encounter: Payer: Self-pay | Admitting: Internal Medicine

## 2019-05-29 ENCOUNTER — Other Ambulatory Visit: Payer: Self-pay

## 2019-05-29 ENCOUNTER — Ambulatory Visit (AMBULATORY_SURGERY_CENTER): Payer: Self-pay | Admitting: *Deleted

## 2019-05-29 VITALS — Temp 96.6°F | Ht 65.75 in | Wt 185.0 lb

## 2019-05-29 DIAGNOSIS — Z86010 Personal history of colonic polyps: Secondary | ICD-10-CM

## 2019-05-29 NOTE — Progress Notes (Signed)

## 2019-06-03 ENCOUNTER — Telehealth: Payer: Self-pay

## 2019-06-03 DIAGNOSIS — R198 Other specified symptoms and signs involving the digestive system and abdomen: Secondary | ICD-10-CM

## 2019-06-03 DIAGNOSIS — R0989 Other specified symptoms and signs involving the circulatory and respiratory systems: Secondary | ICD-10-CM

## 2019-06-03 NOTE — Telephone Encounter (Signed)
I cannot do that but I can send her to an allergist - would she like [place] to make that referral?   Are her symptoms better without eating seafood?

## 2019-06-03 NOTE — Telephone Encounter (Signed)
Spoke with patient today to follow up on ENT referral that was placed on 04/23/19. Patient states she wanted to get that cancelled because she thinks her symptoms are coming from seafood allergy possibly. She notices the sensation of sinus pressure and feeling like something is stuck in her throat, the reason she saw Dr Glori Bickers in January, when she eats seafood. Patient is asking if she can have labs done to check for this allergy?

## 2019-06-04 ENCOUNTER — Other Ambulatory Visit: Payer: Self-pay

## 2019-06-04 NOTE — Telephone Encounter (Signed)
She said she doesn't have the feeling often in her throat but she realized the few times she has eaten seafood recently that she gets that feeling. So pt would like to proceed with allergist referral to see if she is in fact allergic to seafood, please put referral in and I advise pt our [facility] will call to schedule appt

## 2019-06-04 NOTE — Telephone Encounter (Signed)
Ref done  Will route to [facility] 

## 2019-06-05 ENCOUNTER — Encounter: Payer: Self-pay | Admitting: Family Medicine

## 2019-06-05 ENCOUNTER — Encounter: Payer: Self-pay | Admitting: Obstetrics and Gynecology

## 2019-06-05 ENCOUNTER — Ambulatory Visit: Payer: Managed Care, Other (non HMO) | Admitting: Obstetrics and Gynecology

## 2019-06-05 VITALS — BP 122/76 | Ht 65.5 in | Wt 183.0 lb

## 2019-06-05 DIAGNOSIS — Z90710 Acquired absence of both cervix and uterus: Secondary | ICD-10-CM

## 2019-06-05 DIAGNOSIS — R4586 Emotional lability: Secondary | ICD-10-CM | POA: Diagnosis not present

## 2019-06-05 DIAGNOSIS — R232 Flushing: Secondary | ICD-10-CM

## 2019-06-05 DIAGNOSIS — Z01419 Encounter for gynecological examination (general) (routine) without abnormal findings: Secondary | ICD-10-CM | POA: Diagnosis not present

## 2019-06-05 NOTE — Addendum Note (Signed)
Addended by: Nelva Nay on: 06/05/2019 11:37 AM   Modules accepted: Orders

## 2019-06-05 NOTE — Progress Notes (Signed)
Peggy Hicks September 12, 1965 [id number]  SUBJECTIVE:  54 y.o. G2P2002 female for annual routine gynecologic exam and Pap smear. She had a hysterectomy last year and has been doing well since that.  Did retain her ovaries.  She has been having mild hot flashes some nights, in addition to minor mood changes.  Current Outpatient Medications  Medication Sig Dispense Refill  . ALPRAZolam (XANAX) 0.5 MG tablet Take 1 tablet (0.5 mg total) by mouth daily as needed for anxiety. For traveling 30 tablet 0  . cetirizine (ZYRTEC) 10 MG tablet Take 10 mg by mouth daily as needed for allergies.    Marland Kitchen ELDERBERRY PO Take by mouth. Takes occassionally    . famotidine (PEPCID) 40 MG tablet Take 1 tablet (40 mg total) by mouth daily. 30 tablet 3  . fluticasone (FLONASE) 50 MCG/ACT nasal spray Place 1 spray into both nostrils daily as needed for allergies or rhinitis.    . Lactobacillus (DIGESTIVE HEALTH PROBIOTIC) CAPS Take 1 capsule by mouth daily.    . naproxen sodium (ALEVE) 220 MG tablet Take 440 mg by mouth daily as needed (pain).    . Vitamin D, Cholecalciferol, 25 MCG (1000 UT) CAPS Take by mouth. Takes occasionally    . busPIRone (BUSPAR) 15 MG tablet Take 0.5 tablets (7.5 mg total) by mouth 2 (two) times daily. (Patient not taking: Reported on 06/05/2019) 30 tablet 5   No current facility-administered medications for this visit.   Allergies: Patient has no known allergies.  Patient's last menstrual period was 03/03/2018 (approximate).  Past medical history,surgical history, problem list, medications, allergies, family history and social history were all reviewed and documented as reviewed in the EPIC chart.  ROS:  Feeling well. No dyspnea or chest pain on exertion.  No abdominal pain, change in bowel habits, black or bloody stools.  No urinary tract symptoms. GYN ROS:  no abnormal bleeding, pelvic pain or discharge, no breast pain or new or enlarging lumps on self exam. No neurological  complaints.   OBJECTIVE:  BP 122/76   Ht 5' 5.5" (1.664 m)   Wt 183 lb (83 kg)   LMP 03/03/2018 (Approximate)   BMI 29.99 kg/m  The patient appears well, alert, oriented x 3, in no distress. ENT normal.  Neck supple. No cervical or supraclavicular adenopathy or thyromegaly.  Lungs are clear, good air entry, no wheezes, rhonchi or rales. S1 and S2 normal, no murmurs, regular rate and rhythm.  Abdomen soft without tenderness, guarding, mass or organomegaly.  Abdominal surgical scar healing well with keloid noted. Neurological is normal, no focal findings.  BREAST EXAM: breasts appear normal, no suspicious masses, no skin or nipple changes or axillary nodes  PELVIC EXAM: VULVA: normal appearing vulva with no masses, tenderness or lesions, VAGINA: normal appearing vagina with normal color and discharge, no lesions, CERVIX: surgically absent, UTERUS: surgically absent, vaginal cuff well healed, ADNEXA: normal adnexa in size, nontender and no masses, PAP: Pap smear done today, thin-prep method  Chaperone: [name] present during the examination  ASSESSMENT:  54 y.o. [id number] here for annual gynecologic exam  PLAN:   1. Perimenopausal. Prior TAH and bilateral salpingectomy in 2020 for leiomyoma.  She has been healing fine.  No vaginal bleeding or pain.  Minor peri/postmenopausal-like symptoms but she does not want to consider hormone therapy at this point which is fine.  She will let us know if the symptoms get unbearable. 2. Pap smear/HPV 2015. No history of abnormal Pap smears.  She does  prefer to continue Pap smear screening, so Pap smear is obtained today. 3. Mammogram 10/2018.  Normal breast exam today.  She is reminded to schedule an annual mammogram this year when due. 4. Colonoscopy 2016.  Recommended that she follow up at the recommended interval.  She says she has a colonoscopy this next week.   5. DEXA never.  Recommended when further in to menopause. 6. Health maintenance.  No  labs today as she normally has these completed with her primary care provider.    Return annually or sooner, prn.  Joseph Pierini MD  06/05/19

## 2019-06-05 NOTE — Telephone Encounter (Signed)
Called patient and left message. Sent letter to patient that we sent a Allergy request to Dr Ted Mcalpine office for her to call them to get scheduled.

## 2019-06-06 LAB — PAP IG W/ RFLX HPV ASCU

## 2019-06-09 ENCOUNTER — Encounter: Payer: Self-pay | Admitting: Internal Medicine

## 2019-06-10 ENCOUNTER — Other Ambulatory Visit
Admission: RE | Admit: 2019-06-10 | Discharge: 2019-06-10 | Disposition: A | Discharge status: Home / Self Care | Payer: Managed Care, Other (non HMO) | Source: Ambulatory Visit | Attending: Internal Medicine | Admitting: Internal Medicine

## 2019-06-10 ENCOUNTER — Telehealth: Payer: Self-pay | Admitting: Family Medicine

## 2019-06-10 DIAGNOSIS — Z20822 Contact with and (suspected) exposure to covid-19: Secondary | ICD-10-CM | POA: Diagnosis not present

## 2019-06-10 DIAGNOSIS — R7303 Prediabetes: Secondary | ICD-10-CM

## 2019-06-10 DIAGNOSIS — Z01812 Encounter for preprocedural laboratory examination: Secondary | ICD-10-CM | POA: Diagnosis present

## 2019-06-10 DIAGNOSIS — Z0000 Encounter for general adult medical examination without abnormal findings: Secondary | ICD-10-CM

## 2019-06-10 LAB — SARS CORONAVIRUS 2 (TAT 6-24 HRS): SARS Coronavirus 2: NEGATIVE

## 2019-06-10 NOTE — Telephone Encounter (Signed)
-----   Message from Ellamae Sia sent at 06/05/2019  2:34 PM EST ----- Regarding: Lab orders for Monday, 3.15.21 Patient is scheduled for CPX labs, please order future labs, Thanks , Terri  [facility]  Just a reminder it's for [facility]... ):

## 2019-06-12 ENCOUNTER — Ambulatory Visit (AMBULATORY_SURGERY_CENTER): Payer: Managed Care, Other (non HMO) | Admitting: Internal Medicine

## 2019-06-12 ENCOUNTER — Other Ambulatory Visit: Payer: Self-pay

## 2019-06-12 ENCOUNTER — Encounter: Payer: Self-pay | Admitting: Internal Medicine

## 2019-06-12 VITALS — BP 130/86 | HR 62 | Temp 96.2°F | Resp 16 | Ht 65.75 in | Wt 185.0 lb

## 2019-06-12 DIAGNOSIS — D122 Benign neoplasm of ascending colon: Secondary | ICD-10-CM | POA: Diagnosis not present

## 2019-06-12 DIAGNOSIS — Z86010 Personal history of colonic polyps: Secondary | ICD-10-CM

## 2019-06-12 DIAGNOSIS — D124 Benign neoplasm of descending colon: Secondary | ICD-10-CM

## 2019-06-12 MED ORDER — SODIUM CHLORIDE 0.9 % IV SOLN: 500.0000 mL | Freq: Once | INTRAVENOUS | Status: DC

## 2019-06-12 NOTE — Progress Notes (Signed)
Called to room to assist during endoscopic procedure.  Patient ID and intended procedure confirmed with present staff. Received instructions for my participation in the procedure from the performing physician.  

## 2019-06-12 NOTE — Progress Notes (Signed)
Report given to PACU, vss 

## 2019-06-12 NOTE — Patient Instructions (Addendum)
I found and removed 2 tiny polyps. I will let you know pathology results and when to have another routine colonoscopy by mail and/or My Chart.  I appreciate the opportunity to care for you. Gatha Mayer, MD, [facility]   Handouts Provided:  Polyps  YOU HAD AN ENDOSCOPIC PROCEDURE TODAY AT [facility]:   Refer to the procedure report that was given to you for any specific questions about what was found during the examination.  If the procedure report does not answer your questions, please call your gastroenterologist to clarify.  If you requested that your care partner not be given the details of your procedure findings, then the procedure report has been included in a sealed envelope for you to review at your convenience later.  YOU SHOULD EXPECT: Some feelings of bloating in the abdomen. Passage of more gas than usual.  Walking can help get rid of the air that was put into your GI tract during the procedure and reduce the bloating. If you had a lower endoscopy (such as a colonoscopy or flexible sigmoidoscopy) you may notice spotting of blood in your stool or on the toilet paper. If you underwent a bowel prep for your procedure, you may not have a normal bowel movement for a few days.  Please Note:  You might notice some irritation and congestion in your nose or some drainage.  This is from the oxygen used during your procedure.  There is no need for concern and it should clear up in a day or so.  SYMPTOMS TO REPORT IMMEDIATELY:   Following lower endoscopy (colonoscopy or flexible sigmoidoscopy):  Excessive amounts of blood in the stool  Significant tenderness or worsening of abdominal pains  Swelling of the abdomen that is new, acute  Fever of 100F or higher  For urgent or emergent issues, a gastroenterologist can be reached at any hour by calling [phone]. Do not use MyChart messaging for urgent concerns.    DIET:  We do recommend a small meal at first, but then you  may proceed to your regular diet.  Drink plenty of fluids but you should avoid alcoholic beverages for 24 hours.  ACTIVITY:  You should plan to take it easy for the rest of today and you should NOT DRIVE or use heavy machinery until tomorrow (because of the sedation medicines used during the test).    FOLLOW UP: Our staff will call the number listed on your records 48-72 hours following your procedure to check on you and address any questions or concerns that you may have regarding the information given to you following your procedure. If we do not reach you, we will leave a message.  We will attempt to reach you two times.  During this call, we will ask if you have developed any symptoms of COVID 19. If you develop any symptoms (ie: fever, flu-like symptoms, shortness of breath, cough etc.) before then, please call [phone].  If you test positive for Covid 19 in the 2 weeks post procedure, please call and report this information to [place].    If any biopsies were taken you will be contacted by phone or by letter within the next 1-3 weeks.  Please call us at [phone] if you have not heard about the biopsies in 3 weeks.    SIGNATURES/CONFIDENTIALITY: You and/or your care partner have signed paperwork which will be entered into your electronic medical record.  These signatures attest to the fact that that the information  above on your After Visit Summary has been reviewed and is understood.  Full responsibility of the confidentiality of this discharge information lies with you and/or your care-partner.

## 2019-06-12 NOTE — Op Note (Signed)
[facility] Patient Name: Peggy Hicks Procedure Date: 06/12/2019 8:38 AM MRN: [id number] Endoscopist: Gatha Mayer , MD Age: 54 Referring MD:  Date of Birth: 22-Nov-1965 Gender: Female Account #: [id number] Procedure:                Colonoscopy Indications:              Surveillance: Personal history of adenomatous                            polyps on last colonoscopy 5 years ago Medicines:                Propofol per Anesthesia, Monitored Anesthesia Care Procedure:                Pre-Anesthesia Assessment:                           - Prior to the procedure, a History and Physical                            was performed, and patient medications and                            allergies were reviewed. The patient's tolerance of                            previous anesthesia was also reviewed. The risks                            and benefits of the procedure and the sedation                            options and risks were discussed with the patient.                            All questions were answered, and informed consent                            was obtained. Prior Anticoagulants: The patient has                            taken no previous anticoagulant or antiplatelet                            agents. ASA Grade Assessment: II - A patient with                            mild systemic disease. After reviewing the risks                            and benefits, the patient was deemed in                            satisfactory condition to undergo the procedure.  After obtaining informed consent, the colonoscope                            was passed under direct vision. Throughout the                            procedure, the patient's blood pressure, pulse, and                            oxygen saturations were monitored continuously. The                            Colonoscope was introduced through the anus and   advanced to the the cecum, identified by                            appendiceal orifice and ileocecal valve. The                            colonoscopy was performed without difficulty. The                            patient tolerated the procedure well. The quality                            of the bowel preparation was excellent. The bowel                            preparation used was Miralax via split dose                            instruction. The ileocecal valve, appendiceal                            orifice, and rectum were photographed. Scope In: 9:03:07 AM Scope Out: 9:20:33 AM Scope Withdrawal Time: 0 hours 13 minutes 39 seconds  Total Procedure Duration: 0 hours 17 minutes 26 seconds  Findings:                 The perianal and digital rectal examinations were                            normal.                           Two sessile polyps were found in the descending                            colon and ascending colon. The polyps were                            diminutive in size. These polyps were removed with                            a cold snare. Resection and retrieval were  complete. Verification of patient identification                            for the specimen was done. Estimated blood loss was                            minimal.                           The exam was otherwise without abnormality on                            direct and retroflexion views. Complications:            No immediate complications. Estimated Blood Loss:     Estimated blood loss was minimal. Impression:               - Two diminutive polyps in the descending colon and                            in the ascending colon, removed with a cold snare.                            Resected and retrieved.                           - The examination was otherwise normal on direct                            and retroflexion views.                           - Personal  history of colonic polyps 3 diminutive                            adenoms 04/2014. Recommendation:           - Patient has a contact number available for                            emergencies. The signs and symptoms of potential                            delayed complications were discussed with the                            patient. Return to normal activities tomorrow.                            Written discharge instructions were provided to the                            patient.                           - Resume previous diet.                           -  Continue present medications.                           - Repeat colonoscopy is recommended for                            surveillance. The colonoscopy date will be                            determined after pathology results from today's                            exam become available for review. Gatha Mayer, MD 06/12/2019 9:31:51 AM This report has been signed electronically.

## 2019-06-12 NOTE — Progress Notes (Signed)
Pt's states no medical or surgical changes since previsit or office visit. 

## 2019-06-12 NOTE — Progress Notes (Signed)
Temperature taken by L.C., VS taken by D.T. 

## 2019-06-16 ENCOUNTER — Telehealth: Payer: Self-pay | Admitting: *Deleted

## 2019-06-16 ENCOUNTER — Telehealth: Payer: Self-pay

## 2019-06-16 ENCOUNTER — Other Ambulatory Visit: Payer: Self-pay

## 2019-06-16 ENCOUNTER — Other Ambulatory Visit (INDEPENDENT_AMBULATORY_CARE_PROVIDER_SITE_OTHER): Payer: Managed Care, Other (non HMO)

## 2019-06-16 DIAGNOSIS — Z0000 Encounter for general adult medical examination without abnormal findings: Secondary | ICD-10-CM | POA: Diagnosis not present

## 2019-06-16 DIAGNOSIS — R7303 Prediabetes: Secondary | ICD-10-CM

## 2019-06-16 NOTE — Telephone Encounter (Signed)
1st follow up call made.  NALM 

## 2019-06-16 NOTE — Telephone Encounter (Signed)
  Follow up Call-  Call back number 06/12/2019  Post procedure Call Back phone  # [phone]  Permission to leave phone message Yes  Some recent data might be hidden     Patient questions:  Do you have a fever, pain , or abdominal swelling? No. Pain Score  0 *  Have you tolerated food without any problems? Yes.    Have you been able to return to your normal activities? Yes.    Do you have any questions about your discharge instructions: Diet   No. Medications  No. Follow up visit  No.  Do you have questions or concerns about your Care? No.  Actions: * If pain score is 4 or above: No action needed, pain <4.  1. Have you developed a fever since your procedure? no  2.   Have you had an respiratory symptoms (SOB or cough) since your procedure? no  3.   Have you tested positive for COVID 19 since your procedure no  4.   Have you had any family members/close contacts diagnosed with the COVID 19 since your procedure?  no   If yes to any of these questions please route to Joylene John, RN and Alphonsa Gin, Therapist, sports.

## 2019-06-17 LAB — CBC WITH DIFFERENTIAL/PLATELET
Basophils Absolute: 0 10*3/uL (ref 0.0–0.2)
Basos: 1 %
EOS (ABSOLUTE): 0 10*3/uL (ref 0.0–0.4)
Eos: 1 %
Hematocrit: 40.7 % (ref 34.0–46.6)
Hemoglobin: 13.6 g/dL (ref 11.1–15.9)
Immature Grans (Abs): 0 10*3/uL (ref 0.0–0.1)
Immature Granulocytes: 0 %
Lymphocytes Absolute: 3.2 10*3/uL — ABNORMAL HIGH (ref 0.7–3.1)
Lymphs: 50 %
MCH: 28.6 pg (ref 26.6–33.0)
MCHC: 33.4 g/dL (ref 31.5–35.7)
MCV: 86 fL (ref 79–97)
Monocytes Absolute: 0.3 10*3/uL (ref 0.1–0.9)
Monocytes: 5 %
Neutrophils Absolute: 2.7 10*3/uL (ref 1.4–7.0)
Neutrophils: 43 %
Platelets: 283 10*3/uL (ref 150–450)
RBC: 4.75 x10E6/uL (ref 3.77–5.28)
RDW: 13 % (ref 11.7–15.4)
WBC: 6.3 10*3/uL (ref 3.4–10.8)

## 2019-06-17 LAB — COMPREHENSIVE METABOLIC PANEL
ALT: 10 IU/L (ref 0–32)
AST: 19 IU/L (ref 0–40)
Albumin/Globulin Ratio: 1.7 (ref 1.2–2.2)
Albumin: 4.2 g/dL (ref 3.8–4.9)
Alkaline Phosphatase: 90 IU/L (ref 39–117)
BUN/Creatinine Ratio: 16 (ref 9–23)
BUN: 14 mg/dL (ref 6–24)
Bilirubin Total: 0.4 mg/dL (ref 0.0–1.2)
CO2: 24 mmol/L (ref 20–29)
Calcium: 9.5 mg/dL (ref 8.7–10.2)
Chloride: 104 mmol/L (ref 96–106)
Creatinine, Ser: 0.86 mg/dL (ref 0.57–1.00)
GFR calc Af Amer: 88 mL/min/{1.73_m2} (ref >59)
GFR calc non Af Amer: 76 mL/min/{1.73_m2} (ref >59)
Globulin, Total: 2.5 g/dL (ref 1.5–4.5)
Glucose: 99 mg/dL (ref 65–99)
Potassium: 4.8 mmol/L (ref 3.5–5.2)
Sodium: 142 mmol/L (ref 134–144)
Total Protein: 6.7 g/dL (ref 6.0–8.5)

## 2019-06-17 LAB — LIPID PANEL
Chol/HDL Ratio: 2.5 ratio (ref 0.0–4.4)
Cholesterol, Total: 204 mg/dL — ABNORMAL HIGH (ref 100–199)
HDL: 81 mg/dL (ref >39)
LDL Chol Calc (NIH): 111 mg/dL — ABNORMAL HIGH (ref 0–99)
Triglycerides: 67 mg/dL (ref 0–149)
VLDL Cholesterol Cal: 12 mg/dL (ref 5–40)

## 2019-06-17 LAB — TSH: TSH: 2.38 u[IU]/mL (ref 0.450–4.500)

## 2019-06-17 LAB — HEMOGLOBIN A1C
Est. average glucose Bld gHb Est-mCnc: 117 mg/dL
Hgb A1c MFr Bld: 5.7 % — ABNORMAL HIGH (ref 4.8–5.6)

## 2019-06-18 ENCOUNTER — Encounter: Payer: Self-pay | Admitting: Internal Medicine

## 2019-06-18 ENCOUNTER — Encounter: Payer: Self-pay | Admitting: Family Medicine

## 2019-06-18 ENCOUNTER — Ambulatory Visit (INDEPENDENT_AMBULATORY_CARE_PROVIDER_SITE_OTHER): Payer: Managed Care, Other (non HMO) | Admitting: Family Medicine

## 2019-06-18 ENCOUNTER — Other Ambulatory Visit: Payer: Self-pay

## 2019-06-18 VITALS — BP 122/78 | HR 60 | Temp 97.5°F | Ht 65.5 in | Wt 183.3 lb

## 2019-06-18 DIAGNOSIS — H6120 Impacted cerumen, unspecified ear: Secondary | ICD-10-CM | POA: Insufficient documentation

## 2019-06-18 DIAGNOSIS — Z0000 Encounter for general adult medical examination without abnormal findings: Secondary | ICD-10-CM | POA: Diagnosis not present

## 2019-06-18 DIAGNOSIS — R0989 Other specified symptoms and signs involving the circulatory and respiratory systems: Secondary | ICD-10-CM

## 2019-06-18 DIAGNOSIS — R7303 Prediabetes: Secondary | ICD-10-CM | POA: Diagnosis not present

## 2019-06-18 DIAGNOSIS — H6121 Impacted cerumen, right ear: Secondary | ICD-10-CM

## 2019-06-18 DIAGNOSIS — R198 Other specified symptoms and signs involving the digestive system and abdomen: Secondary | ICD-10-CM

## 2019-06-18 DIAGNOSIS — F411 Generalized anxiety disorder: Secondary | ICD-10-CM | POA: Diagnosis not present

## 2019-06-18 MED ORDER — FAMOTIDINE 40 MG PO TABS: 40.0000 mg | ORAL_TABLET | Freq: Every day | ORAL | 3 refills | Status: DC

## 2019-06-18 NOTE — Assessment & Plan Note (Signed)
Pt has upcoming allergist appt -wants to be tested for food /shellfish and other allergies  Also disc poss anxiety cause-she may try the buspar and update

## 2019-06-18 NOTE — Assessment & Plan Note (Signed)
Reviewed health habits including diet and exercise and skin cancer prevention Reviewed appropriate screening tests for age  Also reviewed health mt list, fam hx and immunization status , as well as social and family history   See HPI Labs reviewed  She is going for her fist covid vaccine today  Mammogram is sue in July utd colonoscopy  Commended on better diet/exercise lately

## 2019-06-18 NOTE — Progress Notes (Signed)
Subjective:    Patient ID: Peggy Hicks, female    DOB: 23-Dec-1965, 54 y.o.   MRN: [id number]  This visit occurred during the SARS-CoV-2 public health emergency.  Safety protocols were in place, including screening questions prior to the visit, additional usage of staff PPE, and extensive cleaning of exam room while observing appropriate contact time as indicated for disinfecting solutions.    HPI Here for health maintenance exam and to review chronic medical problems    Wt Readings from Last 3 Encounters:  06/18/19 183 lb 5 oz (83.2 kg)  06/12/19 185 lb (83.9 kg)  06/05/19 183 lb (83 kg)  starting a home exercise program - like a boot camp  Eats healthy also  Motivated  Needs to back off of fried foods more  30.04 kg/m   Zoster status - would be interested in shingrix   Has covid vaccine scheduled today  Flu vaccine -got it in the fall in [facility]   Mammogram 7/20 Self breast exam - no lumps   Colonoscopy 3/21 (recent) with 5 y recall   Tdap 1/18   gen anx disorder  Last visit ref to counseling  Px buspar - has not started it yet  It has been  "ok"  She still gets a funny feeling in her throat   ? If allergies  Feels funny if she eats seafood  Going to allergist soon   Prediabetes Lab Results  Component Value Date   HGBA1C 5.7 (H) 06/16/2019  is eating healthy Improved  Mother has DM   Cholesterol Lab Results  Component Value Date   CHOL 204 (H) 06/16/2019   CHOL 208 (H) 05/03/2018   CHOL 239 (H) 04/27/2017   Lab Results  Component Value Date   HDL 81 06/16/2019   HDL 84 05/03/2018   HDL 95 04/27/2017   Lab Results  Component Value Date   LDLCALC 111 (H) 06/16/2019   LDLCALC 106 (H) 05/03/2018   LDLCALC 120 (H) 04/27/2017   Lab Results  Component Value Date   TRIG 67 06/16/2019   TRIG 89 05/03/2018   TRIG 120 04/27/2017   Lab Results  Component Value Date   CHOLHDL 2.5 06/16/2019   CHOLHDL 2.5 05/03/2018   CHOLHDL 2.5 04/27/2017     No results found for: LDLDIRECT Good ratio   Lab Results  Component Value Date   TSH 2.380 06/16/2019    Lab Results  Component Value Date   WBC 6.3 06/16/2019   HGB 13.6 06/16/2019   HCT 40.7 06/16/2019   MCV 86 06/16/2019   PLT 283 06/16/2019    Lab Results  Component Value Date   CREATININE 0.86 06/16/2019   BUN 14 06/16/2019   NA 142 06/16/2019   K 4.8 06/16/2019   CL 104 06/16/2019   CO2 24 06/16/2019   Lab Results  Component Value Date   ALT 10 06/16/2019   AST 19 06/16/2019   ALKPHOS 90 06/16/2019   BILITOT 0.4 06/16/2019    Patient Active Problem List   Diagnosis Date Noted  . Cerumen impaction 06/18/2019  . Nasal discomfort 04/23/2019  . Dyspnea on exertion 01/01/2019  . Globus sensation 01/01/2019  . Left knee pain 10/03/2018  . Daily headache 06/29/2017  . Hx of adenomatous colonic polyps 04/14/2014  . Routine general medical examination at a health care facility 01/01/2012  . Stress reaction 11/13/2011  . Prediabetes 11/15/2010  . PRIMARY FOCAL HYPERHIDROSIS 10/22/2007  . TMJ (dislocation of temporomandibular joint) 01/25/2007  .  Generalized anxiety disorder 08/01/2006   Past Medical History:  Diagnosis Date  . Allergy   . Arthritis    knee  . Fibroids   . GERD (gastroesophageal reflux disease)   . Heart valve regurgitation 2020  . Hx of adenomatous colonic polyps 04/14/2014  . Hyperhidrosis   . Stress reaction    Past Surgical History:  Procedure Laterality Date  . BREAST BIOPSY    . BREAST SURGERY     Breast Reduction  . CESAREAN SECTION      X 2  . COLONOSCOPY    . HYSTERECTOMY ABDOMINAL WITH SALPINGECTOMY Bilateral 06/04/2018   Procedure: HYSTERECTOMY ABDOMINAL WITH SALPINGECTOMY;  Surgeon: Anastasio Auerbach, MD;  Location: [facility] [place] [facility];  Service: Gynecology;  Laterality: Bilateral;  . HYSTEROSCOPY    . MYOMECTOMY     Uterine fibroids removed/ hysteroscopic submucous myomectomy  . POLYPECTOMY    . TUBAL LIGATION      Social History   Tobacco Use  . Smoking status: Never Smoker  . Smokeless tobacco: Never Used  Substance Use Topics  . Alcohol use: Yes    Alcohol/week: 0.0 standard drinks    Comment: Rare  . Drug use: No   Family History  Problem Relation Age of Onset  . Hypertension Mother   . Heart disease Mother   . Diabetes Mother   . Colon polyps Mother   . Hypertension Father   . Heart disease Father   . Cancer Maternal Aunt        liver  . Colon cancer Neg Hx   . Esophageal cancer Neg Hx   . Rectal cancer Neg Hx   . Stomach cancer Neg Hx    No Known Allergies Current Outpatient Medications on File Prior to Visit  Medication Sig Dispense Refill  . ALPRAZolam (XANAX) 0.5 MG tablet Take 1 tablet (0.5 mg total) by mouth daily as needed for anxiety. For traveling 30 tablet 0  . busPIRone (BUSPAR) 15 MG tablet Take 0.5 tablets (7.5 mg total) by mouth 2 (two) times daily. 30 tablet 5  . cetirizine (ZYRTEC) 10 MG tablet Take 10 mg by mouth daily as needed for allergies.    Marland Kitchen ELDERBERRY PO Take by mouth. Takes occassionally    . fluticasone (FLONASE) 50 MCG/ACT nasal spray Place 1 spray into both nostrils daily as needed for allergies or rhinitis.    . Lactobacillus (DIGESTIVE HEALTH PROBIOTIC) CAPS Take 1 capsule by mouth daily.    . naproxen sodium (ALEVE) 220 MG tablet Take 440 mg by mouth daily as needed (pain).    . Vitamin D, Cholecalciferol, 25 MCG (1000 UT) CAPS Take by mouth. Takes occasionally     No current facility-administered medications on file prior to visit.     Review of Systems  Constitutional: Negative for activity change, appetite change, fatigue, fever and unexpected weight change.  HENT: Negative for congestion, ear pain, rhinorrhea, sinus pressure, sore throat, trouble swallowing and voice change.        Sensation of drip/globus sens in throat  This may be worse after eating shellfish  Eyes: Negative for pain, redness and visual disturbance.  Respiratory:  Negative for cough, shortness of breath and wheezing.   Cardiovascular: Negative for chest pain and palpitations.  Gastrointestinal: Negative for abdominal pain, blood in stool, constipation and diarrhea.  Endocrine: Negative for polydipsia and polyuria.  Genitourinary: Negative for dysuria, frequency and urgency.  Musculoskeletal: Negative for arthralgias, back pain and myalgias.  Skin: Negative for pallor  and rash.  Allergic/Immunologic: Negative for environmental allergies.  Neurological: Negative for dizziness, syncope and headaches.  Hematological: Negative for adenopathy. Does not bruise/bleed easily.  Psychiatric/Behavioral: Negative for decreased concentration and dysphoric mood. The patient is nervous/anxious.        Objective:   Physical Exam Constitutional:      General: She is not in acute distress.    Appearance: Normal appearance. She is well-developed. She is obese. She is not ill-appearing or diaphoretic.  HENT:     Head: Normocephalic and atraumatic.     Right Ear: Tympanic membrane, ear canal and external ear normal. There is impacted cerumen.     Left Ear: Tympanic membrane, ear canal and external ear normal.     Ears:     Comments: Cerumen in R ear- easily cleared with simple irrigation      Nose: Nose normal. No congestion.     Mouth/Throat:     Mouth: Mucous membranes are moist.     Pharynx: Oropharynx is clear. No posterior oropharyngeal erythema.  Eyes:     General: No scleral icterus.    Extraocular Movements: Extraocular movements intact.     Conjunctiva/sclera: Conjunctivae normal.     Pupils: Pupils are equal, round, and reactive to light.  Neck:     Thyroid: No thyromegaly.     Vascular: No carotid bruit or JVD.  Cardiovascular:     Rate and Rhythm: Normal rate and regular rhythm.     Pulses: Normal pulses.     Heart sounds: Normal heart sounds. No gallop.   Pulmonary:     Effort: Pulmonary effort is normal. No respiratory distress.     Breath  sounds: Normal breath sounds. No wheezing.     Comments: Good air exch Chest:     Chest wall: No tenderness.  Abdominal:     General: Bowel sounds are normal. There is no distension or abdominal bruit.     Palpations: Abdomen is soft. There is no mass.     Tenderness: There is no abdominal tenderness.     Hernia: No hernia is present.  Genitourinary:    Comments: Breast exam: No mass, nodules, thickening, tenderness, bulging, retraction, inflamation, nipple discharge or skin changes noted.  No axillary or clavicular LA.     Musculoskeletal:        General: No tenderness. Normal range of motion.     Cervical back: Normal range of motion and neck supple. No rigidity. No muscular tenderness.     Right lower leg: No edema.     Left lower leg: No edema.     Comments: No kyphosis   Lymphadenopathy:     Cervical: No cervical adenopathy.  Skin:    General: Skin is warm and dry.     Coloration: Skin is not pale.     Findings: No erythema or rash.     Comments: Low transverse scar from hysterectomy-mild keloid  Neurological:     Mental Status: She is alert. Mental status is at baseline.     Cranial Nerves: No cranial nerve deficit.     Motor: No abnormal muscle tone.     Coordination: Coordination normal.     Gait: Gait normal.     Deep Tendon Reflexes: Reflexes are normal and symmetric. Reflexes normal.  Psychiatric:        Mood and Affect: Mood normal.        Cognition and Memory: Cognition and memory normal.     Comments: pleasant  Assessment & Plan:   Problem List Items Addressed This Visit      Nervous and Auditory   Cerumen impaction    R ear only  Cerumen removed with simple irrigation         Other   Generalized anxiety disorder    Pt has not decided whether to start the buspar or not  Still has globus sensation and may do so Otherwise doing ok      Prediabetes    Lab Results  Component Value Date   HGBA1C 5.7 (H) 06/16/2019   Good  disc imp of  low glycemic diet and wt loss to prevent DM2        Routine general medical examination at a health care facility - Primary    Reviewed health habits including diet and exercise and skin cancer prevention Reviewed appropriate screening tests for age  Also reviewed health mt list, fam hx and immunization status , as well as social and family history   See HPI Labs reviewed  She is going for her fist covid vaccine today  Mammogram is sue in July utd colonoscopy  Commended on better diet/exercise lately         Globus sensation    Pt has upcoming allergist appt -wants to be tested for food /shellfish and other allergies  Also disc poss anxiety cause-she may try the buspar and update

## 2019-06-18 NOTE — Assessment & Plan Note (Signed)
R ear only  Cerumen removed with simple irrigation

## 2019-06-18 NOTE — Assessment & Plan Note (Signed)
Pt has not decided whether to start the buspar or not  Still has globus sensation and may do so Otherwise doing ok

## 2019-06-18 NOTE — Patient Instructions (Addendum)
Get your covid vaccine   If you are interested in the shingles vaccine series (Shingrix), call your insurance or pharmacy to check on coverage and location it must be given.  If affordable - you can schedule it here or at your pharmacy depending on coverage   Continue good diet and exercise

## 2019-06-18 NOTE — Assessment & Plan Note (Signed)
Lab Results  Component Value Date   HGBA1C 5.7 (H) 06/16/2019   Good  disc imp of low glycemic diet and wt loss to prevent DM2

## 2019-07-28 ENCOUNTER — Ambulatory Visit (INDEPENDENT_AMBULATORY_CARE_PROVIDER_SITE_OTHER): Payer: 59 | Admitting: Psychology

## 2019-07-28 DIAGNOSIS — F410 Panic disorder [episodic paroxysmal anxiety] without agoraphobia: Secondary | ICD-10-CM

## 2019-08-11 ENCOUNTER — Ambulatory Visit (INDEPENDENT_AMBULATORY_CARE_PROVIDER_SITE_OTHER): Payer: 59 | Admitting: Psychology

## 2019-08-11 DIAGNOSIS — F410 Panic disorder [episodic paroxysmal anxiety] without agoraphobia: Secondary | ICD-10-CM | POA: Diagnosis not present

## 2019-09-09 ENCOUNTER — Ambulatory Visit: Payer: 59 | Admitting: Psychology

## 2019-09-10 ENCOUNTER — Encounter: Payer: Self-pay | Admitting: *Deleted

## 2019-09-15 ENCOUNTER — Ambulatory Visit: Payer: Managed Care, Other (non HMO) | Admitting: Obstetrics and Gynecology

## 2019-09-15 ENCOUNTER — Other Ambulatory Visit: Payer: Self-pay

## 2019-09-15 ENCOUNTER — Encounter: Payer: Self-pay | Admitting: Obstetrics and Gynecology

## 2019-09-15 VITALS — BP 118/78

## 2019-09-15 DIAGNOSIS — N9089 Other specified noninflammatory disorders of vulva and perineum: Secondary | ICD-10-CM | POA: Diagnosis not present

## 2019-09-15 NOTE — Progress Notes (Signed)
   JORRYN HERSHBERGER [birth year] [id number]  SUBJECTIVE:  54 y.o. G2P2002 female presents for evaluation of labial skin tags.  She says she has has a cluster of the skin growths on her labia minora that are catching on undergarments and sometimes causing inflammation and discomfort.  She says she has had skin tags removed and it appears this was in 2013 on a pathology report that indicated skin tags removed from the vulvar area.  No history of genital warts as far she is aware.  Current Outpatient Medications  Medication Sig Dispense Refill  . ALPRAZolam (XANAX) 0.5 MG tablet Take 1 tablet (0.5 mg total) by mouth daily as needed for anxiety. For traveling 30 tablet 0  . cetirizine (ZYRTEC) 10 MG tablet Take 10 mg by mouth daily as needed for allergies.    Marland Kitchen ELDERBERRY PO Take by mouth. Takes occassionally    . famotidine (PEPCID) 40 MG tablet Take 1 tablet (40 mg total) by mouth daily. 30 tablet 3  . fluticasone (FLONASE) 50 MCG/ACT nasal spray Place 1 spray into both nostrils daily as needed for allergies or rhinitis.    . Lactobacillus (DIGESTIVE HEALTH PROBIOTIC) CAPS Take 1 capsule by mouth daily.    . naproxen sodium (ALEVE) 220 MG tablet Take 440 mg by mouth daily as needed (pain).    . Vitamin D, Cholecalciferol, 25 MCG (1000 UT) CAPS Take by mouth. Takes occasionally    . busPIRone (BUSPAR) 15 MG tablet Take 0.5 tablets (7.5 mg total) by mouth 2 (two) times daily. (Patient not taking: Reported on 09/15/2019) 30 tablet 5   No current facility-administered medications for this visit.   Allergies: Patient has no known allergies.  Patient's last menstrual period was 03/03/2018 (approximate).  Past medical history,surgical history, problem list, medications, allergies, family history and social history were all reviewed and documented as reviewed in the EPIC chart.  OBJECTIVE:  BP 118/78   LMP 03/03/2018 (Approximate)  The patient appears well, alert, oriented x 3, in no distress. PELVIC  EXAM: VULVA: normal appearing vulva with no masses, tenderness.  Several skin tags noted along the edge of the right labia minora approximately 5-6 in count and measuring up to 1 to 3 mm in size.  Chaperone: [name] present during the examination  ASSESSMENT:  54 y.o. [phone] here for evaluation of vulvar skin tags  PLAN:  We discussed that it appears she has vulvar skin tags and if they are symptomatic we could remove them.  She was concerned about finances in regards to having a procedure today.   Therefore we did not perform an excision and I recommended that she think about how much she wants to have the skin tags removed before proceeding.  They did not appear to be warts but cannot guarantee this based on gross appearance alone.  She will let us know and follow-up as necessary.  Joseph Pierini MD 09/15/19

## 2019-09-17 ENCOUNTER — Encounter: Payer: Self-pay | Admitting: Family Medicine

## 2019-09-17 ENCOUNTER — Encounter: Payer: Self-pay | Admitting: Obstetrics and Gynecology

## 2019-09-17 ENCOUNTER — Other Ambulatory Visit: Payer: Self-pay

## 2019-09-17 ENCOUNTER — Ambulatory Visit: Payer: Managed Care, Other (non HMO) | Admitting: Family Medicine

## 2019-09-17 VITALS — BP 148/82 | HR 74 | Temp 97.8°F | Ht 65.5 in | Wt 190.8 lb

## 2019-09-17 DIAGNOSIS — N3090 Cystitis, unspecified without hematuria: Secondary | ICD-10-CM

## 2019-09-17 DIAGNOSIS — R319 Hematuria, unspecified: Secondary | ICD-10-CM | POA: Diagnosis not present

## 2019-09-17 DIAGNOSIS — R309 Painful micturition, unspecified: Secondary | ICD-10-CM

## 2019-09-17 LAB — POCT URINALYSIS DIPSTICK
Bilirubin, UA: NEGATIVE
Glucose, UA: NEGATIVE
Ketones, UA: NEGATIVE
Nitrite, UA: NEGATIVE
Protein, UA: POSITIVE — AB
Spec Grav, UA: 1.02 (ref 1.010–1.025)
Urobilinogen, UA: 0.2 E.U./dL
pH, UA: 6 (ref 5.0–8.0)

## 2019-09-17 MED ORDER — NITROFURANTOIN MONOHYD MACRO 100 MG PO CAPS: 100.0000 mg | ORAL_CAPSULE | Freq: Two times a day (BID) | ORAL | 0 refills | Status: AC

## 2019-09-17 NOTE — Progress Notes (Signed)
amber

## 2019-09-17 NOTE — Patient Instructions (Signed)
Good to see you today  I have sent in an antibiotic to your pharmacy  Continue to have good fluid intake, can take over the counter AZO, tylenol, ibuprofen as needed   Urinary Tract Infection, Adult A urinary tract infection (UTI) is an infection of any part of the urinary tract. The urinary tract includes:  The kidneys.  The ureters.  The bladder.  The urethra. These organs make, store, and get rid of pee (urine) in the body. What are the causes? This is caused by germs (bacteria) in your genital area. These germs grow and cause swelling (inflammation) of your urinary tract. What increases the risk? You are more likely to develop this condition if:  You have a small, thin tube (catheter) to drain pee.  You cannot control when you pee or poop (incontinence).  You are female, and: ? You use these methods to prevent pregnancy:  A medicine that kills sperm (spermicide).  A device that blocks sperm (diaphragm). ? You have low levels of a female hormone (estrogen). ? You are pregnant.  You have genes that add to your risk.  You are sexually active.  You take antibiotic medicines.  You have trouble peeing because of: ? A prostate that is bigger than normal, if you are female. ? A blockage in the part of your body that drains pee from the bladder (urethra). ? A kidney stone. ? A nerve condition that affects your bladder (neurogenic bladder). ? Not getting enough to drink. ? Not peeing often enough.  You have other conditions, such as: ? Diabetes. ? A weak disease-fighting system (immune system). ? Sickle cell disease. ? Gout. ? Injury of the spine. What are the signs or symptoms? Symptoms of this condition include:  Needing to pee right away (urgently).  Peeing often.  Peeing small amounts often.  Pain or burning when peeing.  Blood in the pee.  Pee that smells bad or not like normal.  Trouble peeing.  Pee that is cloudy.  Fluid coming from the  vagina, if you are female.  Pain in the belly or lower back. Other symptoms include:  Throwing up (vomiting).  No urge to eat.  Feeling mixed up (confused).  Being tired and grouchy (irritable).  A fever.  Watery poop (diarrhea). How is this treated? This condition may be treated with:  Antibiotic medicine.  Other medicines.  Drinking enough water. Follow these instructions at home:  Medicines  Take over-the-counter and prescription medicines only as told by your doctor.  If you were prescribed an antibiotic medicine, take it as told by your doctor. Do not stop taking it even if you start to feel better. General instructions  Make sure you: ? Pee until your bladder is empty. ? Do not hold pee for a long time. ? Empty your bladder after sex. ? Wipe from front to back after pooping if you are a female. Use each tissue one time when you wipe.  Drink enough fluid to keep your pee pale yellow.  Keep all follow-up visits as told by your doctor. This is important. Contact a doctor if:  You do not get better after 1-2 days.  Your symptoms go away and then come back. Get help right away if:  You have very bad back pain.  You have very bad pain in your lower belly.  You have a fever.  You are sick to your stomach (nauseous).  You are throwing up. Summary  A urinary tract infection (UTI) is an  infection of any part of the urinary tract.  This condition is caused by germs in your genital area.  There are many risk factors for a UTI. These include having a small, thin tube to drain pee and not being able to control when you pee or poop.  Treatment includes antibiotic medicines for germs.  Drink enough fluid to keep your pee pale yellow. This information is not intended to replace advice given to you by your health care provider. Make sure you discuss any questions you have with your health care provider. Document Revised: 03/07/2018 Document Reviewed:  09/27/2017 Elsevier Patient Education  2020 [employer].

## 2019-09-17 NOTE — Progress Notes (Signed)
   Subjective:    Patient ID: Peggy Hicks, female    DOB: 12-22-1965, 54 y.o.   MRN: [id number]  HPI Chief Complaint  Patient presents with  . Urinary Tract Infection    Increased pain with urination, hematuria. Left flank pain   Symptoms started yesterday. Burning with urination. No fever. No nausea.  Some hematuria this morning.  Good water intake.  Has not taken anything for symptoms.  Has had UTI in the distant past.  Similar symptoms. She had GYN visit 2 days ago with pelvic exam.   Review of Systems Per HPI    Objective:   Physical Exam  Physical Exam  Constitutional: She is oriented to person, place, and time. She appears well-developed and well-nourished. No distress.  HENT:  Head: Normocephalic and atraumatic.  Cardiovascular: Normal rate, regular rhythm and normal heart sounds.   Pulmonary/Chest: Effort normal and breath sounds normal.  Abdominal: Soft. She exhibits no distension. There is no tenderness. There is no rebound, no guarding and no CVA tenderness.  Neurological: She is alert and oriented to person, place, and time.  Skin: Skin is warm and dry. She is not diaphoretic.  Psychiatric: She has a normal mood and affect. Her behavior is normal. Judgment and thought content normal.  Vitals reviewed.    BP (!) 148/82 (BP Location: Left Arm, Patient Position: Sitting, Cuff Size: Normal)   Pulse 74   Temp 97.8 F (36.6 C) (Temporal)   Ht 5' 5.5" (1.664 m)   Wt 190 lb 12.8 oz (86.5 kg)   LMP 03/03/2018 (Approximate)   SpO2 99%   BMI 31.27 kg/m  Wt Readings from Last 3 Encounters:  09/17/19 190 lb 12.8 oz (86.5 kg)  06/18/19 183 lb 5 oz (83.2 kg)  06/12/19 185 lb (83.9 kg)   Results for orders placed or performed in visit on 09/17/19  Urinalysis Dipstick  Result Value Ref Range   Color, UA amber    Clarity, UA cloudy    Glucose, UA Negative Negative   Bilirubin, UA neg    Ketones, UA neg    Spec Grav, UA 1.020 1.010 - 1.025   Blood, UA 3+    pH, UA  6.0 5.0 - 8.0   Protein, UA Positive (A) Negative   Urobilinogen, UA 0.2 0.2 or 1.0 E.U./dL   Nitrite, UA neg    Leukocytes, UA Large (3+) (A) Negative   Appearance     Odor         Assessment & Plan:  1. Hematuria, unspecified type - Urinalysis Dipstick - Urine Culture  2. Pain with urination - Urinalysis Dipstick - Urine Culture  3. Cystitis - Provided written and verbal information regarding diagnosis and treatment. - RTC/ER/UC precautions reviewed - nitrofurantoin, macrocrystal-monohydrate, (MACROBID) 100 MG capsule; Take 1 capsule (100 mg total) by mouth 2 (two) times daily for 7 days.  Dispense: 14 capsule; Refill: 0 - Urine Culture   Clarene Reamer, FNP-BC  [facility]  09/18/2019 7:40 AM

## 2019-09-19 ENCOUNTER — Encounter: Payer: Self-pay | Admitting: Family Medicine

## 2019-09-19 MED ORDER — PHENAZOPYRIDINE HCL 100 MG PO TABS: 100.0000 mg | ORAL_TABLET | Freq: Three times a day (TID) | ORAL | 0 refills | Status: DC | PRN

## 2019-09-19 NOTE — Telephone Encounter (Signed)
FYI- I know that the final cx report is pending Thanks for seeing her  I am out next week

## 2019-09-21 LAB — URINE CULTURE

## 2019-09-21 MED ORDER — SULFAMETHOXAZOLE-TRIMETHOPRIM 800-160 MG PO TABS: 1.0000 | ORAL_TABLET | Freq: Two times a day (BID) | ORAL | 0 refills | Status: AC

## 2019-09-21 NOTE — Addendum Note (Signed)
Addended by: Clarene Reamer B on: 09/21/2019 01:10 PM   Modules accepted: Orders

## 2019-09-26 ENCOUNTER — Encounter: Payer: Self-pay | Admitting: Family Medicine

## 2019-09-26 NOTE — Telephone Encounter (Signed)
Routing to provider who recently treated UTI

## 2019-11-07 ENCOUNTER — Encounter: Payer: Self-pay | Admitting: Family Medicine

## 2019-12-17 ENCOUNTER — Ambulatory Visit: Payer: Managed Care, Other (non HMO) | Admitting: Obstetrics and Gynecology

## 2019-12-18 ENCOUNTER — Other Ambulatory Visit: Payer: Self-pay

## 2019-12-18 ENCOUNTER — Encounter: Payer: Self-pay | Admitting: Obstetrics and Gynecology

## 2019-12-18 ENCOUNTER — Ambulatory Visit: Payer: Managed Care, Other (non HMO) | Admitting: Obstetrics and Gynecology

## 2019-12-18 VITALS — BP 124/80

## 2019-12-18 DIAGNOSIS — N905 Atrophy of vulva: Secondary | ICD-10-CM | POA: Diagnosis not present

## 2019-12-18 DIAGNOSIS — R309 Painful micturition, unspecified: Secondary | ICD-10-CM

## 2019-12-18 DIAGNOSIS — R300 Dysuria: Secondary | ICD-10-CM

## 2019-12-18 DIAGNOSIS — Z23 Encounter for immunization: Secondary | ICD-10-CM | POA: Diagnosis not present

## 2019-12-18 LAB — URINALYSIS, COMPLETE W/RFL CULTURE
Bacteria, UA: NONE SEEN /HPF
Bilirubin Urine: NEGATIVE
Glucose, UA: NEGATIVE
Hgb urine dipstick: NEGATIVE
Hyaline Cast: NONE SEEN /LPF
Ketones, ur: NEGATIVE
Leukocyte Esterase: NEGATIVE
Nitrites, Initial: NEGATIVE
Protein, ur: NEGATIVE
RBC / HPF: NONE SEEN /HPF (ref 0–2)
Specific Gravity, Urine: 1.015 (ref 1.001–1.03)
WBC, UA: NONE SEEN /HPF (ref 0–5)
pH: 7 (ref 5.0–8.0)

## 2019-12-18 LAB — WET PREP FOR TRICH, YEAST, CLUE

## 2019-12-18 LAB — NO CULTURE INDICATED

## 2019-12-18 MED ORDER — ESTRADIOL 0.1 MG/GM VA CREA: 1.0000 | TOPICAL_CREAM | Freq: Every day | VAGINAL | 12 refills | Status: DC

## 2019-12-18 NOTE — Progress Notes (Signed)
DEASHA CLENDENIN October 27, 1965 [id number]  SUBJECTIVE:  54 y.o. G2P2002 female presents for dysuria symptoms.  She had a UTI with Klebsiella diagnosed at her PCP office on 09/17/2019 she was treated with Bactrim after found to be intermittently susceptible to nitrofurantoin.  Currently she is mostly noticing burning after urination.  Not specifically increased urinary frequency.  No vaginal bleeding or discharge.  She has more or less has noticed the symptoms since the hysterectomy last year.  She did retain her ovaries.  Current Outpatient Medications  Medication Sig Dispense Refill  . ALPRAZolam (XANAX) 0.5 MG tablet Take 1 tablet (0.5 mg total) by mouth daily as needed for anxiety. For traveling 30 tablet 0  . cetirizine (ZYRTEC) 10 MG tablet Take 10 mg by mouth daily as needed for allergies.    Marland Kitchen ELDERBERRY PO Take by mouth. Takes occassionally    . fluticasone (FLONASE) 50 MCG/ACT nasal spray Place 1 spray into both nostrils daily as needed for allergies or rhinitis.    . Lactobacillus (DIGESTIVE HEALTH PROBIOTIC) CAPS Take 1 capsule by mouth daily.    . naproxen sodium (ALEVE) 220 MG tablet Take 440 mg by mouth daily as needed (pain).    . Vitamin D, Cholecalciferol, 25 MCG (1000 UT) CAPS Take by mouth. Takes occasionally    . busPIRone (BUSPAR) 15 MG tablet Take 0.5 tablets (7.5 mg total) by mouth 2 (two) times daily. (Patient not taking: Reported on 12/18/2019) 30 tablet 5  . famotidine (PEPCID) 40 MG tablet Take 1 tablet (40 mg total) by mouth daily. (Patient not taking: Reported on 12/18/2019) 30 tablet 3  . phenazopyridine (PYRIDIUM) 100 MG tablet Take 1 tablet (100 mg total) by mouth 3 (three) times daily as needed for pain (urinary pain). Take with food (Patient not taking: Reported on 12/18/2019) 20 tablet 0   No current facility-administered medications for this visit.   Allergies: Patient has no known allergies.  Patient's last menstrual period was 03/03/2018 (approximate).  Past  medical history,surgical history, problem list, medications, allergies, family history and social history were all reviewed and documented as reviewed in the EPIC chart.  ROS: Pertinent positives and negatives as reviewed in HPI    OBJECTIVE:  BP 124/80   LMP 03/03/2018 (Approximate)  The patient appears well, alert, oriented x 3, in no distress. PELVIC EXAM: VULVA: normal appearing vulva with atrophic changes, no masses, tenderness or lesions, VAGINA: normal appearing vagina with atrophic changes, normal color and discharge, no lesions, CERVIX: normal appearing cervix without discharge or lesions, surgically absent, WET MOUNT done - results: negative for pathogens, normal epithelial cells Urinalysis unremarkable  Chaperone: Aurora Mask (DNP student) present during the examination and performed the pelvic exam with me in attendance to confirm the exam findings   ASSESSMENT:  54 y.o. 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  We discussed that her symptoms that have been ongoing since surgery are probably likely due to menopausal transition reduction in estrogen levels and vulvovaginal atrophy.  We did use a mirror during the exam today to show her the atrophic changes in the periurethral area.  We discussed topical estrogen therapy with Estrace 0.1 mg/g vaginal cream, risks of systemic absorption to include thrombotic diseases.  Breast cancer and endometrial cancer risks are low with low-dose use as prescribed.  Some potential for systemic absorption and associated side effects such as breast tenderness and hot flashes with withdrawal.  If too expensive then we can always do the custom compound pharmacy cream instead.  Follow-up if any problems.   Joseph Pierini MD 12/18/19

## 2019-12-22 ENCOUNTER — Encounter: Payer: Self-pay | Admitting: Obstetrics and Gynecology

## 2019-12-22 DIAGNOSIS — R309 Painful micturition, unspecified: Secondary | ICD-10-CM

## 2019-12-22 DIAGNOSIS — R300 Dysuria: Secondary | ICD-10-CM

## 2019-12-22 MED ORDER — ESTRADIOL 0.1 MG/GM VA CREA: 1.0000 | TOPICAL_CREAM | Freq: Every day | VAGINAL | 12 refills | Status: DC

## 2020-01-20 ENCOUNTER — Other Ambulatory Visit: Payer: Self-pay

## 2020-01-20 ENCOUNTER — Ambulatory Visit (INDEPENDENT_AMBULATORY_CARE_PROVIDER_SITE_OTHER): Payer: Managed Care, Other (non HMO) | Admitting: Family Medicine

## 2020-01-20 ENCOUNTER — Encounter: Payer: Self-pay | Admitting: Family Medicine

## 2020-01-20 DIAGNOSIS — E669 Obesity, unspecified: Secondary | ICD-10-CM | POA: Diagnosis not present

## 2020-01-20 NOTE — Assessment & Plan Note (Signed)
BMI over 30  Possible stress eater Got out of the exercise habit after surgery and then during the pandemic Plan made to return to that 1 h per day for 5 d per week when able  Also to replace processed carbs with produce She failed intermittent fasting in the past Disc other programs like NOOm and ww  Will ref to healthy wt and wellness clinic

## 2020-01-20 NOTE — Progress Notes (Signed)
Subjective:    Patient ID: Peggy Hicks, female    DOB: [birth year], 54 y.o.   MRN: [id number]  This visit occurred during the SARS-CoV-2 public health emergency.  Safety protocols were in place, including screening questions prior to the visit, additional usage of staff PPE, and extensive cleaning of exam room while observing appropriate contact time as indicated for disinfecting solutions.    HPI Pt presents to complete and appeal form for work   [employer] from Last 3 Encounters:  01/20/20 186 lb 9 oz (84.6 kg)  09/17/19 190 lb 12.8 oz (86.5 kg)  06/18/19 183 lb 5 oz (83.2 kg)   30.57 kg/m  The form is for a reasonable alt for reaching BMI in the non obese range   Pt has had weight issues ever since her hysterectomy /menopause   bkfast- eggs/bacon Salad for lunch  Light dinner  She gets bloated when she eats  Works out (did boot camp until the pandemic)-really misses that  So she gets on treadmill for 30 minutes and adds a friend's work out regimen 30 min every other day   Felt better when she worked out an hour per day  Thinks she can fit it in  Can do M-F   Drinks at least 5 bottles of water per day  Cannot seem to get diet on track   Did intermittent fasting (she ate from 9 to 6) - did not loose any weight  Stuck around 190  Did not eat more from 9 to 6 than usual  Has never counted calories   Does eat out quite a bit  Japanese/chinese food quite a bit   Is interested in the healthy weight and wellness clinic   Family hx  Her mother gained wt after 50  No other particular obesity issues   BP is up this am BP Readings from Last 3 Encounters:  01/20/20 136/90  12/18/19 124/80  09/17/19 (!) 148/82     Patient Active Problem List   Diagnosis Date Noted  . Obesity (BMI 30-39.9) 01/20/2020  . Cerumen impaction 06/18/2019  . Nasal discomfort 04/23/2019  . Dyspnea on exertion 01/01/2019  . Globus sensation 01/01/2019  . Left knee pain 10/03/2018   . Daily headache 06/29/2017  . Hx of adenomatous colonic polyps 04/14/2014  . Routine general medical examination at a health care facility 01/01/2012  . Stress reaction 11/13/2011  . Prediabetes 11/15/2010  . PRIMARY FOCAL HYPERHIDROSIS 10/22/2007  . TMJ (dislocation of temporomandibular joint) 01/25/2007  . Generalized anxiety disorder 08/01/2006   Past Medical History:  Diagnosis Date  . Allergy   . Arthritis    knee  . Fibroids   . GERD (gastroesophageal reflux disease)   . Heart valve regurgitation 2020  . Hx of adenomatous colonic polyps 04/14/2014  . Hyperhidrosis   . Stress reaction    Past Surgical History:  Procedure Laterality Date  . BREAST BIOPSY    . BREAST SURGERY     Breast Reduction  . CESAREAN SECTION      X 2  . COLONOSCOPY    . HYSTERECTOMY ABDOMINAL WITH SALPINGECTOMY Bilateral 06/04/2018   Procedure: HYSTERECTOMY ABDOMINAL WITH SALPINGECTOMY;  Surgeon: Anastasio Auerbach, MD;  Location: [place];  Service: Gynecology;  Laterality: Bilateral;  . HYSTEROSCOPY    . MYOMECTOMY     Uterine fibroids removed/ hysteroscopic submucous myomectomy  . POLYPECTOMY    . TUBAL LIGATION     Social History   Tobacco  Use  . Smoking status: Never Smoker  . Smokeless tobacco: Never Used  Vaping Use  . Vaping Use: Never used  Substance Use Topics  . Alcohol use: Yes    Alcohol/week: 0.0 standard drinks    Comment: Rare  . Drug use: No   Family History  Problem Relation Age of Onset  . Hypertension Mother   . Heart disease Mother   . Diabetes Mother   . Colon polyps Mother   . Hypertension Father   . Heart disease Father   . Cancer Maternal Aunt        liver  . Colon cancer Neg Hx   . Esophageal cancer Neg Hx   . Rectal cancer Neg Hx   . Stomach cancer Neg Hx    No Known Allergies Current Outpatient Medications on File Prior to Visit  Medication Sig Dispense Refill  . ALPRAZolam (XANAX) 0.5 MG tablet Take 1 tablet (0.5 mg total) by  mouth daily as needed for anxiety. For traveling 30 tablet 0  . busPIRone (BUSPAR) 15 MG tablet Take 0.5 tablets (7.5 mg total) by mouth 2 (two) times daily. 30 tablet 5  . cetirizine (ZYRTEC) 10 MG tablet Take 10 mg by mouth daily as needed for allergies.    Marland Kitchen ELDERBERRY PO Take by mouth. Takes occassionally    . estradiol (ESTRACE VAGINAL) 0.1 MG/GM vaginal cream Place 1 Applicatorful vaginally at bedtime. Apply nightly x2 weeks, then every other night x2 weeks, then 2 nights per week for at least 3 months 42.5 g 12  . famotidine (PEPCID) 40 MG tablet Take 1 tablet (40 mg total) by mouth daily. 30 tablet 3  . fluticasone (FLONASE) 50 MCG/ACT nasal spray Place 1 spray into both nostrils daily as needed for allergies or rhinitis.    . Lactobacillus (DIGESTIVE HEALTH PROBIOTIC) CAPS Take 1 capsule by mouth daily.    . naproxen sodium (ALEVE) 220 MG tablet Take 440 mg by mouth daily as needed (pain).    . phenazopyridine (PYRIDIUM) 100 MG tablet Take 1 tablet (100 mg total) by mouth 3 (three) times daily as needed for pain (urinary pain). Take with food 20 tablet 0  . Vitamin D, Cholecalciferol, 25 MCG (1000 UT) CAPS Take by mouth. Takes occasionally     No current facility-administered medications on file prior to visit.    Review of Systems  Constitutional: Negative for activity change, appetite change, fatigue, fever and unexpected weight change.  HENT: Negative for congestion, ear pain, rhinorrhea, sinus pressure and sore throat.   Eyes: Negative for pain, redness and visual disturbance.  Respiratory: Negative for cough, shortness of breath and wheezing.   Cardiovascular: Negative for chest pain and palpitations.  Gastrointestinal: Negative for abdominal pain, blood in stool, constipation and diarrhea.  Endocrine: Negative for polydipsia and polyuria.  Genitourinary: Negative for dysuria, frequency and urgency.  Musculoskeletal: Negative for arthralgias, back pain and myalgias.  Skin:  Negative for pallor and rash.  Allergic/Immunologic: Negative for environmental allergies.  Neurological: Negative for dizziness, syncope and headaches.  Hematological: Negative for adenopathy. Does not bruise/bleed easily.  Psychiatric/Behavioral: Negative for decreased concentration and dysphoric mood. The patient is not nervous/anxious.        Objective:   Physical Exam Constitutional:      General: She is not in acute distress.    Appearance: Normal appearance. She is well-developed. She is obese. She is not ill-appearing.  HENT:     Head: Normocephalic and atraumatic.  Eyes:  Conjunctiva/sclera: Conjunctivae normal.     Pupils: Pupils are equal, round, and reactive to light.  Neck:     Thyroid: No thyromegaly.     Vascular: No carotid bruit or JVD.  Cardiovascular:     Rate and Rhythm: Normal rate and regular rhythm.     Heart sounds: Normal heart sounds. No gallop.   Pulmonary:     Effort: Pulmonary effort is normal. No respiratory distress.     Breath sounds: Normal breath sounds. No wheezing or rales.  Abdominal:     General: Bowel sounds are normal. There is no distension or abdominal bruit.     Palpations: Abdomen is soft. There is no mass.     Tenderness: There is no abdominal tenderness.  Musculoskeletal:     Cervical back: Normal range of motion and neck supple.     Comments: Medium frame  Lymphadenopathy:     Cervical: No cervical adenopathy.  Skin:    General: Skin is warm and dry.     Findings: No rash.  Neurological:     Mental Status: She is alert.     Coordination: Coordination normal.     Deep Tendon Reflexes: Reflexes are normal and symmetric. Reflexes normal.  Psychiatric:        Mood and Affect: Mood normal.           Assessment & Plan:   Problem List Items Addressed This Visit      Other   Obesity (BMI 30-39.9)    BMI over 30  Possible stress eater Got out of the exercise habit after surgery and then during the pandemic Plan made  to return to that 1 h per day for 5 d per week when able  Also to replace processed carbs with produce She failed intermittent fasting in the past Disc other programs like NOOm and ww  Will ref to healthy wt and wellness clinic      Relevant Orders   Amb Ref to Medical Weight Management

## 2020-01-20 NOTE — Patient Instructions (Addendum)
For weight loss : Try to get most of your carbohydrates from produce (with the exception of white potatoes)  Eat less bread/pasta/rice/snack foods/cereals/sweets and other items from the middle of the grocery store (processed carbs)   I will place a referral to the healthy [facility] with [facility]   Noom and weight watchers on line are also good programs   Increase your exercise

## 2020-02-03 ENCOUNTER — Other Ambulatory Visit: Payer: Self-pay

## 2020-02-03 ENCOUNTER — Ambulatory Visit (INDEPENDENT_AMBULATORY_CARE_PROVIDER_SITE_OTHER): Payer: Managed Care, Other (non HMO)

## 2020-02-03 DIAGNOSIS — I340 Nonrheumatic mitral (valve) insufficiency: Secondary | ICD-10-CM | POA: Diagnosis not present

## 2020-02-03 LAB — ECHOCARDIOGRAM COMPLETE
Area-P 1/2: 2.41 cm2
S' Lateral: 2.6 cm

## 2020-02-05 ENCOUNTER — Encounter: Payer: Self-pay | Admitting: Family Medicine

## 2020-02-06 ENCOUNTER — Encounter: Payer: Self-pay | Admitting: Family Medicine

## 2020-02-06 NOTE — Telephone Encounter (Signed)
Printed and in IN box if she needs a paper copy

## 2020-03-08 ENCOUNTER — Encounter: Payer: Self-pay | Admitting: Cardiology

## 2020-03-08 ENCOUNTER — Ambulatory Visit: Payer: Managed Care, Other (non HMO) | Admitting: Cardiology

## 2020-03-08 ENCOUNTER — Other Ambulatory Visit: Payer: Self-pay

## 2020-03-08 VITALS — BP 130/86 | HR 60 | Ht 65.5 in | Wt 191.0 lb

## 2020-03-08 DIAGNOSIS — I340 Nonrheumatic mitral (valve) insufficiency: Secondary | ICD-10-CM | POA: Diagnosis not present

## 2020-03-08 NOTE — Progress Notes (Signed)
Cardiology Office Note:    Date:  03/08/2020   ID:  Peggy Hicks, Peggy Hicks 03/18/1966, MRN [id number]  PCP:  Abner Greenspan, MD  Cardiologist:  Kate Sable, MD  Electrophysiologist:  None   Referring MD: Abner Greenspan, MD   Chief Complaint  Patient presents with  . Annual Exam    Pt states no new Sx.    History of Present Illness:    Peggy Hicks is a 54 y.o. female with a hx of fibroids, mild to moderate mitral regurgitation who presents for follow-up.  Previously seen for dyspnea on exertion, echo showed mild to moderate MR, Lexiscan without any evidence for ischemia.  Repeat echocardiogram to evaluate progression of MR was scheduled 1 year later which patient obtained on 02/03/2020.  Has no concerns at this time.  Denies chest pain or shortness of breath.  Feels okay  Prior notes Echo 02/26/2019 EF 55-60, moderate MR. Lexiscan Myoview 02/2019, no evidence for ischemia, low risk study  Past Medical History:  Diagnosis Date  . Allergy   . Arthritis    knee  . Fibroids   . GERD (gastroesophageal reflux disease)   . Heart valve regurgitation 2020  . Hx of adenomatous colonic polyps 04/14/2014  . Hyperhidrosis   . Stress reaction     Past Surgical History:  Procedure Laterality Date  . BREAST BIOPSY    . BREAST SURGERY     Breast Reduction  . CESAREAN SECTION      X 2  . COLONOSCOPY    . HYSTERECTOMY ABDOMINAL WITH SALPINGECTOMY Bilateral 06/04/2018   Procedure: HYSTERECTOMY ABDOMINAL WITH SALPINGECTOMY;  Surgeon: Anastasio Auerbach, MD;  Location: [place];  Service: Gynecology;  Laterality: Bilateral;  . HYSTEROSCOPY    . MYOMECTOMY     Uterine fibroids removed/ hysteroscopic submucous myomectomy  . POLYPECTOMY    . TUBAL LIGATION      Current Medications: Current Meds  Medication Sig  . ALPRAZolam (XANAX) 0.5 MG tablet Take 1 tablet (0.5 mg total) by mouth daily as needed for anxiety. For traveling  . cetirizine (ZYRTEC) 10 MG tablet  Take 10 mg by mouth daily as needed for allergies.  Marland Kitchen ELDERBERRY PO Take by mouth. Takes occassionally  . estradiol (ESTRACE VAGINAL) 0.1 MG/GM vaginal cream Place 1 Applicatorful vaginally at bedtime. Apply nightly x2 weeks, then every other night x2 weeks, then 2 nights per week for at least 3 months  . fluticasone (FLONASE) 50 MCG/ACT nasal spray Place 1 spray into both nostrils daily as needed for allergies or rhinitis.  . Lactobacillus (DIGESTIVE HEALTH PROBIOTIC) CAPS Take 1 capsule by mouth daily.  . naproxen sodium (ALEVE) 220 MG tablet Take 440 mg by mouth daily as needed (pain).  . Vitamin D, Cholecalciferol, 25 MCG (1000 UT) CAPS Take by mouth. Takes occasionally     Allergies:   Patient has no known allergies.   Social History   Socioeconomic History  . Marital status: Married    Spouse name: Not on file  . Number of children: Not on file  . Years of education: Not on file  . Highest education level: Not on file  Occupational History  . Not on file  Tobacco Use  . Smoking status: Never Smoker  . Smokeless tobacco: Never Used  Vaping Use  . Vaping Use: Never used  Substance and Sexual Activity  . Alcohol use: Yes    Alcohol/week: 0.0 standard drinks    Comment: Rare  .  Drug use: No  . Sexual activity: Yes    Birth control/protection: Surgical    Comment: Tubal lig-1st intercourse 54 yo--Fewer than 5 partners  Other Topics Concern  . Not on file  Social History Narrative   Exercises regularly   Social Determinants of Health   Financial Resource Strain:   . Difficulty of Paying Living Expenses: Not on file  Food Insecurity:   . Worried About Charity fundraiser in the Last Year: Not on file  . Ran Out of Food in the Last Year: Not on file  Transportation Needs:   . Lack of Transportation (Medical): Not on file  . Lack of Transportation (Non-Medical): Not on file  Physical Activity:   . Days of Exercise per Week: Not on file  . Minutes of Exercise per  Session: Not on file  Stress:   . Feeling of Stress : Not on file  Social Connections:   . Frequency of Communication with Friends and Family: Not on file  . Frequency of Social Gatherings with Friends and Family: Not on file  . Attends Religious Services: Not on file  . Active Member of Clubs or Organizations: Not on file  . Attends Archivist Meetings: Not on file  . Marital Status: Not on file     Family History: The patient's family history includes Cancer in her maternal aunt; Colon polyps in her mother; Diabetes in her mother; Heart disease in her father and mother; Hypertension in her father and mother. There is no history of Colon cancer, Esophageal cancer, Rectal cancer, or Stomach cancer.  ROS:   Please see the history of present illness.     All other systems reviewed and are negative.  EKGs/Labs/Other Studies Reviewed:    The following studies were reviewed today:    EKG:  EKG is ordered today.  EKG shows normal sinus rhythm, possible LAE Recent Labs: 06/16/2019: ALT 10; BUN 14; Creatinine, Ser 0.86; Hemoglobin 13.6; Platelets 283; Potassium 4.8; Sodium 142; TSH 2.380  Recent Lipid Panel    Component Value Date/Time   CHOL 204 (H) 06/16/2019 0759   TRIG 67 06/16/2019 0759   HDL 81 06/16/2019 0759   CHOLHDL 2.5 06/16/2019 0759   CHOLHDL 2.9 05/21/2012 1432   VLDL 27 05/21/2012 1432   LDLCALC 111 (H) 06/16/2019 0759    Physical Exam:    VS:  BP 130/86   Pulse 60   Ht 5' 5.5" (1.664 m)   Wt 191 lb (86.6 kg)   LMP 03/03/2018 (Approximate)   BMI 31.30 kg/m     Wt Readings from Last 3 Encounters:  03/08/20 191 lb (86.6 kg)  01/20/20 186 lb 9 oz (84.6 kg)  09/17/19 190 lb 12.8 oz (86.5 kg)     GEN:  Well nourished, well developed in no acute distress HEENT: Normal NECK: No JVD; No carotid bruits LYMPHATICS: No lymphadenopathy CARDIAC: RRR, no murmurs, rubs, gallops RESPIRATORY:  Clear to auscultation without rales, wheezing or rhonchi   ABDOMEN: Soft, non-tender, non-distended MUSCULOSKELETAL:  No edema; No deformity  SKIN: Warm and dry NEUROLOGIC:  Alert and oriented x 3 PSYCHIATRIC:  Normal affect   ASSESSMENT:    1. Mitral valve insufficiency, unspecified etiology      PLAN:    1.  Mild to moderate MR.  Repeat echo 02/2020 showed normal systolic function, impaired relaxation, EF 60 to 65%, mild to moderate MR, no significant difference from prior.  Follow-up in about 1 -2years.  Get repeat echo  prior to follow-up to evaluate MR progression.    This note was generated in part or whole with voice recognition software. Voice recognition is usually quite accurate but there are transcription errors that can and very often do occur. I apologize for any typographical errors that were not detected and corrected.  Medication Adjustments/Labs and Tests Ordered: Current medicines are reviewed at length with the patient today.  Concerns regarding medicines are outlined above.  Orders Placed This Encounter  Procedures  . EKG 12-Lead  . ECHOCARDIOGRAM COMPLETE   No orders of the defined types were placed in this encounter.   Patient Instructions  Medication Instructions:  Your physician recommends that you continue on your current medications as directed. Please refer to the Current Medication list given to you today.  *If you need a refill on your cardiac medications before your next appointment, please call your pharmacy*   Lab Work: None Ordered If you have labs (blood work) drawn today and your tests are completely normal, you will receive your results only by: Marland Kitchen MyChart Message (if you have MyChart) OR . A paper copy in the mail If you have any lab test that is abnormal or we need to change your treatment, we will call you to review the results.   Testing/Procedures:  Your physician has requested that you have an echocardiogram just prior to your follow up in 12 months. Echocardiography is a painless test  that uses sound waves to create images of your heart. It provides your doctor with information about the size and shape of your heart and how well your heart's chambers and valves are working. This procedure takes approximately one hour. There are no restrictions for this procedure.    Follow-Up: At [facility], you and your health needs are our priority.  As part of our continuing mission to provide you with exceptional heart care, we have created designated Provider Care Teams.  These Care Teams include your primary Cardiologist (physician) and Advanced Practice Providers (APPs -  Physician Assistants and Nurse Practitioners) who all work together to provide you with the care you need, when you need it.  We recommend signing up for the patient portal called "MyChart".  Sign up information is provided on this After Visit Summary.  MyChart is used to connect with patients for Virtual Visits (Telemedicine).  Patients are able to view lab/test results, encounter notes, upcoming appointments, etc.  Non-urgent messages can be sent to your provider as well.   To learn more about what you can do with MyChart, go to [email or web address].    Your next appointment:   12-18 month(s)  The format for your next appointment:   In Person  Provider:   Kate Sable, MD   Other Instructions      Signed, Kate Sable, MD  03/08/2020 4:52 PM    [place]

## 2020-03-08 NOTE — Patient Instructions (Signed)
Medication Instructions:  Your physician recommends that you continue on your current medications as directed. Please refer to the Current Medication list given to you today.  *If you need a refill on your cardiac medications before your next appointment, please call your pharmacy*   Lab Work: None Ordered If you have labs (blood work) drawn today and your tests are completely normal, you will receive your results only by: Marland Kitchen MyChart Message (if you have MyChart) OR . A paper copy in the mail If you have any lab test that is abnormal or we need to change your treatment, we will call you to review the results.   Testing/Procedures:  Your physician has requested that you have an echocardiogram just prior to your follow up in 12 months. Echocardiography is a painless test that uses sound waves to create images of your heart. It provides your doctor with information about the size and shape of your heart and how well your heart's chambers and valves are working. This procedure takes approximately one hour. There are no restrictions for this procedure.    Follow-Up: At [facility], you and your health needs are our priority.  As part of our continuing mission to provide you with exceptional heart care, we have created designated Provider Care Teams.  These Care Teams include your primary Cardiologist (physician) and Advanced Practice Providers (APPs -  Physician Assistants and Nurse Practitioners) who all work together to provide you with the care you need, when you need it.  We recommend signing up for the patient portal called "MyChart".  Sign up information is provided on this After Visit Summary.  MyChart is used to connect with patients for Virtual Visits (Telemedicine).  Patients are able to view lab/test results, encounter notes, upcoming appointments, etc.  Non-urgent messages can be sent to your provider as well.   To learn more about what you can do with MyChart, go to  [email or web address].    Your next appointment:   12-18 month(s)  The format for your next appointment:   In Person  Provider:   Kate Sable, MD   Other Instructions

## 2020-03-21 IMAGING — DX LEFT KNEE - COMPLETE 4+ VIEW
4 series · 4 of 4 positions shown · non-contrast
Comparison: None

CLINICAL DATA: Anterior and medial LEFT knee pain after doing
lunges in [employer], some swelling

EXAM:
LEFT KNEE - COMPLETE 4+ VIEW

[knee ap]
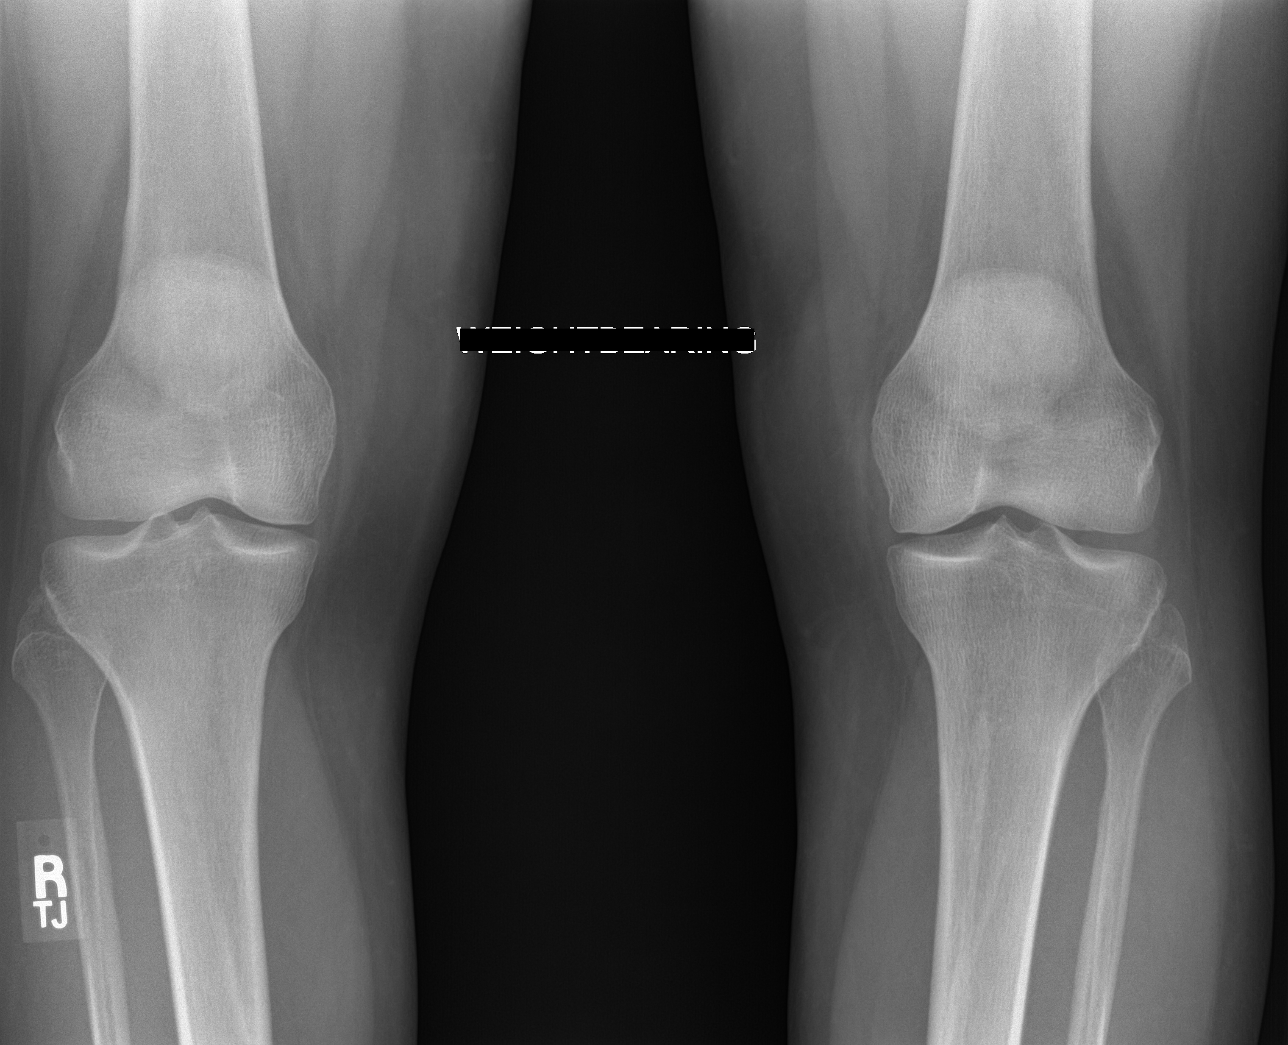

[knee tunnel]
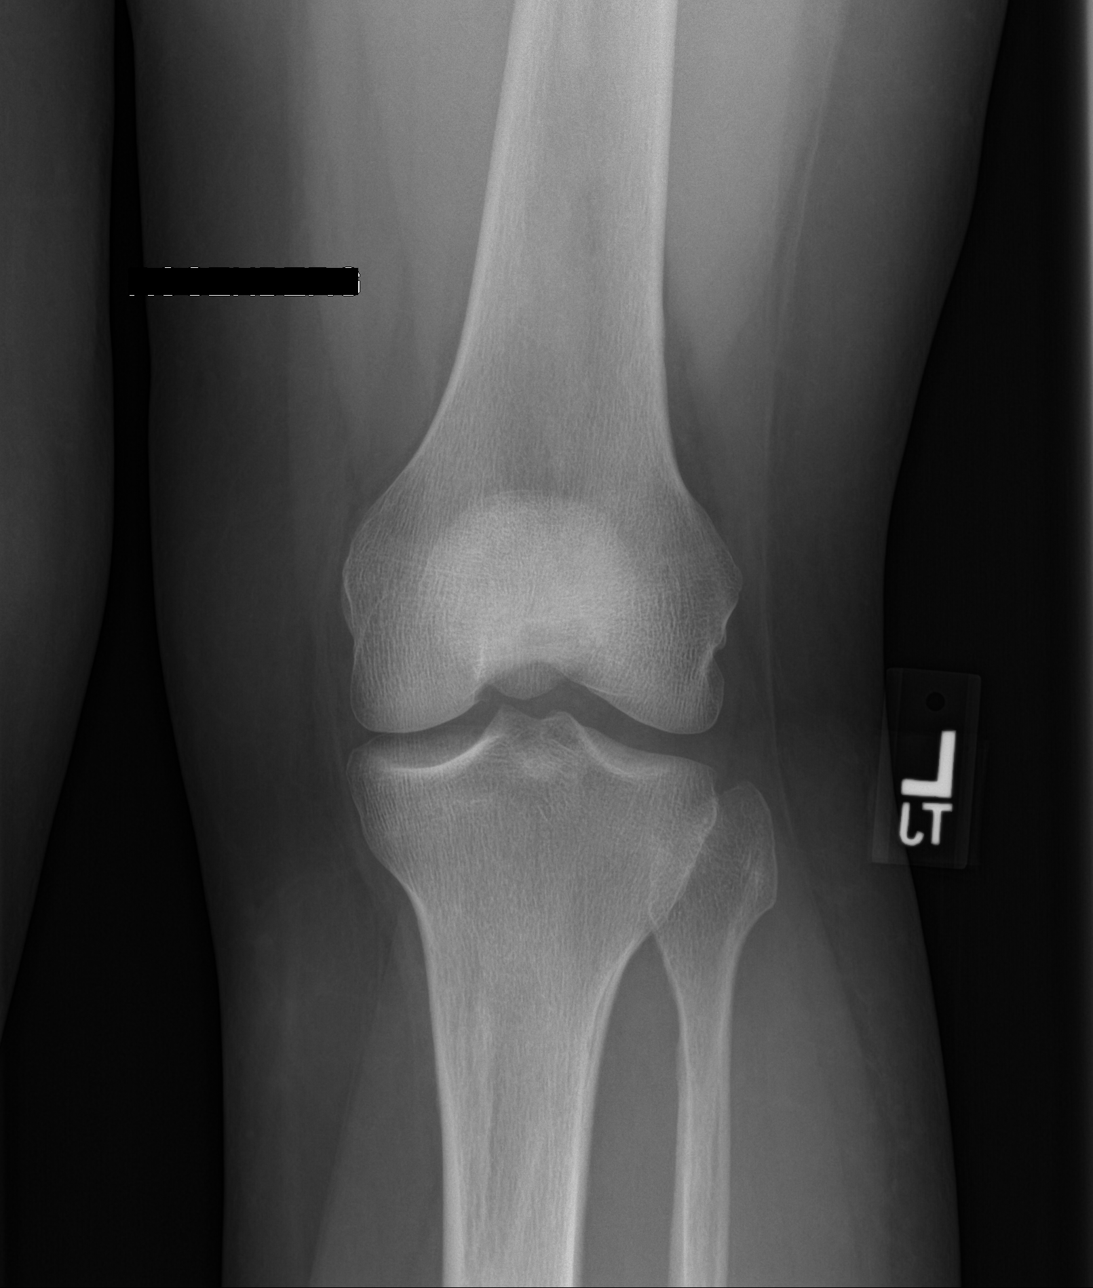

[knee lat]
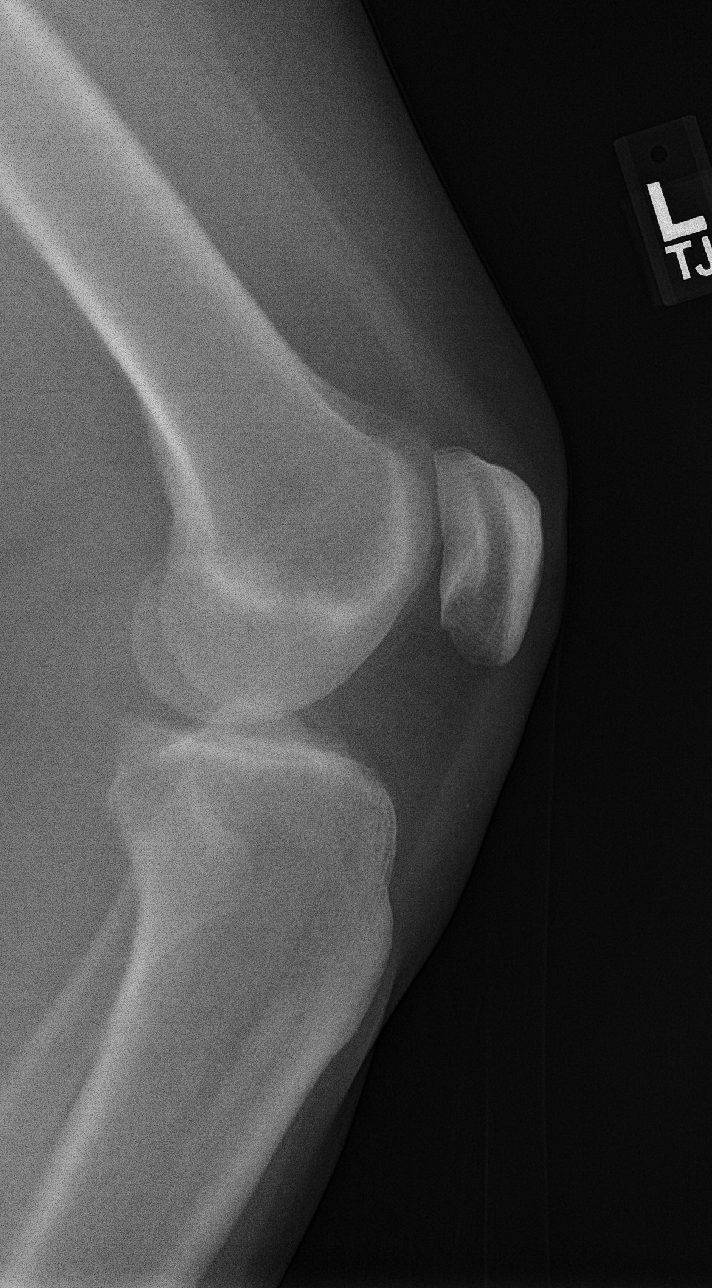

[patella skyline]
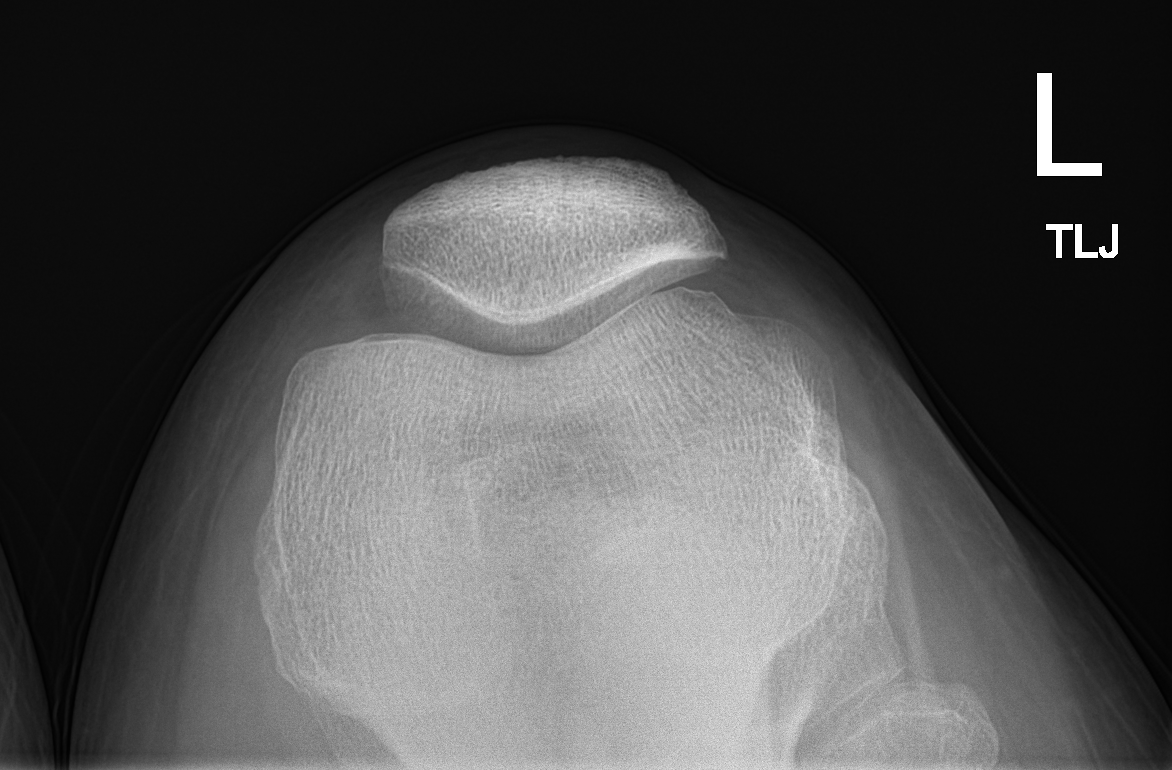

[4 of 4 positions shown; findings below may reference images not displayed]

FINDINGS: Osseous mineralization normal.

Minimal medial compartment joint space narrowing.

Remaining joint spaces preserved.

No acute fracture, dislocation, or bone destruction.

No joint effusion.

Minimal narrowing is also seen at the medial compartment of the
RIGHT knee on comparison AP view.
IMPRESSION: Minimal degenerative changes of LEFT knee without acute bony
abnormalities.

## 2020-04-04 ENCOUNTER — Other Ambulatory Visit: Payer: Self-pay | Admitting: Family Medicine

## 2020-04-15 ENCOUNTER — Telehealth: Payer: Self-pay | Admitting: Family Medicine

## 2020-04-15 NOTE — Telephone Encounter (Signed)
It's a little early, have her call 2 wk before appt to remind me to order then

## 2020-04-15 NOTE — Telephone Encounter (Signed)
Pt isn't there she works there (error), her CPE isn't until March but wants labs done prior to appt at [facility]

## 2020-04-15 NOTE — Telephone Encounter (Signed)
Pt called in wanted to get her lab order sent to [facility] due to she there

## 2020-04-16 MED ORDER — ALPRAZOLAM 0.5 MG PO TABS: 0.5000 mg | ORAL_TABLET | Freq: Every day | ORAL | 0 refills | Status: DC | PRN

## 2020-04-16 NOTE — Telephone Encounter (Signed)
Pt will call back to request lab orders before CPE.  **Pt did say she asked for a refill of her xanax.**  Name of Medication: Xanax [facility] of [facility] Date and Quantity: 03/27/19 #30 tabs with 0 refills Last Office Visit and Type: 01/20/20 BMI appeal Next Office Visit and Type: 06/22/20 CPE

## 2020-06-01 ENCOUNTER — Telehealth: Payer: Self-pay | Admitting: Family Medicine

## 2020-06-01 NOTE — Telephone Encounter (Signed)
Patient is requesting to have her thyroid checked along with her labwork for her physical. Please advise. EM

## 2020-06-01 NOTE — Telephone Encounter (Signed)
See prev message, CPE lab appt is on 06/15/20

## 2020-06-01 NOTE — Telephone Encounter (Signed)
I generally order TSH with physical labs

## 2020-06-14 ENCOUNTER — Telehealth: Payer: Self-pay | Admitting: Family Medicine

## 2020-06-14 DIAGNOSIS — Z0000 Encounter for general adult medical examination without abnormal findings: Secondary | ICD-10-CM

## 2020-06-14 DIAGNOSIS — R7303 Prediabetes: Secondary | ICD-10-CM

## 2020-06-14 NOTE — Telephone Encounter (Signed)
-----   Message from Ellamae Sia sent at 06/01/2020  2:35 PM EST ----- Regarding: Lab orders for Tuesday, 3.15.22 Patient is scheduled for CPX labs, please order future labs, Thanks , Nori Riis

## 2020-06-15 ENCOUNTER — Other Ambulatory Visit (INDEPENDENT_AMBULATORY_CARE_PROVIDER_SITE_OTHER): Payer: Managed Care, Other (non HMO)

## 2020-06-15 ENCOUNTER — Other Ambulatory Visit: Payer: Self-pay

## 2020-06-15 DIAGNOSIS — Z0000 Encounter for general adult medical examination without abnormal findings: Secondary | ICD-10-CM

## 2020-06-15 DIAGNOSIS — R7303 Prediabetes: Secondary | ICD-10-CM

## 2020-06-15 NOTE — Addendum Note (Signed)
Addended by: Ellamae Sia on: 06/15/2020 07:44 AM   Modules accepted: Orders

## 2020-06-16 LAB — CBC WITH DIFFERENTIAL/PLATELET
Basophils Absolute: 0 10*3/uL (ref 0.0–0.2)
Basos: 1 %
EOS (ABSOLUTE): 0.1 10*3/uL (ref 0.0–0.4)
Eos: 1 %
Hematocrit: 43.2 % (ref 34.0–46.6)
Hemoglobin: 14.2 g/dL (ref 11.1–15.9)
Immature Grans (Abs): 0 10*3/uL (ref 0.0–0.1)
Immature Granulocytes: 0 %
Lymphocytes Absolute: 3.2 10*3/uL — ABNORMAL HIGH (ref 0.7–3.1)
Lymphs: 52 %
MCH: 28.9 pg (ref 26.6–33.0)
MCHC: 32.9 g/dL (ref 31.5–35.7)
MCV: 88 fL (ref 79–97)
Monocytes Absolute: 0.3 10*3/uL (ref 0.1–0.9)
Monocytes: 5 %
Neutrophils Absolute: 2.5 10*3/uL (ref 1.4–7.0)
Neutrophils: 41 %
Platelets: 282 10*3/uL (ref 150–450)
RBC: 4.91 x10E6/uL (ref 3.77–5.28)
RDW: 12.9 % (ref 11.7–15.4)
WBC: 6.1 10*3/uL (ref 3.4–10.8)

## 2020-06-16 LAB — COMPREHENSIVE METABOLIC PANEL
ALT: 8 IU/L (ref 0–32)
AST: 14 IU/L (ref 0–40)
Albumin/Globulin Ratio: 2.2 (ref 1.2–2.2)
Albumin: 4.2 g/dL (ref 3.8–4.9)
Alkaline Phosphatase: 88 IU/L (ref 44–121)
BUN/Creatinine Ratio: 20 (ref 9–23)
BUN: 16 mg/dL (ref 6–24)
Bilirubin Total: 0.5 mg/dL (ref 0.0–1.2)
CO2: 24 mmol/L (ref 20–29)
Calcium: 9.6 mg/dL (ref 8.7–10.2)
Chloride: 103 mmol/L (ref 96–106)
Creatinine, Ser: 0.8 mg/dL (ref 0.57–1.00)
Globulin, Total: 1.9 g/dL (ref 1.5–4.5)
Glucose: 98 mg/dL (ref 65–99)
Potassium: 5.2 mmol/L (ref 3.5–5.2)
Sodium: 140 mmol/L (ref 134–144)
Total Protein: 6.1 g/dL (ref 6.0–8.5)
eGFR: 86 mL/min/{1.73_m2} (ref >59)

## 2020-06-16 LAB — TSH: TSH: 2.53 u[IU]/mL (ref 0.450–4.500)

## 2020-06-16 LAB — LIPID PANEL
Chol/HDL Ratio: 2.9 ratio (ref 0.0–4.4)
Cholesterol, Total: 218 mg/dL — ABNORMAL HIGH (ref 100–199)
HDL: 75 mg/dL (ref >39)
LDL Chol Calc (NIH): 127 mg/dL — ABNORMAL HIGH (ref 0–99)
Triglycerides: 94 mg/dL (ref 0–149)
VLDL Cholesterol Cal: 16 mg/dL (ref 5–40)

## 2020-06-16 LAB — HEMOGLOBIN A1C
Est. average glucose Bld gHb Est-mCnc: 128 mg/dL
Hgb A1c MFr Bld: 6.1 % — ABNORMAL HIGH (ref 4.8–5.6)

## 2020-06-19 IMAGING — DX DG CHEST 2V
1 series · 1 of 1 positions shown · non-contrast
Comparison: 05/17/2015

CLINICAL DATA: Shortness of breath on exertion

EXAM:
CHEST - 2 VIEW

[chest pa]
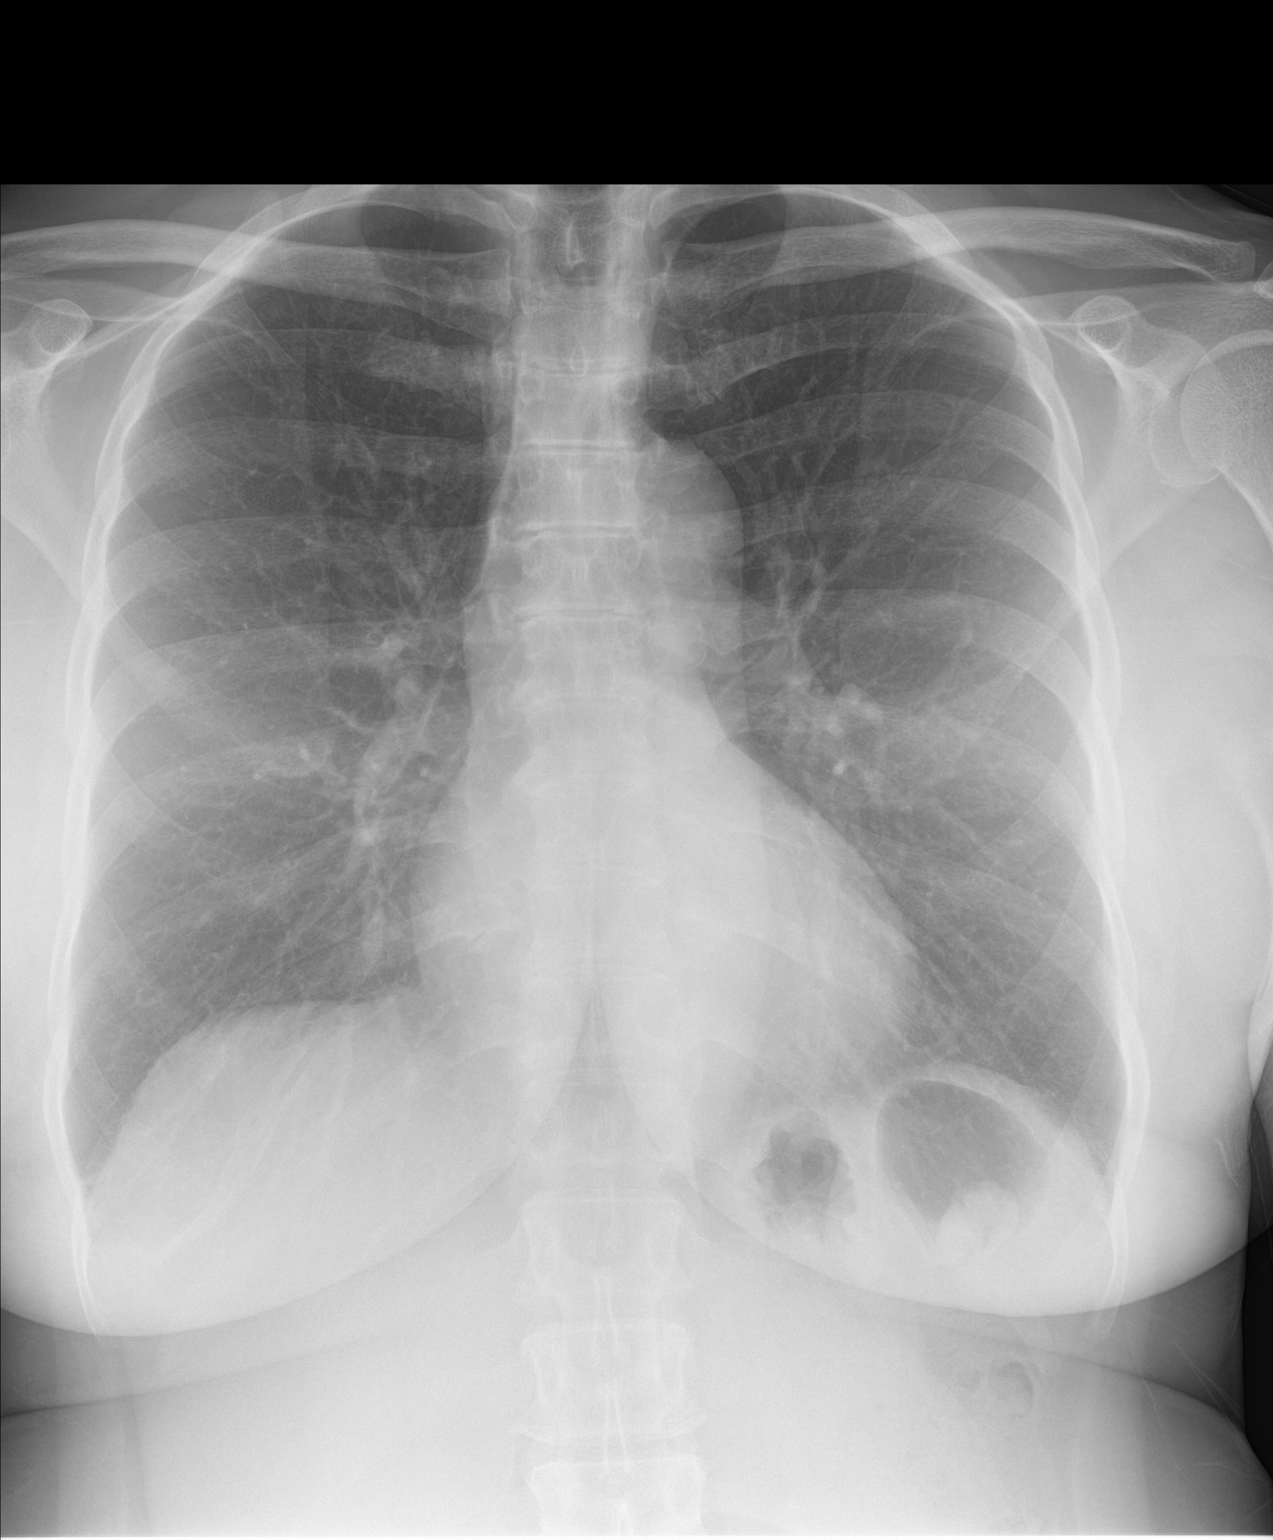

[1 of 1 positions shown; findings below may reference images not displayed]

FINDINGS: Cardiac shadows within normal limits. The lungs are well aerated
bilaterally. No focal infiltrate or sizable effusion is seen. No
bony abnormality is noted.
IMPRESSION: No acute abnormality seen

## 2020-06-22 ENCOUNTER — Encounter: Payer: Managed Care, Other (non HMO) | Admitting: Family Medicine

## 2020-06-23 ENCOUNTER — Other Ambulatory Visit: Payer: Self-pay

## 2020-06-23 ENCOUNTER — Encounter: Payer: Self-pay | Admitting: Nurse Practitioner

## 2020-06-23 ENCOUNTER — Ambulatory Visit: Payer: Managed Care, Other (non HMO) | Admitting: Nurse Practitioner

## 2020-06-23 VITALS — BP 140/80 | HR 72 | Ht 65.5 in | Wt 191.0 lb

## 2020-06-23 DIAGNOSIS — Z01419 Encounter for gynecological examination (general) (routine) without abnormal findings: Secondary | ICD-10-CM | POA: Diagnosis not present

## 2020-06-23 DIAGNOSIS — Z90710 Acquired absence of both cervix and uterus: Secondary | ICD-10-CM

## 2020-06-23 DIAGNOSIS — N952 Postmenopausal atrophic vaginitis: Secondary | ICD-10-CM | POA: Diagnosis not present

## 2020-06-23 DIAGNOSIS — R309 Painful micturition, unspecified: Secondary | ICD-10-CM | POA: Diagnosis not present

## 2020-06-23 LAB — URINALYSIS, COMPLETE W/RFL CULTURE
Bacteria, UA: NONE SEEN /HPF
Bilirubin Urine: NEGATIVE
Glucose, UA: NEGATIVE
Hgb urine dipstick: NEGATIVE
Hyaline Cast: NONE SEEN /LPF
Ketones, ur: NEGATIVE
Leukocyte Esterase: NEGATIVE
Nitrites, Initial: NEGATIVE
RBC / HPF: NONE SEEN /HPF (ref 0–2)
Specific Gravity, Urine: 1.01 (ref 1.001–1.03)
WBC, UA: NONE SEEN /HPF (ref 0–5)
pH: 6 (ref 5.0–8.0)

## 2020-06-23 LAB — NO CULTURE INDICATED

## 2020-06-23 LAB — WET PREP FOR TRICH, YEAST, CLUE

## 2020-06-23 NOTE — Progress Notes (Signed)
   Peggy Hicks [birth year] [id number]   History:  55 y.o. G2P2002 presents for annual exam with complaints of vaginal burning with urination. She was prescribed vaginal estrogen last year for similar symptoms but never started it. 2020 TAH for fibroids with bilateral salpingectomies, on no HRT. Normal pap and mammogram history.   Gynecologic History Patient's last menstrual period was 03/03/2018 (approximate).   Contraception/Family planning: status post hysterectomy  Health Maintenance Last Pap: 06/05/2019. Results were: normal Last mammogram: 11/07/2019. Results were: normal  Last colonoscopy: 06/2019, 5-year recall recommended Last Dexa: Never  Past medical history, past surgical history, family history and social history were all reviewed and documented in the EPIC chart.  ROS:  A ROS was performed and pertinent positives and negatives are included.  Exam:  Vitals:   06/23/20 1137  BP: 140/80  Pulse: 72  Weight: 191 lb (86.6 kg)  Height: 5' 5.5" (1.664 m)   Body mass index is 31.3 kg/m.  General appearance:  Normal Thyroid:  Symmetrical, normal in size, without palpable masses or nodularity. Respiratory  Auscultation:  Clear without wheezing or rhonchi Cardiovascular  Auscultation:  Regular rate, without rubs, murmurs or gallops  Edema/varicosities:  Not grossly evident Abdominal  Soft,nontender, without masses, guarding or rebound.  Liver/spleen:  No organomegaly noted  Hernia:  None appreciated  Skin  Inspection:  Grossly normal   Breasts: Examined lying and sitting.   Right: Without masses, retractions, discharge or axillary adenopathy.   Left: Without masses, retractions, discharge or axillary adenopathy. Gentitourinary   Inguinal/mons:  Normal without inguinal adenopathy  External genitalia:  Normal  BUS/Urethra/Skene's glands:  Normal  Vagina:  Normal  Cervix:  Absent  Uterus:  Absent  Adnexa/parametria:     Rt: Without masses or  tenderness.   Lt: Without masses or tenderness.  Anus and perineum: Normal  Digital rectal exam: Normal sphincter tone without palpated masses or tenderness  UA negative Wet prep negative  Assessment/Plan:  55 y.o. [id number] for annual exam.   Well female exam with routine gynecological exam - Education provided on SBEs, importance of preventative screenings, current guidelines, high calcium diet, regular exercise, and multivitamin daily. Labs with PCP.   Pain with urination - Plan: Urinalysis,Complete w/RFL Culture, WET PREP FOR TRICH, YEAST, CLUE. Negative wet prep, UA, and exam.   History of total abdominal hysterectomy - 2020 for fibroids, bilateral salpingectomies.  Atrophic vaginitis - we discussed condition and management options. She would like to try the vaginal estrogen. Recommend using twice weekly and she is aware it may take 6-8 weeks for improvement.   Screening for cervical cancer - Normal Pap history.  No longer screening per guidelines.   Screening for breast cancer - Normal mammogram history.  Continue annual screenings.  Normal breast exam today.  Screening for colon cancer - 2021 colonoscopy. Will repeat at GI's recommended interval.   Return in 1 year for annual.      Tamela Gammon DNP, 12:02 PM 06/23/2020

## 2020-06-23 NOTE — Patient Instructions (Signed)

## 2020-07-12 ENCOUNTER — Encounter: Payer: Self-pay | Admitting: Family Medicine

## 2020-07-12 ENCOUNTER — Other Ambulatory Visit: Payer: Self-pay

## 2020-07-12 ENCOUNTER — Ambulatory Visit (INDEPENDENT_AMBULATORY_CARE_PROVIDER_SITE_OTHER): Payer: Managed Care, Other (non HMO) | Admitting: Family Medicine

## 2020-07-12 VITALS — BP 134/82 | HR 71 | Temp 96.9°F | Ht 65.5 in | Wt 191.1 lb

## 2020-07-12 DIAGNOSIS — Z0000 Encounter for general adult medical examination without abnormal findings: Secondary | ICD-10-CM

## 2020-07-12 DIAGNOSIS — E785 Hyperlipidemia, unspecified: Secondary | ICD-10-CM | POA: Insufficient documentation

## 2020-07-12 DIAGNOSIS — E669 Obesity, unspecified: Secondary | ICD-10-CM

## 2020-07-12 DIAGNOSIS — R7303 Prediabetes: Secondary | ICD-10-CM | POA: Diagnosis not present

## 2020-07-12 DIAGNOSIS — E7800 Pure hypercholesterolemia, unspecified: Secondary | ICD-10-CM | POA: Diagnosis not present

## 2020-07-12 NOTE — Assessment & Plan Note (Signed)
Disc goals for lipids and reasons to control them Rev last labs with pt Rev low sat fat diet in detail LDL is up to 127 HDL 75  Disc cutting back fried food/fast food

## 2020-07-12 NOTE — Assessment & Plan Note (Signed)
Reviewed health habits including diet and exercise and skin cancer prevention Reviewed appropriate screening tests for age  Also reviewed health mt list, fam hx and immunization status , as well as social and family history   See HPI Labs reviewed  Enc her to increase exercise  covid immunized  Discussed shingrix vaccine  Mammogram and colonoscopy utd

## 2020-07-12 NOTE — Progress Notes (Signed)
Subjective:    Patient ID: Peggy Hicks, female    DOB: 1965/05/22, 55 y.o.   MRN: [id number]  This visit occurred during the SARS-CoV-2 public health emergency.  Safety protocols were in place, including screening questions prior to the visit, additional usage of staff PPE, and extensive cleaning of exam room while observing appropriate contact time as indicated for disinfecting solutions.    HPI Here for health maintenance exam and to review chronic medical problems    Wt Readings from Last 3 Encounters:  07/12/20 191 lb 1 oz (86.7 kg)  06/23/20 191 lb (86.6 kg)  03/08/20 191 lb (86.6 kg)   31.31 kg/m   Having hot flashes Doing ok overall  Walks 30 minutes every other day  Trying to reduce carbs to loose weight    covid -immunized Flu shot 9/21 Tdap 1/18 Zoster status -interested in shingrix   mammogram 8/21 Self breast exam -no lumps Had a hysterectomy   Colonoscopy 3/21 with 7 y recall   BP Readings from Last 3 Encounters:  07/12/20 134/82  06/23/20 140/80  03/08/20 130/86   Pulse Readings from Last 3 Encounters:  07/12/20 71  06/23/20 72  03/08/20 60   Prediabetes Lab Results  Component Value Date   HGBA1C 6.1 (H) 06/15/2020  this is up from 5.7 Cutting back on carbs  Avoids sugar drinks  Drinking lots of water   Cholesterol Lab Results  Component Value Date   CHOL 218 (H) 06/15/2020   CHOL 204 (H) 06/16/2019   CHOL 208 (H) 05/03/2018   Lab Results  Component Value Date   HDL 75 06/15/2020   HDL 81 06/16/2019   HDL 84 05/03/2018   Lab Results  Component Value Date   LDLCALC 127 (H) 06/15/2020   LDLCALC 111 (H) 06/16/2019   LDLCALC 106 (H) 05/03/2018   Lab Results  Component Value Date   TRIG 94 06/15/2020   TRIG 67 06/16/2019   TRIG 89 05/03/2018   Lab Results  Component Value Date   CHOLHDL 2.9 06/15/2020   CHOLHDL 2.5 06/16/2019   CHOLHDL 2.5 05/03/2018   No results found for: LDLDIRECT Eats some bacon  Fried foods-not  often /occ fried fish  occ chik filet   Mother has high cholesterol   Lab Results  Component Value Date   WBC 6.1 06/15/2020   HGB 14.2 06/15/2020   HCT 43.2 06/15/2020   MCV 88 06/15/2020   PLT 282 06/15/2020   Lab Results  Component Value Date   CREATININE 0.80 06/15/2020   BUN 16 06/15/2020   NA 140 06/15/2020   K 5.2 06/15/2020   CL 103 06/15/2020   CO2 24 06/15/2020   Lab Results  Component Value Date   ALT 8 06/15/2020   AST 14 06/15/2020   ALKPHOS 88 06/15/2020   BILITOT 0.5 06/15/2020   Lab Results  Component Value Date   TSH 2.530 06/15/2020    Patient Active Problem List   Diagnosis Date Noted  . Hyperlipidemia 07/12/2020  . Obesity (BMI 30-39.9) 01/20/2020  . Dyspnea on exertion 01/01/2019  . Globus sensation 01/01/2019  . Daily headache 06/29/2017  . Hx of adenomatous colonic polyps 04/14/2014  . Routine general medical examination at a health care facility 01/01/2012  . Stress reaction 11/13/2011  . Prediabetes 11/15/2010  . PRIMARY FOCAL HYPERHIDROSIS 10/22/2007  . Generalized anxiety disorder 08/01/2006   Past Medical History:  Diagnosis Date  . Allergy   . Arthritis    knee  .  Fibroids   . GERD (gastroesophageal reflux disease)   . Heart valve regurgitation 2020  . Hx of adenomatous colonic polyps 04/14/2014  . Hyperhidrosis   . Stress reaction    Past Surgical History:  Procedure Laterality Date  . BREAST BIOPSY    . BREAST SURGERY     Breast Reduction  . CESAREAN SECTION      X 2  . COLONOSCOPY    . HYSTERECTOMY ABDOMINAL WITH SALPINGECTOMY Bilateral 06/04/2018   Procedure: HYSTERECTOMY ABDOMINAL WITH SALPINGECTOMY;  Surgeon: Anastasio Auerbach, MD;  Location: [facility] [facility];  Service: Gynecology;  Laterality: Bilateral;  . HYSTEROSCOPY    . MYOMECTOMY     Uterine fibroids removed/ hysteroscopic submucous myomectomy  . POLYPECTOMY    . TUBAL LIGATION     Social History   Tobacco Use  . Smoking status: Never  Smoker  . Smokeless tobacco: Never Used  Vaping Use  . Vaping Use: Never used  Substance Use Topics  . Alcohol use: Yes    Alcohol/week: 0.0 standard drinks    Comment: Rare  . Drug use: No   Family History  Problem Relation Age of Onset  . Hypertension Mother   . Heart disease Mother   . Diabetes Mother   . Colon polyps Mother   . Hypertension Father   . Heart disease Father   . Cancer Maternal Aunt        liver  . Colon cancer Neg Hx   . Esophageal cancer Neg Hx   . Rectal cancer Neg Hx   . Stomach cancer Neg Hx    No Known Allergies Current Outpatient Medications on File Prior to Visit  Medication Sig Dispense Refill  . ALPRAZolam (XANAX) 0.5 MG tablet Take 1 tablet (0.5 mg total) by mouth daily as needed for anxiety. For traveling 30 tablet 0  . cetirizine (ZYRTEC) 10 MG tablet Take 10 mg by mouth daily as needed for allergies.    Marland Kitchen ELDERBERRY PO Take by mouth. Takes occassionally    . estradiol (ESTRACE VAGINAL) 0.1 MG/GM vaginal cream Place 1 Applicatorful vaginally at bedtime. Apply nightly x2 weeks, then every other night x2 weeks, then 2 nights per week for at least 3 months 42.5 g 12  . fluticasone (FLONASE) 50 MCG/ACT nasal spray Place 1 spray into both nostrils daily as needed for allergies or rhinitis.    . Lactobacillus (DIGESTIVE HEALTH PROBIOTIC) CAPS Take 1 capsule by mouth daily.    . naproxen sodium (ALEVE) 220 MG tablet Take 440 mg by mouth daily as needed (pain).    . NON FORMULARY Menopause vitamins daily    . Vitamin D, Cholecalciferol, 25 MCG (1000 UT) CAPS Take by mouth. Takes occasionally     No current facility-administered medications on file prior to visit.    Review of Systems  Constitutional: Positive for fatigue. Negative for activity change, appetite change, fever and unexpected weight change.  HENT: Negative for congestion, ear pain, rhinorrhea, sinus pressure and sore throat.   Eyes: Negative for pain, redness and visual disturbance.   Respiratory: Negative for cough, shortness of breath and wheezing.   Cardiovascular: Negative for chest pain and palpitations.  Gastrointestinal: Negative for abdominal pain, blood in stool, constipation and diarrhea.  Endocrine: Negative for polydipsia and polyuria.  Genitourinary: Negative for dysuria, frequency and urgency.  Musculoskeletal: Positive for arthralgias. Negative for back pain and myalgias.  Skin: Negative for pallor and rash.  Allergic/Immunologic: Negative for environmental allergies.  Neurological: Negative for  dizziness, syncope and headaches.  Hematological: Negative for adenopathy. Does not bruise/bleed easily.  Psychiatric/Behavioral: Negative for decreased concentration and dysphoric mood. The patient is not nervous/anxious.        Objective:   Physical Exam Constitutional:      General: She is not in acute distress.    Appearance: Normal appearance. She is well-developed. She is obese. She is not ill-appearing or diaphoretic.  HENT:     Head: Normocephalic and atraumatic.     Right Ear: Tympanic membrane, ear canal and external ear normal.     Left Ear: Tympanic membrane, ear canal and external ear normal.     Nose: Nose normal. No congestion.     Mouth/Throat:     Mouth: Mucous membranes are moist.     Pharynx: Oropharynx is clear. No posterior oropharyngeal erythema.  Eyes:     General: No scleral icterus.    Extraocular Movements: Extraocular movements intact.     Conjunctiva/sclera: Conjunctivae normal.     Pupils: Pupils are equal, round, and reactive to light.  Neck:     Thyroid: No thyromegaly.     Vascular: No carotid bruit or JVD.  Cardiovascular:     Rate and Rhythm: Normal rate and regular rhythm.     Pulses: Normal pulses.     Heart sounds: Normal heart sounds. No gallop.   Pulmonary:     Effort: Pulmonary effort is normal. No respiratory distress.     Breath sounds: Normal breath sounds. No wheezing.     Comments: Good air exch Chest:      Chest wall: No tenderness.  Abdominal:     General: Bowel sounds are normal. There is no distension or abdominal bruit.     Palpations: Abdomen is soft. There is no mass.     Tenderness: There is no abdominal tenderness.     Hernia: No hernia is present.  Genitourinary:    Comments: Breast exam: No mass, nodules, thickening, tenderness, bulging, retraction, inflamation, nipple discharge or skin changes noted.  No axillary or clavicular LA.     Musculoskeletal:        General: No tenderness. Normal range of motion.     Cervical back: Normal range of motion and neck supple. No rigidity. No muscular tenderness.     Right lower leg: No edema.     Left lower leg: No edema.     Comments: No kyphosis   Lymphadenopathy:     Cervical: No cervical adenopathy.  Skin:    General: Skin is warm and dry.     Coloration: Skin is not pale.     Findings: No erythema or rash.     Comments: Skin tags on trunk  Neurological:     Mental Status: She is alert. Mental status is at baseline.     Cranial Nerves: No cranial nerve deficit.     Motor: No abnormal muscle tone.     Coordination: Coordination normal.     Gait: Gait normal.     Deep Tendon Reflexes: Reflexes are normal and symmetric. Reflexes normal.  Psychiatric:        Mood and Affect: Mood normal.        Cognition and Memory: Cognition and memory normal.           Assessment & Plan:   Problem List Items Addressed This Visit      Other   Prediabetes    Lab Results  Component Value Date   HGBA1C 6.1 (H) 06/15/2020  disc imp of low glycemic diet and wt loss to prevent DM2       Routine general medical examination at a health care facility - Primary    Reviewed health habits including diet and exercise and skin cancer prevention Reviewed appropriate screening tests for age  Also reviewed health mt list, fam hx and immunization status , as well as social and family history   See HPI Labs reviewed  Enc her to increase  exercise  covid immunized  Discussed shingrix vaccine  Mammogram and colonoscopy utd       Obesity (BMI 30-39.9)    Discussed how this problem influences overall health and the risks it imposes  Reviewed plan for weight loss with lower calorie diet (via better food choices and also portion control or program like weight watchers) and exercise building up to or more than 30 minutes 5 days per week including some aerobic activity   Offered ref to healthy weight and wellness clinic Also discussed trial of GLP-1 drug if covered by insurance (pre diabetic) Pt will update with choice      Hyperlipidemia    Disc goals for lipids and reasons to control them Rev last labs with pt Rev low sat fat diet in detail LDL is up to 127 HDL 75  Disc cutting back fried food/fast food

## 2020-07-12 NOTE — Assessment & Plan Note (Signed)
Lab Results  Component Value Date   HGBA1C 6.1 (H) 06/15/2020   disc imp of low glycemic diet and wt loss to prevent DM2

## 2020-07-12 NOTE — Assessment & Plan Note (Signed)
Discussed how this problem influences overall health and the risks it imposes  Reviewed plan for weight loss with lower calorie diet (via better food choices and also portion control or program like weight watchers) and exercise building up to or more than 30 minutes 5 days per week including some aerobic activity   Offered ref to healthy weight and wellness clinic Also discussed trial of GLP-1 drug if covered by insurance (pre diabetic) Pt will update with choice

## 2020-07-12 NOTE — Patient Instructions (Addendum)
The diabetes drug Semaglutide is used for weight loss   If interested - check with insurance about coverage   If you want a referral to the healthy weight and wellness clinic let us know as well   If you are interested in the shingles vaccine series (Shingrix), call your insurance or pharmacy to check on coverage and location it must be given.  If affordable - you can schedule it here or at your pharmacy depending on coverage   Cholesterol Avoid red meat/ fried foods/ egg yolks/ fatty breakfast meats/ butter, cheese and high fat dairy/ and shellfish    Keep walking and increase it as you can

## 2020-10-03 ENCOUNTER — Other Ambulatory Visit: Payer: Self-pay | Admitting: Family Medicine

## 2020-10-05 MED ORDER — ALPRAZOLAM 0.5 MG PO TABS: 0.5000 mg | ORAL_TABLET | Freq: Every day | ORAL | 0 refills | Status: DC | PRN

## 2020-10-05 NOTE — Telephone Encounter (Signed)
Name of Medication: Xanax Name of [facility] Date and Quantity: 04/16/20 #30 tabs with 0 refills Last Office Visit and Type: 07/12/20 CPE Next Office Visit and Type: none scheduled

## 2020-10-20 ENCOUNTER — Other Ambulatory Visit: Payer: Self-pay | Admitting: Family Medicine

## 2020-10-20 NOTE — Telephone Encounter (Signed)
Med was d/c on med list but now pt would like to restart so will route to PCP for review

## 2020-10-20 NOTE — Telephone Encounter (Signed)
Pt left v/m requesting status of refill for famotidine from pharmacy. Having heartburn issues again. Pt advised by pharmacy that our office has not responded to refill request for famotidine and pt is going out of town tomorrow and request cb today that refill has been done. Sending note to [place].

## 2020-11-12 ENCOUNTER — Encounter: Payer: Self-pay | Admitting: Family Medicine

## 2020-11-15 ENCOUNTER — Telehealth (INDEPENDENT_AMBULATORY_CARE_PROVIDER_SITE_OTHER): Payer: Managed Care, Other (non HMO) | Admitting: Family Medicine

## 2020-11-15 ENCOUNTER — Encounter: Payer: Self-pay | Admitting: Family Medicine

## 2020-11-15 ENCOUNTER — Other Ambulatory Visit: Payer: Self-pay

## 2020-11-15 DIAGNOSIS — J0110 Acute frontal sinusitis, unspecified: Secondary | ICD-10-CM

## 2020-11-15 MED ORDER — AMOXICILLIN-POT CLAVULANATE 875-125 MG PO TABS: 1.0000 | ORAL_TABLET | Freq: Two times a day (BID) | ORAL | 0 refills | Status: DC

## 2020-11-15 NOTE — Progress Notes (Signed)
Virtual Visit via Video Note  I connected with Peggy Hicks on 11/15/20 at  3:30 PM EDT by a video enabled telemedicine application and verified that I am speaking with the correct person using two identifiers.  Location: Patient: home  Provider: office    Parties involved in encounter  Patient: Peggy Hicks   Provider:  Loura Pardon MD   I discussed the limitations of evaluation and management by telemedicine and the availability of in person appointments. The patient expressed understanding and agreed to proceed.  History of Present Illness: Pt presents with ear pain s/p covid   Had covid 2 weeks ago right after vacation   Originally felt like the flu  R ear started to ache  Now a dull pain , no popping , can hear ok  A little congestion  Just a little cough- some phlegm , it is yellow  Nasal d/c- yellow to clear  Some sinus pain above her eyes  Bad headaches - lot of pressure with headache   No fever Not dizzy  Not nauseated   Flonase-not using   Mucinex DM is helpful   Review of Systems  Constitutional:  Negative for chills, fever and malaise/fatigue.  HENT:  Positive for congestion and sinus pain. Negative for ear pain and sore throat.   Eyes:  Negative for blurred vision, discharge and redness.  Respiratory:  Positive for cough and sputum production. Negative for shortness of breath, wheezing and stridor.   Cardiovascular:  Negative for chest pain, palpitations and leg swelling.  Gastrointestinal:  Negative for abdominal pain, diarrhea, nausea and vomiting.  Musculoskeletal:  Negative for myalgias.  Skin:  Negative for rash.  Neurological:  Negative for dizziness and headaches.     Observations/Objective: Patient appears well, in no distress Weight is baseline  No facial swelling or asymmetry Notes pain in frontal sinuses with some tenderness  Normal voice-not hoarse and no slurred speech (does sound congested) No obvious tremor or mobility  impairment Moving neck and UEs normally Able to hear the call well  No cough or shortness of breath during interview  Talkative and mentally sharp with no cognitive changes No skin changes on face or neck , no rash or pallor Affect is normal    Assessment and Plan: Problem List Items Addressed This Visit       Respiratory   Acute sinusitis    2 weeks s/p covid  Some congestion and frontal sinus pain and tenderness bilat R ear pain  No fever Some yellow mucous (from nose and cough) Disc symptomatic care, will continue mucinex inst to start flonase daily -at least 2 wk augmentin sent to pharmacy  Update if not starting to improve in a week or if worsening         Relevant Medications   amoxicillin-clavulanate (AUGMENTIN) 875-125 MG tablet     Follow Up Instructions: Drink fluids and rest  mucinex DM is good for cough and congestion  Nasal saline for congestion as needed  Get started on flonase daily for at least 2 weeks  Tylenol for fever or pain or headache  I sent augmentin to your pharmacy for a sinus infection (would also cover an ear infection) Please alert [place] if symptoms worsen (if severe or short of breath please go to the ER)    I discussed the assessment and treatment plan with the patient. The patient was provided an opportunity to ask questions and all were answered. The patient agreed with the plan and demonstrated  an understanding of the instructions.   The patient was advised to call back or seek an in-person evaluation if the symptoms worsen or if the condition fails to improve as anticipated.     Loura Pardon, MD

## 2020-11-15 NOTE — Assessment & Plan Note (Signed)
2 weeks s/p covid  Some congestion and frontal sinus pain and tenderness bilat R ear pain  No fever Some yellow mucous (from nose and cough) Disc symptomatic care, will continue mucinex inst to start flonase daily -at least 2 wk augmentin sent to pharmacy  Update if not starting to improve in a week or if worsening

## 2020-11-15 NOTE — Patient Instructions (Signed)
Drink fluids and rest  mucinex DM is good for cough and congestion  Nasal saline for congestion as needed  Get started on flonase daily for at least 2 weeks  Tylenol for fever or pain or headache  I sent augmentin to your pharmacy for a sinus infection (would also cover an ear infection) Please alert [place] if symptoms worsen (if severe or short of breath please go to the ER)

## 2021-01-03 ENCOUNTER — Ambulatory Visit: Payer: Managed Care, Other (non HMO) | Admitting: Family Medicine

## 2021-01-03 ENCOUNTER — Encounter: Payer: Self-pay | Admitting: Family Medicine

## 2021-01-03 ENCOUNTER — Other Ambulatory Visit: Payer: Self-pay

## 2021-01-03 VITALS — BP 142/86 | HR 62 | Temp 97.3°F | Ht 65.5 in | Wt 189.0 lb

## 2021-01-03 DIAGNOSIS — M5416 Radiculopathy, lumbar region: Secondary | ICD-10-CM

## 2021-01-03 DIAGNOSIS — M5441 Lumbago with sciatica, right side: Secondary | ICD-10-CM | POA: Diagnosis not present

## 2021-01-03 NOTE — Patient Instructions (Addendum)
Continue aleve with food as needed    Do stretches -from PT (also see the handout)   Use heat and /or ice for 10 minutes at a time to help spasm also  Keep walking   If lying on your back try a pillow under bent knees On side- pillow between bent knees  Update if not starting to improve in a week or if worsening

## 2021-01-03 NOTE — Assessment & Plan Note (Signed)
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

## 2021-01-03 NOTE — Progress Notes (Signed)
Subjective:    Patient ID: Peggy Hicks, female    DOB: 06-08-1965, 55 y.o.   MRN: [id number]  This visit occurred during the SARS-CoV-2 public health emergency.  Safety protocols were in place, including screening questions prior to the visit, additional usage of staff PPE, and extensive cleaning of exam room while observing appropriate contact time as indicated for disinfecting solutions.   HPI Pt presents for c/o back pain   Wt Readings from Last 3 Encounters:  01/03/21 189 lb (85.7 kg)  07/12/20 191 lb 1 oz (86.7 kg)  06/23/20 191 lb (86.6 kg)   30.97 kg/m  Back pain came back last week  Felt like a crick in back and really hurt when she moves the wrong way  Not as bad today   Worse on the Right -low back  Radiates into her buttock   Achey in nature , dull   Has a new mattress that she likes/firm   Improved by use of aleve  Has not tried heat or ice  Has been stretching and has old PT exercises  Better on side than back  Better once she gets walking Stiff after inactivity   Worse to sit, trying to get up more  Is trying to loose weight   Has had low back pain before in the past  Went to the [facility] [facility] in 2015 L5-S1 deg change   Dislikes muscle relaxers  Patient Active Problem List   Diagnosis Date Noted   Hyperlipidemia 07/12/2020   Obesity (BMI 30-39.9) 01/20/2020   Dyspnea on exertion 01/01/2019   Globus sensation 01/01/2019   Daily headache 06/29/2017   Hx of adenomatous colonic polyps 04/14/2014   Routine general medical examination at a health care facility 01/01/2012   Stress reaction 11/13/2011   Prediabetes 11/15/2010   PRIMARY FOCAL HYPERHIDROSIS 10/22/2007   Low back pain 05/14/2007   Generalized anxiety disorder 08/01/2006   Past Medical History:  Diagnosis Date   Allergy    Arthritis    knee   Fibroids    GERD (gastroesophageal reflux disease)    Heart valve regurgitation 2020   Hx of adenomatous colonic polyps 04/14/2014    Hyperhidrosis    Stress reaction    Past Surgical History:  Procedure Laterality Date   BREAST BIOPSY     BREAST SURGERY     Breast Reduction   CESAREAN SECTION      X 2   COLONOSCOPY     HYSTERECTOMY ABDOMINAL WITH SALPINGECTOMY Bilateral 06/04/2018   Procedure: HYSTERECTOMY ABDOMINAL WITH SALPINGECTOMY;  Surgeon: Anastasio Auerbach, MD;  Location: [facility];  Service: Gynecology;  Laterality: Bilateral;   HYSTEROSCOPY     MYOMECTOMY     Uterine fibroids removed/ hysteroscopic submucous myomectomy   POLYPECTOMY     TUBAL LIGATION     Social History   Tobacco Use   Smoking status: Never   Smokeless tobacco: Never  Vaping Use   Vaping Use: Never used  Substance Use Topics   Alcohol use: Yes    Alcohol/week: 0.0 standard drinks    Comment: Rare   Drug use: No   Family History  Problem Relation Age of Onset   Hypertension Mother    Heart disease Mother    Diabetes Mother    Colon polyps Mother    Hypertension Father    Heart disease Father    Cancer Maternal Aunt        liver   Colon cancer Neg Hx  Esophageal cancer Neg Hx    Rectal cancer Neg Hx    Stomach cancer Neg Hx    Allergies  Allergen Reactions   Shrimp Flavor     Feels like throat closes up when eat shrimp   Current Outpatient Medications on File Prior to Visit  Medication Sig Dispense Refill   ALPRAZolam (XANAX) 0.5 MG tablet Take 1 tablet (0.5 mg total) by mouth daily as needed for anxiety. For traveling 30 tablet 0   amoxicillin-clavulanate (AUGMENTIN) 875-125 MG tablet Take 1 tablet by mouth 2 (two) times daily. 14 tablet 0   cetirizine (ZYRTEC) 10 MG tablet Take 10 mg by mouth daily as needed for allergies.     ELDERBERRY PO Take by mouth. Takes occassionally     estradiol (ESTRACE VAGINAL) 0.1 MG/GM vaginal cream Place 1 Applicatorful vaginally at bedtime. Apply nightly x2 weeks, then every other night x2 weeks, then 2 nights per week for at least 3 months 42.5 g 12    famotidine (PEPCID) 40 MG tablet TAKE 1 TABLET BY MOUTH EVERY DAY 90 tablet 1   fluticasone (FLONASE) 50 MCG/ACT nasal spray Place 1 spray into both nostrils daily as needed for allergies or rhinitis.     Lactobacillus (DIGESTIVE HEALTH PROBIOTIC) CAPS Take 1 capsule by mouth daily.     naproxen sodium (ALEVE) 220 MG tablet Take 440 mg by mouth daily as needed (pain).     NON FORMULARY Menopause vitamins daily     Vitamin D, Cholecalciferol, 25 MCG (1000 UT) CAPS Take by mouth. Takes occasionally     No current facility-administered medications on file prior to visit.     Review of Systems  Constitutional:  Negative for activity change, appetite change, fatigue, fever and unexpected weight change.  HENT:  Negative for congestion, ear pain, rhinorrhea, sinus pressure and sore throat.   Eyes:  Negative for pain, redness and visual disturbance.  Respiratory:  Negative for cough, shortness of breath and wheezing.   Cardiovascular:  Negative for chest pain and palpitations.  Gastrointestinal:  Negative for abdominal pain, blood in stool, constipation and diarrhea.  Endocrine: Negative for polydipsia and polyuria.  Genitourinary:  Negative for dysuria, frequency and urgency.  Musculoskeletal:  Positive for back pain. Negative for arthralgias and myalgias.  Skin:  Negative for pallor and rash.  Allergic/Immunologic: Negative for environmental allergies.  Neurological:  Negative for dizziness, syncope, weakness, numbness and headaches.  Hematological:  Negative for adenopathy. Does not bruise/bleed easily.  Psychiatric/Behavioral:  Negative for decreased concentration and dysphoric mood. The patient is not nervous/anxious.       Objective:   Physical Exam Constitutional:      General: She is not in acute distress.    Appearance: Normal appearance. She is well-developed. She is obese. She is not ill-appearing.  HENT:     Head: Normocephalic and atraumatic.  Eyes:     General: No scleral  icterus.    Conjunctiva/sclera: Conjunctivae normal.     Pupils: Pupils are equal, round, and reactive to light.  Cardiovascular:     Rate and Rhythm: Normal rate and regular rhythm.  Pulmonary:     Effort: Pulmonary effort is normal.     Breath sounds: Normal breath sounds. No wheezing or rales.  Abdominal:     General: Bowel sounds are normal. There is no distension.     Palpations: Abdomen is soft.     Tenderness: There is no abdominal tenderness.  Musculoskeletal:        General:  Tenderness present.     Cervical back: Normal range of motion and neck supple.     Lumbar back: Spasms and tenderness present. No swelling, edema, deformity or bony tenderness. Decreased range of motion. Negative right straight leg raise test and negative left straight leg raise test.     Comments: Prominent lordosis (baseline)  No bony tenderness Pain with full flex and R lateral bend  Nl DTRs and neg SLR bilat  Nl gait  Lymphadenopathy:     Cervical: No cervical adenopathy.  Skin:    General: Skin is warm and dry.     Coloration: Skin is not pale.     Findings: No erythema or rash.  Neurological:     Mental Status: She is alert.     Cranial Nerves: No cranial nerve deficit.     Sensory: No sensory deficit.     Motor: No weakness, atrophy or abnormal muscle tone.     Coordination: Coordination normal.     Deep Tendon Reflexes: Reflexes are normal and symmetric. Reflexes normal.     Comments: Negative SLR  Psychiatric:        Mood and Affect: Mood normal.          Assessment & Plan:   Problem List Items Addressed This Visit       Nervous and Auditory   RESOLVED: Left lumbar radiculopathy - Primary     Other   Low back pain    History of deg disc dz at L5-S1 Saw orthopedics in 2015  Today some acute flare, with R sided pain rad to buttock No neuro changes/reassuring exam  Suspect spasm Plan to  Continue aleve prn  Employ heat and/or ice Get back to PT exercises (printed) Pt  declines muscle relaxer for now  ER parameters disc (neuro changes)  Update if not starting to improve in a week or if worsening

## 2021-02-13 ENCOUNTER — Other Ambulatory Visit: Payer: Self-pay | Admitting: Family Medicine

## 2021-02-14 MED ORDER — ALPRAZOLAM 0.5 MG PO TABS: 0.5000 mg | ORAL_TABLET | Freq: Every day | ORAL | 0 refills | Status: AC | PRN

## 2021-02-14 NOTE — Telephone Encounter (Signed)
Name of Medication: Xanax Name of Pharmacy: [facility] or Written Date and Quantity: 10/05/20 #30 tabs with 0 refills Last Office Visit and Type: 01/03/21 back pain Next Office Visit and Type: none scheduled

## 2021-02-17 ENCOUNTER — Other Ambulatory Visit: Payer: Self-pay | Admitting: Family Medicine

## 2021-02-18 ENCOUNTER — Encounter: Payer: Self-pay | Admitting: Family Medicine

## 2021-02-28 ENCOUNTER — Other Ambulatory Visit: Payer: Self-pay

## 2021-02-28 DIAGNOSIS — I340 Nonrheumatic mitral (valve) insufficiency: Secondary | ICD-10-CM

## 2021-03-08 ENCOUNTER — Other Ambulatory Visit: Payer: Self-pay

## 2021-03-08 ENCOUNTER — Encounter: Payer: Self-pay | Admitting: Family Medicine

## 2021-03-08 ENCOUNTER — Ambulatory Visit: Payer: Managed Care, Other (non HMO) | Admitting: Family Medicine

## 2021-03-08 VITALS — BP 128/86 | HR 55 | Temp 97.3°F | Ht 65.5 in | Wt 192.2 lb

## 2021-03-08 DIAGNOSIS — F411 Generalized anxiety disorder: Secondary | ICD-10-CM | POA: Diagnosis not present

## 2021-03-08 DIAGNOSIS — F430 Acute stress reaction: Secondary | ICD-10-CM | POA: Diagnosis not present

## 2021-03-08 NOTE — Assessment & Plan Note (Signed)
Worse anxiety/irritability See A/P

## 2021-03-08 NOTE — Assessment & Plan Note (Addendum)
This is worse lately in the setting of work stress, menopause issues and caregiving Reviewed stressors/ coping techniques/symptoms/ support sources/ tx options and side effects in detail today  PHQ score of 5 and GAD-7 score of 16 No SI at all and fairly good insight  Pt is open to counseling and will either approach her EAP program or call one of the listed alternatives given (unsure of insurance coverage) I recommended the [facility] and she can walk in for care also  Good support, encouraged talking to friends and writing, reading and adding back exercise  Discussed medication options and given handout with info re: buspar (had considered it before) Xanax is not an acceptable alternative and discussed why  Handouts given re: anxiety, management, mindfulness as well inst to review info and then touch base re: plan  Is actively looking for new job and may also use vacation time to do that  Reassuring exam  She plans also to f/u with cardiologist as prev planned

## 2021-03-08 NOTE — Progress Notes (Signed)
Subjective:    Patient ID: Peggy Hicks, female    DOB: 1965-06-23, 55 y.o.   MRN: [id number]  This visit occurred during the SARS-CoV-2 public health emergency.  Safety protocols were in place, including screening questions prior to the visit, additional usage of staff PPE, and extensive cleaning of exam room while observing appropriate contact time as indicated for disinfecting solutions.   HPI Pt presents with c/o anxiety from stress  Wt Readings from Last 3 Encounters:  03/08/21 192 lb 4 oz (87.2 kg)  01/03/21 189 lb (85.7 kg)  07/12/20 191 lb 1 oz (86.7 kg)   31.51 kg/m  More anxious  Works 9-10 hours per day  Her boss has her on a performance improvement plan (for petty things) and now she has to write down everything she does  Very stressful  Is micro managed  Cannot do any better than she is / feels harassed at work   Has worked on her resume  Would like to look for another job  Will always have to work at home  She has found some prospects   Trying to also take care of her mom after she had hand  surgery -will not be able to live by herself anymore   Feels like her hormones are wacky  Anxious Irritable  Funny in chest  Cannot sleep well  Head hurts on/off Globus sensation comes and goes  Thinks the emotional upheaval is affecting her health  Has a cardiology appt coming up     Coping: Not talking to anyone except a few girlfriends (good listeners)  Helping her find a job also  She writes in a journal  She listens to books/audible  Not exercising since covid hit  (would like to get back to boost camp)   Meeting set up with employee relations   Would like to loose weight  She does try to eat healthy-working on more fruits and veg  Too much snacking /emotional eating   No etoh or drugs or smoking   Would be open to counseling/has done it before   She has xanax occasionally   Lab Results  Component Value Date   TSH 2.530 06/15/2020     BP  Readings from Last 3 Encounters:  03/08/21 128/86  01/03/21 (!) 142/86  07/12/20 134/82   Pulse Readings from Last 3 Encounters:  03/08/21 (!) 55  01/03/21 62  07/12/20 71   Patient Active Problem List   Diagnosis Date Noted   Hyperlipidemia 07/12/2020   Obesity (BMI 30-39.9) 01/20/2020   Dyspnea on exertion 01/01/2019   Globus sensation 01/01/2019   Daily headache 06/29/2017   Hx of adenomatous colonic polyps 04/14/2014   Routine general medical examination at a health care facility 01/01/2012   Stress reaction 11/13/2011   Prediabetes 11/15/2010   PRIMARY FOCAL HYPERHIDROSIS 10/22/2007   Low back pain 05/14/2007   Generalized anxiety disorder 08/01/2006   Past Medical History:  Diagnosis Date   Allergy    Arthritis    knee   Fibroids    GERD (gastroesophageal reflux disease)    Heart valve regurgitation 2020   Hx of adenomatous colonic polyps 04/14/2014   Hyperhidrosis    Stress reaction    Past Surgical History:  Procedure Laterality Date   BREAST BIOPSY     BREAST SURGERY     Breast Reduction   CESAREAN SECTION      X 2   COLONOSCOPY     HYSTERECTOMY ABDOMINAL WITH SALPINGECTOMY  Bilateral 06/04/2018   Procedure: HYSTERECTOMY ABDOMINAL WITH SALPINGECTOMY;  Surgeon: Anastasio Auerbach, MD;  Location: [facility];  Service: Gynecology;  Laterality: Bilateral;   HYSTEROSCOPY     MYOMECTOMY     Uterine fibroids removed/ hysteroscopic submucous myomectomy   POLYPECTOMY     TUBAL LIGATION     Social History   Tobacco Use   Smoking status: Never   Smokeless tobacco: Never  Vaping Use   Vaping Use: Never used  Substance Use Topics   Alcohol use: Yes    Alcohol/week: 0.0 standard drinks    Comment: Rare   Drug use: No   Family History  Problem Relation Age of Onset   Hypertension Mother    Heart disease Mother    Diabetes Mother    Colon polyps Mother    Hypertension Father    Heart disease Father    Cancer Maternal Aunt        liver    Colon cancer Neg Hx    Esophageal cancer Neg Hx    Rectal cancer Neg Hx    Stomach cancer Neg Hx    Allergies  Allergen Reactions   Shrimp Flavor     Feels like throat closes up when eat shrimp   Current Outpatient Medications on File Prior to Visit  Medication Sig Dispense Refill   ALPRAZolam (XANAX) 0.5 MG tablet Take 1 tablet (0.5 mg total) by mouth daily as needed for anxiety. For traveling 30 tablet 0   cetirizine (ZYRTEC) 10 MG tablet Take 10 mg by mouth daily as needed for allergies.     ELDERBERRY PO Take by mouth. Takes occassionally     estradiol (ESTRACE VAGINAL) 0.1 MG/GM vaginal cream Place 1 Applicatorful vaginally at bedtime. Apply nightly x2 weeks, then every other night x2 weeks, then 2 nights per week for at least 3 months 42.5 g 12   famotidine (PEPCID) 40 MG tablet TAKE 1 TABLET BY MOUTH EVERY DAY 90 tablet 1   fluticasone (FLONASE) 50 MCG/ACT nasal spray Place 1 spray into both nostrils daily as needed for allergies or rhinitis.     Lactobacillus (DIGESTIVE HEALTH PROBIOTIC) CAPS Take 1 capsule by mouth daily.     naproxen sodium (ALEVE) 220 MG tablet Take 440 mg by mouth daily as needed (pain).     NON FORMULARY Menopause vitamins daily     Vitamin D, Cholecalciferol, 25 MCG (1000 UT) CAPS Take by mouth. Takes occasionally     No current facility-administered medications on file prior to visit.      Review of Systems  Constitutional:  Positive for fatigue. Negative for activity change, appetite change, fever and unexpected weight change.  HENT:  Negative for congestion, ear pain, rhinorrhea, sinus pressure and sore throat.   Eyes:  Negative for pain, redness and visual disturbance.  Respiratory:  Negative for cough, shortness of breath and wheezing.   Cardiovascular:  Negative for chest pain and palpitations.  Gastrointestinal:  Negative for abdominal pain, blood in stool, constipation and diarrhea.  Endocrine: Negative for polydipsia and polyuria.   Genitourinary:  Negative for dysuria, frequency and urgency.  Musculoskeletal:  Negative for arthralgias, back pain and myalgias.  Skin:  Negative for pallor and rash.  Allergic/Immunologic: Negative for environmental allergies.  Neurological:  Negative for dizziness, syncope and headaches.  Hematological:  Negative for adenopathy. Does not bruise/bleed easily.  Psychiatric/Behavioral:  Positive for decreased concentration, dysphoric mood and sleep disturbance. Negative for confusion, hallucinations, self-injury and suicidal ideas. The  patient is nervous/anxious.       Objective:   Physical Exam Constitutional:      General: She is not in acute distress.    Appearance: Normal appearance. She is obese. She is not ill-appearing or diaphoretic.  HENT:     Head: Normocephalic and atraumatic.     Mouth/Throat:     Mouth: Mucous membranes are moist.  Eyes:     General: No scleral icterus.       Right eye: No discharge.        Left eye: No discharge.     Conjunctiva/sclera: Conjunctivae normal.     Pupils: Pupils are equal, round, and reactive to light.  Neck:     Vascular: No carotid bruit.  Cardiovascular:     Rate and Rhythm: Regular rhythm. Bradycardia present.     Heart sounds: Normal heart sounds.  Pulmonary:     Effort: Pulmonary effort is normal. No respiratory distress.     Breath sounds: No wheezing.  Musculoskeletal:     Cervical back: Neck supple.     Right lower leg: No edema.     Left lower leg: No edema.  Lymphadenopathy:     Cervical: No cervical adenopathy.  Skin:    Coloration: Skin is not pale.     Findings: No rash.  Neurological:     Mental Status: She is alert.     Coordination: Coordination normal.     Deep Tendon Reflexes: Reflexes normal.     Comments: No tremor   Psychiatric:        Attention and Perception: Attention normal.        Mood and Affect: Mood is anxious.        Speech: Speech normal.        Behavior: Behavior normal.        Thought  Content: Thought content normal. Thought content is not paranoid. Thought content does not include suicidal ideation.        Cognition and Memory: Cognition and memory normal.     Comments: Candidly discusses stressors and symptoms           Assessment & Plan:   Problem List Items Addressed This Visit       Other   Generalized anxiety disorder - Primary    This is worse lately in the setting of work stress, menopause issues and caregiving Reviewed stressors/ coping techniques/symptoms/ support sources/ tx options and side effects in detail today  PHQ score of 5 and GAD-7 score of 16 No SI at all and fairly good insight  Pt is open to counseling and will either approach her EAP program or call one of the listed alternatives given (unsure of insurance coverage) I recommended the [facility] and she can walk in for care also  Good support, encouraged talking to friends and writing, reading and adding back exercise  Discussed medication options and given handout with info re: buspar (had considered it before) Xanax is not an acceptable alternative and discussed why  Handouts given re: anxiety, management, mindfulness as well inst to review info and then touch base re: plan  Is actively looking for new job and may also use vacation time to do that  Reassuring exam  She plans also to f/u with cardiologist as prev planned       Stress reaction    Worse anxiety/irritability See A/P

## 2021-03-08 NOTE — Patient Instructions (Addendum)
For anxiety  Self care is most important  Get back to exercise when you can (boot camp or whatever you like)   I like the Calm app for your phone   Here is info on Buspar (medication for anxiety and depressed mood) If you want to try it let us know    I highly encourage you to pursue counseling either EAP (with work) or from the list below :   [facility] [address] [place], [place] [address]  [phone]  [facility]/ Dr Lennice Sites Address: [address] [place], [place] [address] Phone: [phone]  [facility] Address: [address], [place], [place] [address] Phone: [phone]  [facility] [address] [place], [place] [address] [phone]

## 2021-03-10 ENCOUNTER — Ambulatory Visit: Payer: Managed Care, Other (non HMO) | Admitting: Cardiology

## 2021-03-18 ENCOUNTER — Other Ambulatory Visit: Payer: Self-pay

## 2021-03-18 ENCOUNTER — Ambulatory Visit (INDEPENDENT_AMBULATORY_CARE_PROVIDER_SITE_OTHER): Payer: Managed Care, Other (non HMO)

## 2021-03-18 DIAGNOSIS — I340 Nonrheumatic mitral (valve) insufficiency: Secondary | ICD-10-CM

## 2021-03-18 LAB — ECHOCARDIOGRAM COMPLETE
AR max vel: 2.27 cm2
AV Area VTI: 2.78 cm2
AV Area mean vel: 2.13 cm2
AV Mean grad: 3 mmHg
AV Peak grad: 5.7 mmHg
Ao pk vel: 1.19 m/s
Area-P 1/2: 2.33 cm2
Calc EF: 58.7 %
S' Lateral: 1.9 cm
Single Plane A2C EF: 60.6 %
Single Plane A4C EF: 57.7 %

## 2021-04-10 NOTE — Progress Notes (Signed)
Cardiology Office Note:    Date:  04/10/2021   ID:  Obie, Silos [birth year], MRN [id number]  PCP:  Abner Greenspan, MD  [facility] Cardiologist:  Kate Sable, MD  [facility] Electrophysiologist:  None   Referring MD: Abner Greenspan, MD   Chief Complaint: 12 month f/u  History of Present Illness:    Peggy Hicks is a 56 y.o. female with a hx of fibroids, mild to moderate mitral regurgitation who presents for a 72-month follow-up visit. She was last seen by Dr. Garen Lah 03/07/2020.   She was previously seen for dyspnea on exertion and echocardiogram showed mild to moderate MR, Lexiscan without any evidence of ischemia.  Repeat echocardiogram on 02/03/2020 showed no concerns.  She denied any chest pain or shortness of breath during her last appointment.  Today, she overall feel pretty good from a cardiac standpoint. She is going through some stressors in her life and has been not making her health a priority these last few years. 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 She is determined to loose some weight and decrease her LDL with lifestyle changes. We discussed following a mediterranean diet. We reviewed her most recent echocardiogram and that there were no changes to her mild MR. She does not have any symptoms currently. She occasionally has some lower extremity edema but thinks it may be due to sodium. We reviewed a low-sodium diet and provided some educational handouts today. She is committed to taking care of herself. We discussed getting her lipids drawn the next time she is at her PCP's office.   Reports no shortness of breath nor dyspnea on exertion. Reports no chest pain, pressure, or tightness. No significant edema, orthopnea, PND. Reports no palpitations.    Past Medical History:  Diagnosis Date   Allergy    Arthritis    knee   Fibroids    GERD (gastroesophageal reflux disease)    Heart valve regurgitation 2020   Hx of  adenomatous colonic polyps 04/14/2014   Hyperhidrosis    Stress reaction     Past Surgical History:  Procedure Laterality Date   BREAST BIOPSY     BREAST SURGERY     Breast Reduction   CESAREAN SECTION      X 2   COLONOSCOPY     HYSTERECTOMY ABDOMINAL WITH SALPINGECTOMY Bilateral 06/04/2018   Procedure: HYSTERECTOMY ABDOMINAL WITH SALPINGECTOMY;  Surgeon: Anastasio Auerbach, MD;  Location: [facility];  Service: Gynecology;  Laterality: Bilateral;   HYSTEROSCOPY     MYOMECTOMY     Uterine fibroids removed/ hysteroscopic submucous myomectomy   POLYPECTOMY     TUBAL LIGATION      Current Medications: No outpatient medications have been marked as taking for the 04/11/21 encounter (Appointment) with Kathlen Mody, Cadence H, PA-C.     Allergies:   Shrimp flavor   Social History   Socioeconomic History   Marital status: Married    Spouse name: Not on file   Number of children: Not on file   Years of education: Not on file   Highest education level: Not on file  Occupational History   Not on file  Tobacco Use   Smoking status: Never   Smokeless tobacco: Never  Vaping Use   Vaping Use: Never used  Substance and Sexual Activity   Alcohol use: Yes    Alcohol/week: 0.0 standard drinks    Comment: Rare   Drug use:  No   Sexual activity: Yes    Birth control/protection: Surgical    Comment: Tubal lig-1st intercourse 56 yo--Fewer than 5 partners  Other Topics Concern   Not on file  Social History Narrative   Exercises regularly   Social Determinants of Health   Financial Resource Strain: Not on file  Food Insecurity: Not on file  Transportation Needs: Not on file  Physical Activity: Not on file  Stress: Not on file  Social Connections: Not on file     Family History: The patient's family history includes Cancer in her maternal aunt; Colon polyps in her mother; Diabetes in her mother; Heart disease in her father and mother; Hypertension in her father and mother.  There is no history of Colon cancer, Esophageal cancer, Rectal cancer, or Stomach cancer.  ROS:   Please see the history of present illness.     All other systems reviewed and are negative.  EKGs/Labs/Other Studies Reviewed:    The following studies were reviewed today:  Echocardiogram 03/18/2021  IMPRESSIONS     1. Left ventricular ejection fraction, by estimation, is 60 to 65%. The  left ventricle has normal function. The left ventricle has no regional  wall motion abnormalities. There is moderate left ventricular hypertrophy.  Left ventricular diastolic  parameters are consistent with Grade II diastolic dysfunction  (pseudonormalization).   2. Right ventricular systolic function is normal. The right ventricular  size is normal. There is normal pulmonary artery systolic pressure. The  estimated right ventricular systolic pressure is 30.1 mmHg.   3. The mitral valve is myxomatous. Mild mitral valve regurgitation. No evidence of mitral stenosis. No significant prolapse noted.   4. Tricuspid valve regurgitation is mild to moderate.   5. The aortic valve is normal in structure. Aortic valve regurgitation is  not visualized. No aortic stenosis is present.   6. The inferior vena cava is normal in size with greater than 50%  respiratory variability, suggesting right atrial pressure of 3 mmHg.   FINDINGS   Left Ventricle: Left ventricular ejection fraction, by estimation, is 60  to 65%. The left ventricle has normal function. The left ventricle has no  regional wall motion abnormalities. The left ventricular internal cavity  size was normal in size. There is   moderate left ventricular hypertrophy. Left ventricular diastolic  parameters are consistent with Grade II diastolic dysfunction  (pseudonormalization).   Right Ventricle: The right ventricular size is normal. No increase in  right ventricular wall thickness. Right ventricular systolic function is  normal. There is normal  pulmonary artery systolic pressure. The tricuspid  regurgitant velocity is 2.58 m/s, and   with an assumed right atrial pressure of 5 mmHg, the estimated right  ventricular systolic pressure is 60.1 mmHg.   Left Atrium: Left atrial size was normal in size.   Right Atrium: Right atrial size was normal in size.   Pericardium: There is no evidence of pericardial effusion.   Mitral Valve: The mitral valve is myxomatous. There is mild thickening of  the mitral valve leaflet(s). Mild mitral valve regurgitation. No evidence  of mitral valve stenosis.   Tricuspid Valve: The tricuspid valve is normal in structure. Tricuspid  valve regurgitation is mild to moderate. No evidence of tricuspid  stenosis.   Aortic Valve: The aortic valve is normal in structure. Aortic valve  regurgitation is not visualized. No aortic stenosis is present. Aortic  valve mean gradient measures 3.0 mmHg. Aortic valve peak gradient measures  5.7 mmHg. Aortic valve area,  by VTI  measures 2.78 cm.   Pulmonic Valve: The pulmonic valve was normal in structure. Pulmonic valve  regurgitation is not visualized. No evidence of pulmonic stenosis.   Aorta: The aortic root is normal in size and structure.   Venous: The inferior vena cava is normal in size with greater than 50%  respiratory variability, suggesting right atrial pressure of 3 mmHg.   IAS/Shunts: No atrial level shunt detected by color flow Doppler.   EKG:  EKG is ordered today.  The ekg ordered today demonstrates NSR, rate 72, no change from last EKG  Recent Labs: 06/15/2020: ALT 8; BUN 16; Creatinine, Ser 0.80; Hemoglobin 14.2; Platelets 282; Potassium 5.2; Sodium 140; TSH 2.530  Recent Lipid Panel    Component Value Date/Time   CHOL 218 (H) 06/15/2020 0747   TRIG 94 06/15/2020 0747   HDL 75 06/15/2020 0747   CHOLHDL 2.9 06/15/2020 0747   CHOLHDL 2.9 05/21/2012 1432   VLDL 27 05/21/2012 1432   LDLCALC 127 (H) 06/15/2020 0747     Physical Exam:     VS:  LMP 03/03/2018 (Approximate)     Wt Readings from Last 3 Encounters:  03/08/21 192 lb 4 oz (87.2 kg)  01/03/21 189 lb (85.7 kg)  07/12/20 191 lb 1 oz (86.7 kg)     GEN:  Well nourished, well developed in no acute distress HEENT: Normal LYMPHATICS: No lymphadenopathy CARDIAC: RRR, no murmurs, rubs, gallops RESPIRATORY:  Clear to auscultation without rales, wheezing or rhonchi  ABDOMEN: Soft, non-tender, non-distended MUSCULOSKELETAL:  No edema; No deformity  SKIN: Warm and dry NEUROLOGIC:  Alert and oriented x 3 PSYCHIATRIC:  Normal affect   ASSESSMENT:    No diagnosis found. PLAN:    In order of problems listed above:  Mild mitral regurgitation -Recent echocardiogram 03/2021 -Currently asymptomatic   Hyperlipidemia -Recent lipid panel in March 2022 showed total cholesterol 218, HDL 75, LDL 127, and triglycerides 94. -Currently she is not on statin therapy but would like to try some lifestyle modifications  -Discussed a low-cholesterol, heart healthy diet -repeat 06/2021 per PCP  Prediabetes -Last hemoglobin A1c was 6.1 -Continue to monitor closely and highly suggest lifestyle modifications  Obesity -Discussed a low-sodium heart healthy diet -Encourage at least 150 minutes weekly of physical activity, or 30 minutes 5 days a week  Disposition: Follow up 1 year with Dr. Garen Lah    Signed, Cadence Ninfa Meeker, PA-C  04/10/2021 2:38 PM    [place]

## 2021-04-11 ENCOUNTER — Encounter: Payer: Self-pay | Admitting: Medical

## 2021-04-11 ENCOUNTER — Ambulatory Visit: Payer: Managed Care, Other (non HMO) | Admitting: Medical

## 2021-04-11 ENCOUNTER — Other Ambulatory Visit: Payer: Self-pay

## 2021-04-11 VITALS — BP 140/98 | HR 72 | Ht 65.5 in | Wt 193.0 lb

## 2021-04-11 DIAGNOSIS — E6609 Other obesity due to excess calories: Secondary | ICD-10-CM

## 2021-04-11 DIAGNOSIS — E782 Mixed hyperlipidemia: Secondary | ICD-10-CM | POA: Diagnosis not present

## 2021-04-11 DIAGNOSIS — I340 Nonrheumatic mitral (valve) insufficiency: Secondary | ICD-10-CM

## 2021-04-11 DIAGNOSIS — R7303 Prediabetes: Secondary | ICD-10-CM

## 2021-04-11 NOTE — Patient Instructions (Signed)
Medication Instructions:  Your physician recommends that you continue on your current medications as directed. Please refer to the Current Medication list given to you today.  *If you need a refill on your cardiac medications before your next appointment, please call your pharmacy*   Lab Work: Your physician recommends that you return for a FASTING lipid profile: in 2 months  - You will need to be fasting. Please do not have anything to eat or drink after midnight the morning you have the lab work. You may only have water or black coffee with no cream or sugar.   We will call you closer to your appointment time and schedule you in our office for this lab draw.  If you have labs (blood work) drawn today and your tests are completely normal, you will receive your results only by: [place] (if you have MyChart) OR A paper copy in the mail If you have any lab test that is abnormal or we need to change your treatment, we will call you to review the results.   Testing/Procedures: None ordered   Follow-Up: At [facility], you and your health needs are our priority.  As part of our continuing mission to provide you with exceptional heart care, we have created designated Provider Care Teams.  These Care Teams include your primary Cardiologist (physician) and Advanced Practice Providers (APPs -  Physician Assistants and Nurse Practitioners) who all work together to provide you with the care you need, when you need it.  We recommend signing up for the patient portal called "MyChart".  Sign up information is provided on this After Visit Summary.  MyChart is used to connect with patients for Virtual Visits (Telemedicine).  Patients are able to view lab/test results, encounter notes, upcoming appointments, etc.  Non-urgent messages can be sent to your provider as well.   To learn more about what you can do with MyChart, go to [email or web address].    Your next appointment:    Your  physician wants you to follow-up in: 1 year. You will receive a reminder letter in the mail two months in advance. If you don't receive a letter, please call our office to schedule the follow-up appointment.   The format for your next appointment:   In Person  Provider:   You may see Kate Sable, MD or one of the following Advanced Practice Providers on your designated Care Team:   Murray Hodgkins, NP Christell Faith, PA-C Cadence Kathlen Mody, PA-C :1}    Other Instructions:   Fat and Cholesterol Restricted Eating Plan Getting too much fat and cholesterol in your diet may cause health problems. Choosing the right foods helps keep your fat and cholesterol at normal levels. This can keep you from getting certain diseases. What are tips for following this plan? Meal planning At meals, divide your plate into four equal parts: Fill one-half of your plate with vegetables and green salads. Fill one-fourth of your plate with whole grains. Fill one-fourth of your plate with low-fat (lean) protein foods. Eat fish that is high in omega-3 fats at least two times a week. This includes mackerel, tuna, sardines, and salmon. Eat foods that are high in fiber, such as whole grains, beans, apples, broccoli, carrots, peas, and barley. General tips  Work with your doctor to lose weight if you need to. Avoid: Foods with added sugar. Fried foods. Foods with partially hydrogenated oils. Limit alcohol intake to no more than 1 drink a day for nonpregnant women and 2 drinks  a day for men. One drink equals 12 oz of beer, 5 oz of wine, or 1 oz of hard liquor. Reading food labels Check food labels for: Trans fats. Partially hydrogenated oils. Saturated fat (g) in each serving. Cholesterol (mg) in each serving. Fiber (g) in each serving. Choose foods with healthy fats, such as: Monounsaturated fats. Polyunsaturated fats. Omega-3 fats. Choose grain products that have whole grains. Look for the word "whole"  as the first word in the ingredient list. Cooking Cook foods using low-fat methods. These include baking, boiling, grilling, and broiling. Eat more home-cooked foods. Eat at restaurants and buffets less often. Avoid cooking using saturated fats, such as butter, cream, palm oil, palm kernel oil, and coconut oil. Recommended foods  Fruits All fresh, canned (in natural juice), or frozen fruits. Vegetables Fresh or frozen vegetables (raw, steamed, roasted, or grilled). Green salads. Grains Whole grains, such as whole wheat or whole grain breads, crackers, cereals, and pasta. Unsweetened oatmeal, bulgur, barley, quinoa, or brown rice. Corn or whole wheat flour tortillas. Meats and other protein foods Ground beef (85% or leaner), grass-fed beef, or beef trimmed of fat. Skinless chicken or [place]. Ground chicken or [place]. Pork trimmed of fat. All fish and seafood. Egg whites. Dried beans, peas, or lentils. Unsalted nuts or seeds. Unsalted canned beans. Nut butters without added sugar or oil. Dairy Low-fat or nonfat dairy products, such as skim or 1% milk, 2% or reduced-fat cheeses, low-fat and fat-free ricotta or cottage cheese, or plain low-fat and nonfat yogurt. Fats and oils Tub margarine without trans fats. Light or reduced-fat mayonnaise and salad dressings. Avocado. Olive, canola, sesame, or safflower oils. The items listed above may not be a complete list of foods and beverages you can eat. Contact a dietitian for more information. Foods to avoid Fruits Canned fruit in heavy syrup. Fruit in cream or butter sauce. Fried fruit. Vegetables Vegetables cooked in cheese, cream, or butter sauce. Fried vegetables. Grains White bread. White pasta. White rice. Cornbread. Bagels, pastries, and croissants. Crackers and snack foods that contain trans fat and hydrogenated oils. Meats and other protein foods Fatty cuts of meat. Ribs, chicken wings, bacon, sausage, bologna, salami, chitterlings,  fatback, hot dogs, bratwurst, and packaged lunch meats. Liver and organ meats. Whole eggs and egg yolks. Chicken and [place] with skin. Fried meat. Dairy Whole or 2% milk, cream, half-and-half, and cream cheese. Whole milk cheeses. Whole-fat or sweetened yogurt. Full-fat cheeses. Nondairy creamers and whipped toppings. Processed cheese, cheese spreads, and cheese curds. Beverages Alcohol. Sugar-sweetened drinks such as sodas, lemonade, and fruit drinks. Fats and oils Butter, stick margarine, lard, shortening, ghee, or bacon fat. Coconut, palm kernel, and palm oils. Sweets and desserts Corn syrup, sugars, honey, and molasses. Candy. Jam and jelly. Syrup. Sweetened cereals. Cookies, pies, cakes, donuts, muffins, and ice cream. The items listed above may not be a complete list of foods and beverages you should avoid. Contact a dietitian for more information. Summary Choosing the right foods helps keep your fat and cholesterol at normal levels. This can keep you from getting certain diseases. At meals, fill one-half of your plate with vegetables and green salads. Eat high-fiber foods, like whole grains, beans, apples, carrots, peas, and barley. Limit added sugar, saturated fats, alcohol, and fried foods. This information is not intended to replace advice given to you by your health care provider. Make sure you discuss any questions you have with your health care provider. Document Revised: 11/21/2017 Document Reviewed: 12/05/2016 Elsevier Patient Education  [place].

## 2021-05-18 ENCOUNTER — Ambulatory Visit (INDEPENDENT_AMBULATORY_CARE_PROVIDER_SITE_OTHER): Payer: BC Managed Care – PPO | Admitting: Family Medicine

## 2021-05-18 ENCOUNTER — Encounter: Payer: Self-pay | Admitting: Family Medicine

## 2021-05-18 ENCOUNTER — Other Ambulatory Visit: Payer: Self-pay

## 2021-05-18 VITALS — BP 124/80 | HR 64 | Temp 97.5°F | Ht 65.0 in | Wt 187.1 lb

## 2021-05-18 DIAGNOSIS — M7918 Myalgia, other site: Secondary | ICD-10-CM | POA: Insufficient documentation

## 2021-05-18 NOTE — Patient Instructions (Addendum)
If you do an exercise that causes buttock pain-back off for a week   Use some heat  Icy hot or ben gay is ok also  Aleve is ok- take with food   Try the rehab program for this   Keep working on weight loss    If no improvement in buttock pain /numbness in lower leg in 1-2 weeks let us know please/ we may consider sport medicine consult and imaging

## 2021-05-18 NOTE — Progress Notes (Signed)
Subjective:    Patient ID: Peggy Hicks, female    DOB: 02/21/1966, 56 y.o.   MRN: [id number]  This visit occurred during the SARS-CoV-2 public health emergency.  Safety protocols were in place, including screening questions prior to the visit, additional usage of staff PPE, and extensive cleaning of exam room while observing appropriate contact time as indicated for disinfecting solutions.   HPI Pt presents with c/o numbness in L calf   Wt Readings from Last 3 Encounters:  05/18/21 187 lb 2 oz (84.9 kg)  04/11/21 193 lb (87.5 kg)  03/08/21 192 lb 4 oz (87.2 kg)   31.14 kg/m  Since last visit she was let go from job at [employer]  A bad situation   Has been relaxing since then Doing some traveling before a while before job searching/taking a break  Would like to work with a start up company  Much less stressed now   Able to take card of herself now-feels better   Left lower leg  Feels funny in the calf  A numb feeling- lateral   Some pain in L low back and in her buttock area  Was doing some exercises on all 4 (hip abduction)  Has her PT lumbar exercises-does occasionally   Worse with activity  Doing a lot of walking 2 miles per day  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   [employer]   Patient Active Problem List   Diagnosis Date Noted   Piriformis muscle pain 05/18/2021   Hyperlipidemia 07/12/2020   Obesity (BMI 30-39.9) 01/20/2020   Dyspnea on exertion 01/01/2019   Globus sensation 01/01/2019   Daily headache 06/29/2017   Hx of adenomatous colonic polyps 04/14/2014   Routine general medical examination at a health care facility 01/01/2012   Stress reaction 11/13/2011   Prediabetes 11/15/2010   PRIMARY FOCAL HYPERHIDROSIS 10/22/2007   Low back pain 05/14/2007   Generalized anxiety disorder 08/01/2006   Past Medical History:  Diagnosis Date   Allergy    Arthritis    knee   Fibroids    GERD (gastroesophageal reflux disease)    Heart valve regurgitation  2020   Hx of adenomatous colonic polyps 04/14/2014   Hyperhidrosis    Stress reaction    Past Surgical History:  Procedure Laterality Date   BREAST BIOPSY     BREAST SURGERY     Breast Reduction   CESAREAN SECTION      X 2   COLONOSCOPY     HYSTERECTOMY ABDOMINAL WITH SALPINGECTOMY Bilateral 06/04/2018   Procedure: HYSTERECTOMY ABDOMINAL WITH SALPINGECTOMY;  Surgeon: Anastasio Auerbach, MD;  Location: [facility];  Service: Gynecology;  Laterality: Bilateral;   HYSTEROSCOPY     MYOMECTOMY     Uterine fibroids removed/ hysteroscopic submucous myomectomy   POLYPECTOMY     TUBAL LIGATION     Social History   Tobacco Use   Smoking status: Never   Smokeless tobacco: Never  Vaping Use   Vaping Use: Never used  Substance Use Topics   Alcohol use: Yes    Alcohol/week: 0.0 standard drinks    Comment: Rare   Drug use: No   Family History  Problem Relation Age of Onset   Hypertension Mother    Heart disease Mother    Diabetes Mother    Colon polyps Mother    Hypertension Father    Heart disease Father    Cancer Maternal Aunt        liver  Colon cancer Neg Hx    Esophageal cancer Neg Hx    Rectal cancer Neg Hx    Stomach cancer Neg Hx    Allergies  Allergen Reactions   Shrimp Flavor     Feels like throat closes up when eat shrimp   Current Outpatient Medications on File Prior to Visit  Medication Sig Dispense Refill   ALPRAZolam (XANAX) 0.5 MG tablet Take 1 tablet (0.5 mg total) by mouth daily as needed for anxiety. For traveling 30 tablet 0   cetirizine (ZYRTEC) 10 MG tablet Take 10 mg by mouth daily as needed for allergies.     ELDERBERRY PO Take by mouth. Takes occassionally     estradiol (ESTRACE VAGINAL) 0.1 MG/GM vaginal cream Place 1 Applicatorful vaginally at bedtime. Apply nightly x2 weeks, then every other night x2 weeks, then 2 nights per week for at least 3 months 42.5 g 12   famotidine (PEPCID) 40 MG tablet TAKE 1 TABLET BY MOUTH EVERY DAY 90  tablet 1   fluticasone (FLONASE) 50 MCG/ACT nasal spray Place 1 spray into both nostrils daily as needed for allergies or rhinitis.     Lactobacillus (DIGESTIVE HEALTH PROBIOTIC) CAPS Take 1 capsule by mouth daily.     naproxen sodium (ALEVE) 220 MG tablet Take 440 mg by mouth daily as needed (pain).     NON FORMULARY Menopause vitamins daily     Vitamin D, Cholecalciferol, 25 MCG (1000 UT) CAPS Take by mouth. Takes occasionally     No current facility-administered medications on file prior to visit.      Review of Systems  Constitutional:  Negative for activity change, appetite change, fatigue, fever and unexpected weight change.  HENT:  Negative for congestion, ear pain, rhinorrhea, sinus pressure and sore throat.   Eyes:  Negative for pain, redness and visual disturbance.  Respiratory:  Negative for cough, shortness of breath and wheezing.   Cardiovascular:  Negative for chest pain and palpitations.  Gastrointestinal:  Negative for abdominal pain, blood in stool, constipation and diarrhea.  Endocrine: Negative for polydipsia and polyuria.  Genitourinary:  Negative for dysuria, frequency and urgency.  Musculoskeletal:  Negative for arthralgias, back pain and myalgias.       Left buttock pain   Skin:  Negative for pallor and rash.  Allergic/Immunologic: Negative for environmental allergies.  Neurological:  Positive for numbness. Negative for dizziness, syncope, weakness and headaches.  Hematological:  Negative for adenopathy. Does not bruise/bleed easily.  Psychiatric/Behavioral:  Negative for decreased concentration and dysphoric mood. The patient is not nervous/anxious.       Objective:   Physical Exam Constitutional:      General: She is not in acute distress.    Appearance: Normal appearance. She is well-developed. She is obese. She is not ill-appearing.  HENT:     Head: Normocephalic and atraumatic.  Eyes:     General: No scleral icterus.    Conjunctiva/sclera:  Conjunctivae normal.     Pupils: Pupils are equal, round, and reactive to light.  Cardiovascular:     Rate and Rhythm: Normal rate and regular rhythm.  Pulmonary:     Effort: Pulmonary effort is normal.     Breath sounds: Normal breath sounds. No wheezing or rales.  Abdominal:     General: Bowel sounds are normal. There is no distension.     Palpations: Abdomen is soft.     Tenderness: There is no abdominal tenderness.  Musculoskeletal:        General: Tenderness  present.     Cervical back: Normal range of motion and neck supple.     Lumbar back: Spasms and tenderness present. No edema or bony tenderness. Decreased range of motion. Negative right straight leg raise test and negative left straight leg raise test.     Comments: Tender in L lumbar and SI musculature  Some pain with full ext of LS and also with ext rot of hip while flexed No scoliosis  Nl neuro exam Nl gait    Lymphadenopathy:     Cervical: No cervical adenopathy.  Skin:    General: Skin is warm and dry.     Coloration: Skin is not pale.     Findings: No erythema or rash.  Neurological:     Mental Status: She is alert.     Cranial Nerves: No cranial nerve deficit.     Sensory: No sensory deficit.     Motor: No atrophy or abnormal muscle tone.     Coordination: Coordination normal.     Deep Tendon Reflexes: Reflexes are normal and symmetric. Reflexes normal.     Comments: Negative SLR  No diff in sensation (bilat LE) incl light touch and pressure  Psychiatric:        Mood and Affect: Mood normal.          Assessment & Plan:   Problem List Items Addressed This Visit       Other   Piriformis muscle pain - Primary    Pain in L SI/piriformis area and radicular symptoms in L calf  Reassuring exam Discussed stretching and demonstrated some  Will back off activity that aggravates it (exercise wise) inst to continue her lumbar PT and keep walking Using aleve prn Adv heat prn Given exercise handout for  piriformis pain inst to update in 1-2 wk if not improving (earlier if worse)  Would consider imaging and PT in that case

## 2021-05-18 NOTE — Assessment & Plan Note (Signed)
Pain in L SI/piriformis area and radicular symptoms in L calf  Reassuring exam Discussed stretching and demonstrated some  Will back off activity that aggravates it (exercise wise) inst to continue her lumbar PT and keep walking Using aleve prn Adv heat prn Given exercise handout for piriformis pain inst to update in 1-2 wk if not improving (earlier if worse)  Would consider imaging and PT in that case

## 2021-06-07 ENCOUNTER — Other Ambulatory Visit (INDEPENDENT_AMBULATORY_CARE_PROVIDER_SITE_OTHER): Payer: BC Managed Care – PPO

## 2021-06-07 ENCOUNTER — Other Ambulatory Visit: Payer: Self-pay

## 2021-06-07 DIAGNOSIS — E782 Mixed hyperlipidemia: Secondary | ICD-10-CM | POA: Diagnosis not present

## 2021-06-08 LAB — LIPID PANEL
Chol/HDL Ratio: 2.9 ratio (ref 0.0–4.4)
Cholesterol, Total: 223 mg/dL — ABNORMAL HIGH (ref 100–199)
HDL: 78 mg/dL (ref >39)
LDL Chol Calc (NIH): 125 mg/dL — ABNORMAL HIGH (ref 0–99)
Triglycerides: 118 mg/dL (ref 0–149)
VLDL Cholesterol Cal: 20 mg/dL (ref 5–40)

## 2021-06-27 ENCOUNTER — Other Ambulatory Visit: Payer: Self-pay

## 2021-06-27 ENCOUNTER — Ambulatory Visit (INDEPENDENT_AMBULATORY_CARE_PROVIDER_SITE_OTHER): Payer: BC Managed Care – PPO | Admitting: Nurse Practitioner

## 2021-06-27 ENCOUNTER — Encounter: Payer: Self-pay | Admitting: Nurse Practitioner

## 2021-06-27 VITALS — BP 122/78 | Ht 65.5 in | Wt 189.0 lb

## 2021-06-27 DIAGNOSIS — Z780 Asymptomatic menopausal state: Secondary | ICD-10-CM

## 2021-06-27 DIAGNOSIS — Z01419 Encounter for gynecological examination (general) (routine) without abnormal findings: Secondary | ICD-10-CM

## 2021-06-27 NOTE — Progress Notes (Signed)
? ?  Peggy Hicks 10/12/1965 [id number] ? ? ?History:  56 y.o. G2P2002 presents for annual exam. Postmenopausal - no HRT. S/P 2020 TAH for fibroids with bilateral salpingectomies. Normal pap and mammogram history. GAD, prediabetes managed by PCP. HLD, mitral valve insufficiency managed by cardiology. ? ?Gynecologic History ?Patient's last menstrual period was 03/03/2018 (approximate). ?  ?Contraception/Family planning: status post hysterectomy ?Sexually active: Yes ? ?Health Maintenance ?Last Pap: 06/05/2019. Results were: Normal ?Last mammogram: 11/12/2020. Results were: Normal  ?Last colonoscopy: 06/12/2019. Results were: Adenoma, 5-year recall recommended ?Last Dexa: Never ? ?Past medical history, past surgical history, family history and social history were all reviewed and documented in the EPIC chart. Married. Recently let go from [employer], worked there for over 20 years. Looking for new position now. Youngest son moved to [place] for job, oldest living at home and looking for job.  ? ?ROS:  A ROS was performed and pertinent positives and negatives are included. ? ?Exam: ? ?Vitals:  ? 06/27/21 0831  ?BP: 122/78  ?Weight: 189 lb (85.7 kg)  ?Height: 5' 5.5" (1.664 m)  ? ? ?Body mass index is 30.97 kg/m?. ? ?General appearance:  Normal ?Thyroid:  Symmetrical, normal in size, without palpable masses or nodularity. ?Respiratory ? Auscultation:  Clear without wheezing or rhonchi ?Cardiovascular ? Auscultation:  Regular rate, without rubs, murmurs or gallops ? Edema/varicosities:  Not grossly evident ?Abdominal ? Soft,nontender, without masses, guarding or rebound. ? Liver/spleen:  No organomegaly noted ? Hernia:  None appreciated ? Skin ? Inspection:  Grossly normal ?  ?Breasts: Examined lying and sitting.  ? Right: Without masses, retractions, discharge or axillary adenopathy. ? ? Left: Without masses, retractions, discharge or axillary adenopathy. ?Genitourinary  ? Inguinal/mons:  Normal without inguinal adenopathy ? External  genitalia:  Normal appearing vulva with no masses, tenderness, or lesions ? BUS/Urethra/Skene's glands:  Normal ? Vagina:  Normal appearing with normal color and discharge, no lesions ? Cervix:  Absent ? Uterus:  Absent ? Adnexa/parametria:   ?  Rt: Normal in size, without masses or tenderness. ?  Lt: Normal in size, without masses or tenderness. ? Anus and perineum: Normal ? Digital rectal exam: Normal sphincter tone without palpated masses or tenderness ? ?Patient informed chaperone available to be present for breast and pelvic exam. Patient has requested no chaperone to be present. Patient has been advised what will be completed during breast and pelvic exam.  ? ?Assessment/Plan:  56 y.o. [id number] for annual exam.  ? ?Well female exam with routine gynecological exam - Education provided on SBEs, importance of preventative screenings, current guidelines, high calcium diet, regular exercise, and multivitamin daily. Labs with PCP.  ? ?Postmenopausal - having hot flashes, night sweats, and mood changes. Started OTC supplement called [employer] but has not been consistent with it. Recommend being consistent for 1-2 months. If no improvement we can discuss HRT.  ? ?Screening for cervical cancer - Normal Pap history.  No longer screening per guidelines.  ? ?Screening for breast cancer - Normal mammogram history.  Continue annual screenings.  Normal breast exam today. ? ?Screening for colon cancer - 2021 colonoscopy. Will repeat at GI's recommended interval.  ? ?Screening for osteoporosis - Low/average risk. Will plan for DXA at age 8. Does [employer]. Taking Vitamin D supplement and consumes high calcium diet.  ? ?Return in 1 year for annual.  ? ? ? ? ?Tamela Gammon DNP, 8:53 AM 06/27/2021 ? ?

## 2021-07-11 ENCOUNTER — Telehealth: Payer: Self-pay | Admitting: Family Medicine

## 2021-07-11 DIAGNOSIS — Z0000 Encounter for general adult medical examination without abnormal findings: Secondary | ICD-10-CM

## 2021-07-11 DIAGNOSIS — E7800 Pure hypercholesterolemia, unspecified: Secondary | ICD-10-CM

## 2021-07-11 DIAGNOSIS — R7303 Prediabetes: Secondary | ICD-10-CM

## 2021-07-11 NOTE — Telephone Encounter (Signed)
-----   Message from Ellamae Sia sent at 06/28/2021  2:35 PM EDT ----- ?Regarding: Lab orders for Tuesday, 4.11.23 ?Patient is scheduled for CPX labs, please order future labs, Thanks , Terri ? ? ?

## 2021-07-12 ENCOUNTER — Other Ambulatory Visit (INDEPENDENT_AMBULATORY_CARE_PROVIDER_SITE_OTHER): Payer: BC Managed Care – PPO

## 2021-07-12 DIAGNOSIS — H524 Presbyopia: Secondary | ICD-10-CM | POA: Diagnosis not present

## 2021-07-12 DIAGNOSIS — H52223 Regular astigmatism, bilateral: Secondary | ICD-10-CM | POA: Diagnosis not present

## 2021-07-12 DIAGNOSIS — H10232 Serous conjunctivitis, except viral, left eye: Secondary | ICD-10-CM | POA: Diagnosis not present

## 2021-07-12 DIAGNOSIS — E7800 Pure hypercholesterolemia, unspecified: Secondary | ICD-10-CM

## 2021-07-12 DIAGNOSIS — Z0000 Encounter for general adult medical examination without abnormal findings: Secondary | ICD-10-CM

## 2021-07-12 DIAGNOSIS — R7303 Prediabetes: Secondary | ICD-10-CM

## 2021-07-12 DIAGNOSIS — H5213 Myopia, bilateral: Secondary | ICD-10-CM | POA: Diagnosis not present

## 2021-07-12 NOTE — Addendum Note (Signed)
Addended by: Ellamae Sia on: 07/12/2021 07:40 AM ? ? Modules accepted: Orders ? ?

## 2021-07-13 LAB — CBC WITH DIFFERENTIAL/PLATELET
Basophils Absolute: 0 10*3/uL (ref 0.0–0.2)
Basos: 1 %
EOS (ABSOLUTE): 0.1 10*3/uL (ref 0.0–0.4)
Eos: 1 %
Hematocrit: 43.7 % (ref 34.0–46.6)
Hemoglobin: 14.3 g/dL (ref 11.1–15.9)
Immature Grans (Abs): 0 10*3/uL (ref 0.0–0.1)
Immature Granulocytes: 0 %
Lymphocytes Absolute: 2.7 10*3/uL (ref 0.7–3.1)
Lymphs: 41 %
MCH: 29 pg (ref 26.6–33.0)
MCHC: 32.7 g/dL (ref 31.5–35.7)
MCV: 89 fL (ref 79–97)
Monocytes Absolute: 0.4 10*3/uL (ref 0.1–0.9)
Monocytes: 5 %
Neutrophils Absolute: 3.5 10*3/uL (ref 1.4–7.0)
Neutrophils: 52 %
Platelets: 280 10*3/uL (ref 150–450)
RBC: 4.93 x10E6/uL (ref 3.77–5.28)
RDW: 13.3 % (ref 11.7–15.4)
WBC: 6.6 10*3/uL (ref 3.4–10.8)

## 2021-07-13 LAB — COMPREHENSIVE METABOLIC PANEL
ALT: 15 IU/L (ref 0–32)
AST: 19 IU/L (ref 0–40)
Albumin/Globulin Ratio: 1.6 (ref 1.2–2.2)
Albumin: 4.2 g/dL (ref 3.8–4.9)
Alkaline Phosphatase: 100 IU/L (ref 44–121)
BUN/Creatinine Ratio: 14 (ref 9–23)
BUN: 13 mg/dL (ref 6–24)
Bilirubin Total: 0.4 mg/dL (ref 0.0–1.2)
CO2: 26 mmol/L (ref 20–29)
Calcium: 9.7 mg/dL (ref 8.7–10.2)
Chloride: 103 mmol/L (ref 96–106)
Creatinine, Ser: 0.9 mg/dL (ref 0.57–1.00)
Globulin, Total: 2.7 g/dL (ref 1.5–4.5)
Glucose: 97 mg/dL (ref 70–99)
Potassium: 4.8 mmol/L (ref 3.5–5.2)
Sodium: 140 mmol/L (ref 134–144)
Total Protein: 6.9 g/dL (ref 6.0–8.5)
eGFR: 75 mL/min/{1.73_m2} (ref >59)

## 2021-07-13 LAB — LIPID PANEL
Chol/HDL Ratio: 3 ratio (ref 0.0–4.4)
Cholesterol, Total: 235 mg/dL — ABNORMAL HIGH (ref 100–199)
HDL: 79 mg/dL (ref >39)
LDL Chol Calc (NIH): 137 mg/dL — ABNORMAL HIGH (ref 0–99)
Triglycerides: 111 mg/dL (ref 0–149)
VLDL Cholesterol Cal: 19 mg/dL (ref 5–40)

## 2021-07-13 LAB — TSH: TSH: 3.52 u[IU]/mL (ref 0.450–4.500)

## 2021-07-13 LAB — HEMOGLOBIN A1C
Est. average glucose Bld gHb Est-mCnc: 126 mg/dL
Hgb A1c MFr Bld: 6 % — ABNORMAL HIGH (ref 4.8–5.6)

## 2021-07-18 DIAGNOSIS — H52223 Regular astigmatism, bilateral: Secondary | ICD-10-CM | POA: Diagnosis not present

## 2021-07-18 DIAGNOSIS — H33303 Unspecified retinal break, bilateral: Secondary | ICD-10-CM | POA: Diagnosis not present

## 2021-07-18 DIAGNOSIS — H5213 Myopia, bilateral: Secondary | ICD-10-CM | POA: Diagnosis not present

## 2021-07-18 DIAGNOSIS — H10232 Serous conjunctivitis, except viral, left eye: Secondary | ICD-10-CM | POA: Diagnosis not present

## 2021-07-18 DIAGNOSIS — H524 Presbyopia: Secondary | ICD-10-CM | POA: Diagnosis not present

## 2021-07-19 ENCOUNTER — Encounter: Payer: Self-pay | Admitting: Family Medicine

## 2021-07-19 ENCOUNTER — Ambulatory Visit (INDEPENDENT_AMBULATORY_CARE_PROVIDER_SITE_OTHER): Payer: Self-pay | Admitting: Family Medicine

## 2021-07-19 VITALS — BP 122/86 | HR 72 | Temp 97.5°F | Ht 65.0 in | Wt 185.2 lb

## 2021-07-19 DIAGNOSIS — R7303 Prediabetes: Secondary | ICD-10-CM

## 2021-07-19 DIAGNOSIS — E7800 Pure hypercholesterolemia, unspecified: Secondary | ICD-10-CM | POA: Diagnosis not present

## 2021-07-19 DIAGNOSIS — Z0000 Encounter for general adult medical examination without abnormal findings: Secondary | ICD-10-CM | POA: Diagnosis not present

## 2021-07-19 DIAGNOSIS — F411 Generalized anxiety disorder: Secondary | ICD-10-CM

## 2021-07-19 DIAGNOSIS — E669 Obesity, unspecified: Secondary | ICD-10-CM | POA: Diagnosis not present

## 2021-07-19 DIAGNOSIS — M7918 Myalgia, other site: Secondary | ICD-10-CM

## 2021-07-19 NOTE — Patient Instructions (Addendum)
If you are interested in the shingles vaccine series (Shingrix), call your insurance or pharmacy to check on coverage and location it must be given.  If affordable - you can schedule it here or at your pharmacy depending on coverage  ? ?For cholesterol ?Avoid red meat/ fried foods/ egg yolks/ fatty breakfast meats/ butter, cheese and high fat dairy/ and shellfish   ? ?For diabetes prevention  ?Try to get most of your carbohydrates from produce (with the exception of white potatoes)  ?Eat less bread/pasta/rice/snack foods/cereals/sweets and other items from the middle of the grocery store (processed carbs) ? ? ? ?

## 2021-07-19 NOTE — Assessment & Plan Note (Signed)
Lab Results  ?Component Value Date  ? HGBA1C 6.0 (H) 07/12/2021  ? ?disc imp of low glycemic diet and wt loss to prevent DM2 ? ?

## 2021-07-19 NOTE — Progress Notes (Signed)
? ?Subjective:  ? ? Patient ID: Peggy Hicks, female    DOB: 1966/02/24, 56 y.o.   MRN: [id number] ? ?HPI ?Here for health maintenance exam and to review chronic medical problems   ? ?Wt Readings from Last 3 Encounters:  ?07/19/21 185 lb 4 oz (84 kg)  ?06/27/21 189 lb (85.7 kg)  ?05/18/21 187 lb 2 oz (84.9 kg)  ? ?30.83 kg/m? ?Lost some weight with diet/exercise  ?Cut portion  ?Also stopped snacking at night   (going to bed earlier)  ? ?Feeling good overall  ? ?Occ R ear hurts  ?Still has pain in her leg - improved but still there  ?Going to boot camp again  ?Not ready for PT yet  ? ?Allergies are not too bad  ? ?Zoster status : is interested in shingrix  ?Tdap 2018 ? ?Colonoscopy 06/2019 with 7 y recall ? ? ?Mammogram 11/2020  ?Self breast exam : no lumps  ? ?No pap/had hysterectomy  ?Had gyn visit with exam in march ?Hot flashes are bad  ? ? ?Takes 1000 iu of vitamin D daily  ? ?BP Readings from Last 3 Encounters:  ?07/19/21 122/86  ?06/27/21 122/78  ?05/18/21 124/80  ? ?Pulse Readings from Last 3 Encounters:  ?07/19/21 72  ?05/18/21 64  ?04/11/21 72  ? ? ? ?Hyperlipidemia ?Lab Results  ?Component Value Date  ? CHOL 235 (H) 07/12/2021  ? CHOL 223 (H) 06/07/2021  ? CHOL 218 (H) 06/15/2020  ? ?Lab Results  ?Component Value Date  ? HDL 79 07/12/2021  ? HDL 78 06/07/2021  ? HDL 75 06/15/2020  ? ?Lab Results  ?Component Value Date  ? LDLCALC 137 (H) 07/12/2021  ? LDLCALC 125 (H) 06/07/2021  ? LDLCALC 127 (H) 06/15/2020  ? ?Lab Results  ?Component Value Date  ? TRIG 111 07/12/2021  ? TRIG 118 06/07/2021  ? TRIG 94 06/15/2020  ? ?Lab Results  ?Component Value Date  ? CHOLHDL 3.0 07/12/2021  ? CHOLHDL 2.9 06/07/2021  ? CHOLHDL 2.9 06/15/2020  ? ?No results found for: LDLDIRECT ? ?Occ fried chicken  ?Does not eat red meat  ?Cooks with olive oil  ?Some bacon and sausage ([place] bacon)  ? ? ? ?Not eating worse  ? ?The 10-year ASCVD risk score (Arnett DK, et al., 2019) is: 2.9% ?  Values used to calculate the score: ?    Age:  51 years ?    Sex: Female ?    Is Non-Hispanic African American: Yes ?    Diabetic: No ?    Tobacco smoker: No ?    Systolic Blood Pressure: 517 mmHg ?    Is BP treated: No ?    HDL Cholesterol: 79 mg/dL ?    Total Cholesterol: 235 mg/dL ? ? ?Prediabetes ?Lab Results  ?Component Value Date  ? HGBA1C 6.0 (H) 07/12/2021  ? ? ?Other labs  ?Lab Results  ?Component Value Date  ? CREATININE 0.90 07/12/2021  ? BUN 13 07/12/2021  ? NA 140 07/12/2021  ? K 4.8 07/12/2021  ? CL 103 07/12/2021  ? CO2 26 07/12/2021  ? ?Lab Results  ?Component Value Date  ? ALT 15 07/12/2021  ? AST 19 07/12/2021  ? ALKPHOS 100 07/12/2021  ? BILITOT 0.4 07/12/2021  ? ?Lab Results  ?Component Value Date  ? WBC 6.6 07/12/2021  ? HGB 14.3 07/12/2021  ? HCT 43.7 07/12/2021  ? MCV 89 07/12/2021  ? PLT 280 07/12/2021  ?  ?Lab Results  ?Component Value  Date  ? TSH 3.520 07/12/2021  ?  ?Patient Active Problem List  ? Diagnosis Date Noted  ? Piriformis muscle pain 05/18/2021  ? Hyperlipidemia 07/12/2020  ? Obesity (BMI 30-39.9) 01/20/2020  ? Hx of adenomatous colonic polyps 04/14/2014  ? Routine general medical examination at a health care facility 01/01/2012  ? Stress reaction 11/13/2011  ? Prediabetes 11/15/2010  ? PRIMARY FOCAL HYPERHIDROSIS 10/22/2007  ? Generalized anxiety disorder 08/01/2006  ? ?Past Medical History:  ?Diagnosis Date  ? Allergy   ? Arthritis   ? knee  ? Fibroids   ? GERD (gastroesophageal reflux disease)   ? Heart valve regurgitation 2020  ? Hx of adenomatous colonic polyps 04/14/2014  ? Hyperhidrosis   ? Stress reaction   ? ?Past Surgical History:  ?Procedure Laterality Date  ? BREAST BIOPSY    ? BREAST SURGERY    ? Breast Reduction  ? CESAREAN SECTION    ?  X 2  ? COLONOSCOPY    ? HYSTERECTOMY ABDOMINAL WITH SALPINGECTOMY Bilateral 06/04/2018  ? Procedure: HYSTERECTOMY ABDOMINAL WITH SALPINGECTOMY;  Surgeon: Anastasio Auerbach, MD;  Location: [facility];  Service: Gynecology;  Laterality: Bilateral;  ? HYSTEROSCOPY     ? MYOMECTOMY    ? Uterine fibroids removed/ hysteroscopic submucous myomectomy  ? POLYPECTOMY    ? TUBAL LIGATION    ? ?Social History  ? ?Tobacco Use  ? Smoking status: Never  ? Smokeless tobacco: Never  ?Vaping Use  ? Vaping Use: Never used  ?Substance Use Topics  ? Alcohol use: Yes  ?  Alcohol/week: 0.0 standard drinks  ?  Comment: Rare  ? Drug use: No  ? ?Family History  ?Problem Relation Age of Onset  ? Hypertension Mother   ? Heart disease Mother   ? Diabetes Mother   ? Colon polyps Mother   ? Hypertension Father   ? Heart disease Father   ? Cancer Maternal Aunt   ?     liver  ? Colon cancer Neg Hx   ? Esophageal cancer Neg Hx   ? Rectal cancer Neg Hx   ? Stomach cancer Neg Hx   ? ?Allergies  ?Allergen Reactions  ? Shrimp Flavor   ?  Feels like throat closes up when eat shrimp  ? ?Current Outpatient Medications on File Prior to Visit  ?Medication Sig Dispense Refill  ? ALPRAZolam (XANAX) 0.5 MG tablet Take 1 tablet (0.5 mg total) by mouth daily as needed for anxiety. For traveling 30 tablet 0  ? cetirizine (ZYRTEC) 10 MG tablet Take 10 mg by mouth daily as needed for allergies.    ? ELDERBERRY PO Take by mouth. Takes occassionally    ? famotidine (PEPCID) 40 MG tablet TAKE 1 TABLET BY MOUTH EVERY DAY 90 tablet 1  ? fluticasone (FLONASE) 50 MCG/ACT nasal spray Place 1 spray into both nostrils daily as needed for allergies or rhinitis.    ? Lactobacillus (DIGESTIVE HEALTH PROBIOTIC) CAPS Take 1 capsule by mouth daily.    ? naproxen sodium (ALEVE) 220 MG tablet Take 440 mg by mouth daily as needed (pain).    ? NON FORMULARY Menopause vitamins daily    ? Vitamin D, Cholecalciferol, 25 MCG (1000 UT) CAPS Take by mouth. Takes occasionally    ? ?No current facility-administered medications on file prior to visit.  ?  ?Review of Systems  ?Constitutional:  Negative for activity change, appetite change, fatigue, fever and unexpected weight change.  ?HENT:  Negative for congestion, ear  pain, rhinorrhea, sinus pressure  and sore throat.   ?Eyes:  Negative for pain, redness and visual disturbance.  ?     Recent skin infection below L eye  ?On amox and drops from oph  ?Respiratory:  Negative for cough, shortness of breath and wheezing.   ?Cardiovascular:  Negative for chest pain and palpitations.  ?Gastrointestinal:  Negative for abdominal pain, blood in stool, constipation and diarrhea.  ?Endocrine: Negative for polydipsia and polyuria.  ?Genitourinary:  Negative for dysuria, frequency and urgency.  ?Musculoskeletal:  Negative for arthralgias, back pain and myalgias.  ?     Still some hip /SI pain-improved with exercise  ?Skin:  Negative for pallor and rash.  ?Allergic/Immunologic: Negative for environmental allergies.  ?Neurological:  Negative for dizziness, syncope and headaches.  ?Hematological:  Negative for adenopathy. Does not bruise/bleed easily.  ?Psychiatric/Behavioral:  Negative for decreased concentration and dysphoric mood. The patient is not nervous/anxious.   ? ?   ?Objective:  ? Physical Exam ?Constitutional:   ?   General: She is not in acute distress. ?   Appearance: Normal appearance. She is well-developed. She is obese. She is not ill-appearing or diaphoretic.  ?HENT:  ?   Head: Normocephalic and atraumatic.  ?   Right Ear: Tympanic membrane, ear canal and external ear normal.  ?   Left Ear: Tympanic membrane, ear canal and external ear normal.  ?   Ears:  ?   Comments: TMs are clear ?   Nose: Nose normal. No congestion.  ?   Mouth/Throat:  ?   Mouth: Mucous membranes are moist.  ?   Pharynx: Oropharynx is clear. No posterior oropharyngeal erythema.  ?Eyes:  ?   General: No scleral icterus. ?   Extraocular Movements: Extraocular movements intact.  ?   Conjunctiva/sclera: Conjunctivae normal.  ?   Pupils: Pupils are equal, round, and reactive to light.  ?Neck:  ?   Thyroid: No thyromegaly.  ?   Vascular: No carotid bruit or JVD.  ?Cardiovascular:  ?   Rate and Rhythm: Normal rate and regular rhythm.  ?   Pulses:  Normal pulses.  ?   Heart sounds: Normal heart sounds.  ?  No gallop.  ?Pulmonary:  ?   Effort: Pulmonary effort is normal. No respiratory distress.  ?   Breath sounds: Normal breath sounds. No wheezing.

## 2021-07-19 NOTE — Assessment & Plan Note (Signed)
Disc goals for lipids and reasons to control them ?Rev last labs with pt ?Rev low sat fat diet in detail ? ?LDL is up into the 130s ?Patient will work on diet and try to cut back bacon and sausage ?Consider statin if no improvement in a year ?

## 2021-07-19 NOTE — Assessment & Plan Note (Signed)
Doing better without the stress of work currently ?

## 2021-07-19 NOTE — Assessment & Plan Note (Signed)
Reviewed health habits including diet and exercise and skin cancer prevention ?Reviewed appropriate screening tests for age  ?Also reviewed health mt list, fam hx and immunization status , as well as social and family history   ?See HPI ?Labs reviewed ?Interested in the Shingrix vaccine, patient will check on coverage with her pharmacy ?Colonoscopy is up-to-date from 2021 with a 7-year recall ?Mammogram is up-to-date and due in August ?Had her GYN visit recently as well ?Encouraged her to continue vitamin D for bone health and continue exercise ?

## 2021-07-19 NOTE — Assessment & Plan Note (Signed)
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

## 2021-07-19 NOTE — Assessment & Plan Note (Signed)
Discussed how this problem influences overall health and the risks it imposes  Reviewed plan for weight loss with lower calorie diet (via better food choices and also portion control or program like weight watchers) and exercise building up to or more than 30 minutes 5 days per week including some aerobic activity    

## 2021-08-16 ENCOUNTER — Ambulatory Visit: Payer: BC Managed Care – PPO | Admitting: Family Medicine

## 2021-08-16 ENCOUNTER — Ambulatory Visit (INDEPENDENT_AMBULATORY_CARE_PROVIDER_SITE_OTHER): Payer: BC Managed Care – PPO

## 2021-08-16 DIAGNOSIS — Z23 Encounter for immunization: Secondary | ICD-10-CM | POA: Diagnosis not present

## 2021-08-16 NOTE — Progress Notes (Signed)
Per orders of Dr. Glori Bickers, Shingrix #1 vaccine given by Kris Mouton. ?Patient tolerated injection well.  ?

## 2021-11-14 ENCOUNTER — Ambulatory Visit (INDEPENDENT_AMBULATORY_CARE_PROVIDER_SITE_OTHER): Payer: BC Managed Care – PPO | Admitting: Family Medicine

## 2021-11-14 ENCOUNTER — Encounter: Payer: Self-pay | Admitting: Family Medicine

## 2021-11-14 ENCOUNTER — Telehealth: Payer: Self-pay

## 2021-11-14 VITALS — BP 116/72 | HR 60 | Temp 98.8°F | Ht 65.0 in | Wt 190.4 lb

## 2021-11-14 DIAGNOSIS — R300 Dysuria: Secondary | ICD-10-CM | POA: Diagnosis not present

## 2021-11-14 LAB — POC URINALSYSI DIPSTICK (AUTOMATED)
Bilirubin, UA: NEGATIVE
Blood, UA: NEGATIVE
Glucose, UA: NEGATIVE
Ketones, UA: NEGATIVE
Leukocytes, UA: NEGATIVE
Nitrite, UA: NEGATIVE
Protein, UA: NEGATIVE
Spec Grav, UA: 1.01 (ref 1.010–1.025)
Urobilinogen, UA: 0.2 E.U./dL
pH, UA: 6 (ref 5.0–8.0)

## 2021-11-14 MED ORDER — SULFAMETHOXAZOLE-TRIMETHOPRIM 800-160 MG PO TABS: 1.0000 | ORAL_TABLET | Freq: Two times a day (BID) | ORAL | 0 refills | Status: DC

## 2021-11-14 NOTE — Addendum Note (Signed)
Addended by: Ellamae Sia on: 11/14/2021 11:27 AM   Modules accepted: Orders

## 2021-11-14 NOTE — Telephone Encounter (Signed)
Reviewed patient Chart has appointment today with Dr. Lorelei Pont. No further action needed at this time.

## 2021-11-14 NOTE — Progress Notes (Signed)
    Jordan Caraveo T. Peggy Carthen, MD, Peggy Hicks [place], [address]  Phone: [phone]  FAX: [place] - 56 y.o. female  MRN [id number]  Date of Birth: 1965/06/29  Date: 11/14/2021  PCP: Abner Greenspan, MD  Referral: Abner Greenspan, MD  Chief Complaint  Patient presents with   Urinary Tract Infection    Burning with urination   Subjective:   Peggy Hicks is a 56 y.o. very pleasant female patient with Body mass index is 31.68 kg/m. who presents with the following:  Going to the bathroom a lot.  Some urgency, some burning.  She feels like she has to go to the bathroom more than normal, and she has been drinking a lot of water.  Did take some AZO - 2 this morning. She does not have frequent UTIs, but she has had them in the past. No other systemic symptoms.  Review of Systems is noted in the HPI, as appropriate  Objective:   BP 116/72 (BP Location: Left Arm, Patient Position: Sitting)   Pulse 60   Temp 98.8 F (37.1 C) (Oral)   Ht '5\' 5"'$  (1.651 m)   Wt 190 lb 6 oz (86.4 kg)   LMP 03/03/2018 (Approximate)   SpO2 (!) 10%   BMI 31.68 kg/m   GEN: No acute distress; alert,appropriate. PULM: Breathing comfortably in no respiratory distress PSYCH: Normally interactive.  No CVAT  Laboratory and Imaging Data:  Assessment and Plan:     ICD-10-CM   1. Burning with urination  R30.0 POCT Urinalysis Dipstick (Automated)    Urine Culture     Probable cystitis, treat as such, obtain urine culture.  Medication Management during today's office visit: Meds ordered this encounter  Medications   sulfamethoxazole-trimethoprim (BACTRIM DS) 800-160 MG tablet    Sig: Take 1 tablet by mouth 2 (two) times daily.    Dispense:  14 tablet    Refill:  0   There are no discontinued medications.  Orders placed today for conditions managed today: Orders Placed This Encounter  Procedures   Urine  Culture   POCT Urinalysis Dipstick (Automated)    Follow-up if needed: No follow-ups on file.  Dr[employer] One speech-to-text software was used for transcription in this dictation.  Possible transcriptional errors can occur using Editor, commissioning.   Signed,  Maud Deed. Genean Adamski, MD   Outpatient Encounter Medications as of 11/14/2021  Medication Sig   ALPRAZolam (XANAX) 0.5 MG tablet Take 1 tablet (0.5 mg total) by mouth daily as needed for anxiety. For traveling   cetirizine (ZYRTEC) 10 MG tablet Take 10 mg by mouth daily as needed for allergies.   ELDERBERRY PO Take by mouth. Takes occassionally   famotidine (PEPCID) 40 MG tablet TAKE 1 TABLET BY MOUTH EVERY DAY   fluticasone (FLONASE) 50 MCG/ACT nasal spray Place 1 spray into both nostrils daily as needed for allergies or rhinitis.   Lactobacillus (DIGESTIVE HEALTH PROBIOTIC) CAPS Take 1 capsule by mouth daily.   naproxen sodium (ALEVE) 220 MG tablet Take 440 mg by mouth daily as needed (pain).   NON FORMULARY Menopause vitamins daily   sulfamethoxazole-trimethoprim (BACTRIM DS) 800-160 MG tablet Take 1 tablet by mouth 2 (two) times daily.   Vitamin D, Cholecalciferol, 25 MCG (1000 UT) CAPS Take by mouth. Takes occasionally   No facility-administered encounter medications on file as of 11/14/2021.

## 2021-11-16 ENCOUNTER — Ambulatory Visit (INDEPENDENT_AMBULATORY_CARE_PROVIDER_SITE_OTHER): Payer: BC Managed Care – PPO | Admitting: Nurse Practitioner

## 2021-11-16 ENCOUNTER — Telehealth: Payer: Self-pay

## 2021-11-16 ENCOUNTER — Encounter: Payer: Self-pay | Admitting: Nurse Practitioner

## 2021-11-16 VITALS — BP 112/74 | HR 77

## 2021-11-16 DIAGNOSIS — N9489 Other specified conditions associated with female genital organs and menstrual cycle: Secondary | ICD-10-CM

## 2021-11-16 DIAGNOSIS — N760 Acute vaginitis: Secondary | ICD-10-CM | POA: Diagnosis not present

## 2021-11-16 LAB — WET PREP FOR TRICH, YEAST, CLUE

## 2021-11-16 LAB — URINE CULTURE

## 2021-11-16 MED ORDER — CLOBETASOL PROPIONATE 0.05 % EX OINT: 1.0000 | TOPICAL_OINTMENT | Freq: Two times a day (BID) | CUTANEOUS | 0 refills | Status: AC

## 2021-11-16 NOTE — Telephone Encounter (Signed)
Called and spoke with patient.

## 2021-11-16 NOTE — Telephone Encounter (Signed)
Patient called to return a call.

## 2021-11-16 NOTE — Progress Notes (Signed)
   Acute Office Visit  Subjective:    Patient ID: Peggy Hicks, female    DOB: [birth year], 56 y.o.   MRN: [id number]   HPI 56 y.o. presents today for vulvar burning, mostly with urination. Feels like a "zing". She also has increased frequency but she has increased water intake to 1 gallon per day. Denies itching or odor. Did notice some yellow discharge. Negative UA at PCP office 2 days ago, on Bactrim. Reports switching body wash recently.    Review of Systems  Constitutional: Negative.   Genitourinary:  Positive for dysuria, vaginal discharge and vaginal pain (Vulvar burning).       Objective:    Physical Exam Constitutional:      Appearance: Normal appearance.  Genitourinary:    General: Normal vulva.     Vagina: Vaginal discharge (White, creamy) present.     BP 112/74   Pulse 77   LMP 03/03/2018 (Approximate)   SpO2 98%  Wt Readings from Last 3 Encounters:  11/14/21 190 lb 6 oz (86.4 kg)  07/19/21 185 lb 4 oz (84 kg)  06/27/21 189 lb (85.7 kg)        Patient informed chaperone available to be present for breast and/or pelvic exam. Patient has requested no chaperone to be present. Patient has been advised what will be completed during breast and pelvic exam.   Wet prep negative  Assessment & Plan:   Problem List Items Addressed This Visit   None Visit Diagnoses     Acute vaginitis    -  Primary   Relevant Medications   clobetasol ointment (TEMOVATE) 0.05 %   Vulvar burning       Relevant Orders   WET PREP FOR TRICH, YEAST, CLUE      Plan: Negative wet prep. Mild redness around introitus. Recommend Clobetasol BID x 7 days. Increased urination likely due to increased water intake. Stop Bactrim. Will follow up if symptoms do not improve.      [place], 10:18 AM 11/16/2021

## 2021-11-16 NOTE — Telephone Encounter (Signed)
I left a message for the patient to return my call.

## 2021-11-16 NOTE — Telephone Encounter (Signed)
-----   Message from Abner Greenspan, MD sent at 11/16/2021  8:39 AM EDT ----- Your urine culture grew some normal bacteria (flora) but did not indicate uti  How are your symptoms with the antibiotic?

## 2021-11-17 ENCOUNTER — Ambulatory Visit (INDEPENDENT_AMBULATORY_CARE_PROVIDER_SITE_OTHER): Payer: BC Managed Care – PPO

## 2021-11-17 DIAGNOSIS — Z23 Encounter for immunization: Secondary | ICD-10-CM

## 2021-11-18 ENCOUNTER — Encounter: Payer: Self-pay | Admitting: Family Medicine

## 2022-01-20 ENCOUNTER — Other Ambulatory Visit: Payer: Self-pay

## 2022-01-20 ENCOUNTER — Emergency Department: Payer: BC Managed Care – PPO

## 2022-01-20 ENCOUNTER — Emergency Department
Admission: EM | Admit: 2022-01-20 | Discharge: 2022-01-20 | Disposition: A | Discharge status: Home / Self Care | Payer: BC Managed Care – PPO | Attending: Emergency Medicine | Admitting: Emergency Medicine

## 2022-01-20 ENCOUNTER — Encounter: Payer: Self-pay | Admitting: *Deleted

## 2022-01-20 DIAGNOSIS — I10 Essential (primary) hypertension: Secondary | ICD-10-CM | POA: Insufficient documentation

## 2022-01-20 DIAGNOSIS — R0789 Other chest pain: Secondary | ICD-10-CM | POA: Diagnosis present

## 2022-01-20 LAB — CBC WITH DIFFERENTIAL/PLATELET
Abs Immature Granulocytes: 0.03 10*3/uL (ref 0.00–0.07)
Basophils Absolute: 0 10*3/uL (ref 0.0–0.1)
Basophils Relative: 1 %
Eosinophils Absolute: 0.1 10*3/uL (ref 0.0–0.5)
Eosinophils Relative: 1 %
HCT: 44.8 % (ref 36.0–46.0)
Hemoglobin: 14.6 g/dL (ref 12.0–15.0)
Immature Granulocytes: 0 %
Lymphocytes Relative: 45 %
Lymphs Abs: 4 10*3/uL (ref 0.7–4.0)
MCH: 28.7 pg (ref 26.0–34.0)
MCHC: 32.6 g/dL (ref 30.0–36.0)
MCV: 88.2 fL (ref 80.0–100.0)
Monocytes Absolute: 0.4 10*3/uL (ref 0.1–1.0)
Monocytes Relative: 5 %
Neutro Abs: 4.3 10*3/uL (ref 1.7–7.7)
Neutrophils Relative %: 48 %
Platelets: 289 10*3/uL (ref 150–400)
RBC: 5.08 MIL/uL (ref 3.87–5.11)
RDW: 12.4 % (ref 11.5–15.5)
WBC: 8.8 10*3/uL (ref 4.0–10.5)
nRBC: 0 % (ref 0.0–0.2)

## 2022-01-20 LAB — COMPREHENSIVE METABOLIC PANEL
ALT: 15 U/L (ref 0–44)
AST: 20 U/L (ref 15–41)
Albumin: 4.3 g/dL (ref 3.5–5.0)
Alkaline Phosphatase: 85 U/L (ref 38–126)
Anion gap: 8 (ref 5–15)
BUN: 10 mg/dL (ref 6–20)
CO2: 26 mmol/L (ref 22–32)
Calcium: 9.8 mg/dL (ref 8.9–10.3)
Chloride: 101 mmol/L (ref 98–111)
Creatinine, Ser: 0.86 mg/dL (ref 0.44–1.00)
GFR, Estimated: >60 mL/min (ref >60)
Glucose, Bld: 113 mg/dL — ABNORMAL HIGH (ref 70–99)
Potassium: 4.1 mmol/L (ref 3.5–5.1)
Sodium: 135 mmol/L (ref 135–145)
Total Bilirubin: 0.6 mg/dL (ref 0.3–1.2)
Total Protein: 7.5 g/dL (ref 6.5–8.1)

## 2022-01-20 LAB — LIPASE, BLOOD: Lipase: 30 U/L (ref 11–51)

## 2022-01-20 LAB — TROPONIN I (HIGH SENSITIVITY): Troponin I (High Sensitivity): 3 ng/L (ref <18)

## 2022-01-20 MED ORDER — PANTOPRAZOLE SODIUM 40 MG PO TBEC: 40.0000 mg | DELAYED_RELEASE_TABLET | Freq: Every day | ORAL | 1 refills | Status: DC

## 2022-01-20 NOTE — ED Provider Triage Note (Signed)
Emergency Medicine Provider Triage Evaluation Note  Peggy Hicks , a 56 y.o. female  was evaluated in triage.  Pt complains of chest pain x3 days. Reports that it is intermittent though cannot identify any exacerbating or relieving factors. Reports its central, non-radiating. Feels SOB with it, no diaphoresis. No nausea or vomiting. Also has epigastric pain that began while in the ER. Has not had any leg swelling.  Patient Active Problem List   Diagnosis Date Noted   Piriformis muscle pain 05/18/2021   Hyperlipidemia 07/12/2020   Obesity (BMI 30-39.9) 01/20/2020   Hx of adenomatous colonic polyps 04/14/2014   Routine general medical examination at a health care facility 01/01/2012   Stress reaction 11/13/2011   Prediabetes 11/15/2010   PRIMARY FOCAL HYPERHIDROSIS 10/22/2007   Generalized anxiety disorder 08/01/2006     Review of Systems  Positive: CP, epigastric pain, SOB Negative: Leg swelling, vomiting  Physical Exam  Ht '5\' 5"'$  (1.651 m)   Wt 81.2 kg   LMP 03/03/2018 (Approximate)   BMI 29.79 kg/m  Gen:   Awake, no distress   Resp:  Normal effort  MSK:   Moves extremities without difficulty  Other:    Medical Decision Making  Medically screening exam initiated at 12:43 PM.  Appropriate orders placed.  Peggy Hicks was informed that the remainder of the evaluation will be completed by another provider, this initial triage assessment does not replace that evaluation, and the importance of remaining in the ED until their evaluation is complete.     Marquette Old, PA-C 01/20/22 1245

## 2022-01-20 NOTE — ED Provider Notes (Signed)
[facility] Provider Note    Event Date/Time   First MD Initiated Contact with Patient 01/20/22 1411     (approximate)   History   Chest Pain   HPI  Peggy Hicks is a 56 y.o. female with a history of generalized anxiety disorder, hypertension presents with complaints of chest discomfort for the last 3 days.  She describes it as a burning sensation, intermittent, worse when lying down.  Currently is asymptomatic.  Denies shortness of breath, no pleurisy, no lower extremity swelling.     Physical Exam   Triage Vital Signs: ED Triage Vitals  Enc Vitals Group     BP 01/20/22 1240 (!) 164/112     Pulse Rate 01/20/22 1240 72     Resp 01/20/22 1240 18     Temp 01/20/22 1240 97.7 F (36.5 C)     Temp Source 01/20/22 1240 Oral     SpO2 01/20/22 1240 100 %     Weight 01/20/22 1236 81.2 kg (179 lb)     Height 01/20/22 1236 1.651 m ('5\' 5"'$ )     Head Circumference --      Peak Flow --      Pain Score 01/20/22 1235 0     Pain Loc --      Pain Edu? --      Excl. in [place]? --     Most recent vital signs: Vitals:   01/20/22 1240  BP: (!) 164/112  Pulse: 72  Resp: 18  Temp: 97.7 F (36.5 C)  SpO2: 100%     General: Awake, no distress.  CV:  Good peripheral perfusion.  Resp:  Normal effort.  Abd:  No distention.  Other:     ED Results / Procedures / Treatments   Labs (all labs ordered are listed, but only abnormal results are displayed) Labs Reviewed  COMPREHENSIVE METABOLIC PANEL - Abnormal; Notable for the following components:      Result Value   Glucose, Bld 113 (*)    All other components within normal limits  CBC WITH DIFFERENTIAL/PLATELET  LIPASE, BLOOD  TROPONIN I (HIGH SENSITIVITY)     EKG  ED ECG REPORT I, Lavonia Drafts, the attending physician, personally viewed and interpreted this ECG.  Date: 01/20/2022  Rhythm: normal sinus rhythm QRS Axis: normal Intervals: normal ST/T Wave abnormalities: normal Narrative  Interpretation: no evidence of acute ischemia    RADIOLOGY Chest x-ray viewed interpreted by me, no acute abnormality    PROCEDURES:  Critical Care performed:   Procedures   MEDICATIONS ORDERED IN ED: Medications - No data to display   IMPRESSION / MDM / [place] / ED COURSE  I reviewed the triage vital signs and the nursing notes. Patient's presentation is most consistent with acute presentation with potential threat to life or bodily function.  Patient presents with complaints of chest pain as detailed above.  Asymptomatic at this time.  Differential includes ACS, GERD, pneumonia  Symptoms not appear consistent with ACS, EKG is reassuring, high sensitive troponin is normal.  Does have pneumonia on chest x-ray.  Suspicious for GERD as she agrees that symptoms are worse when she is lying down and after eating primarily.  Will trial Protonix, referral to cardiology for further evaluation as needed.  Return precautions discussed.        FINAL CLINICAL IMPRESSION(S) / ED DIAGNOSES   Final diagnoses:  Atypical chest pain     Rx / DC Orders   ED Discharge  Orders          Ordered    pantoprazole (PROTONIX) 40 MG tablet  Daily        01/20/22 1451    Ambulatory referral to Cardiology       Comments: If you have not heard from the Cardiology office within the next 72 hours please call [phone].   01/20/22 1452             Note:  This document was prepared using Dragon voice recognition software and may include unintentional dictation errors.   Lavonia Drafts, MD 01/20/22 2008

## 2022-01-20 NOTE — ED Triage Notes (Addendum)
Patient c/o mid-sternal chest pain for approximately 3 days. Patient denies nausea, but c/o "Stomach feels funny", left arm feels "a little numb" and is short of breath. Patient c/o feeling anxious, "like I'm about to have a panic attack."  Patient c/o shortness of breath intermittently and with exertion. Patient states she had a flu shot 2 days ago.

## 2022-01-24 ENCOUNTER — Encounter: Payer: Self-pay | Admitting: Family Medicine

## 2022-04-06 ENCOUNTER — Encounter: Payer: Self-pay | Admitting: Family Medicine

## 2022-04-06 MED ORDER — PANTOPRAZOLE SODIUM 40 MG PO TBEC: 40.0000 mg | DELAYED_RELEASE_TABLET | Freq: Every day | ORAL | 1 refills | Status: DC

## 2022-04-11 ENCOUNTER — Ambulatory Visit: Payer: BC Managed Care – PPO | Attending: Cardiology | Admitting: Cardiology

## 2022-04-11 ENCOUNTER — Encounter: Payer: Self-pay | Admitting: Cardiology

## 2022-04-11 VITALS — BP 132/90 | HR 62 | Ht 65.0 in | Wt 184.0 lb

## 2022-04-11 DIAGNOSIS — I340 Nonrheumatic mitral (valve) insufficiency: Secondary | ICD-10-CM

## 2022-04-11 NOTE — Progress Notes (Signed)
Cardiology Office Note:    Date:  04/11/2022   ID:  Peggy, Hicks June 02, 1965, MRN [id number]  PCP:  Abner Greenspan, MD  Cardiologist:  Kate Sable, MD  Electrophysiologist:  None   Referring MD: Abner Greenspan, MD   Chief Complaint  Patient presents with   Follow-up    Annual f/u, no new cardiac concerns    History of Present Illness:    Peggy Hicks is a 57 y.o. female with a hx of fibroids, mild mitral regurgitation who presents for follow-up.    Being seen for mitral regurgitation.  Serial echoes being performed.  Last echo 03/2021 showed mild MR, normal EF.  Feels well, has no concerns at this time.  About to exercise today.   Prior notes Echo 02/2021 EF 60 to 65%, mild MR Echo 02/26/2019 EF 55-60, moderate MR. Lexiscan Myoview 02/2019, no evidence for ischemia, low risk study  Past Medical History:  Diagnosis Date   Allergy    Arthritis    knee   Fibroids    GERD (gastroesophageal reflux disease)    Heart valve regurgitation 2020   Hx of adenomatous colonic polyps 04/14/2014   Hyperhidrosis    Stress reaction     Past Surgical History:  Procedure Laterality Date   BREAST BIOPSY     BREAST SURGERY     Breast Reduction   CESAREAN SECTION      X 2   COLONOSCOPY     HYSTERECTOMY ABDOMINAL WITH SALPINGECTOMY Bilateral 06/04/2018   Procedure: HYSTERECTOMY ABDOMINAL WITH SALPINGECTOMY;  Surgeon: Anastasio Auerbach, MD;  Location: [place];  Service: Gynecology;  Laterality: Bilateral;   HYSTEROSCOPY     MYOMECTOMY     Uterine fibroids removed/ hysteroscopic submucous myomectomy   POLYPECTOMY     TUBAL LIGATION      Current Medications: Current Meds  Medication Sig   ALPRAZolam (XANAX) 0.5 MG tablet Take 1 tablet (0.5 mg total) by mouth daily as needed for anxiety. For traveling   cetirizine (ZYRTEC) 10 MG tablet Take 10 mg by mouth daily as needed for allergies.   ELDERBERRY PO Take by mouth. Takes occassionally   famotidine  (PEPCID) 40 MG tablet TAKE 1 TABLET BY MOUTH EVERY DAY   fluticasone (FLONASE) 50 MCG/ACT nasal spray Place 1 spray into both nostrils daily as needed for allergies or rhinitis.   Lactobacillus (DIGESTIVE HEALTH PROBIOTIC) CAPS Take 1 capsule by mouth daily.   naproxen sodium (ALEVE) 220 MG tablet Take 440 mg by mouth daily as needed (pain).   NON FORMULARY Menopause vitamins daily   pantoprazole (PROTONIX) 40 MG tablet Take 1 tablet (40 mg total) by mouth daily.   Vitamin D, Cholecalciferol, 25 MCG (1000 UT) CAPS Take by mouth. Takes occasionally     Allergies:   Shrimp flavor   Social History   Socioeconomic History   Marital status: Married    Spouse name: Not on file   Number of children: Not on file   Years of education: Not on file   Highest education level: Not on file  Occupational History   Not on file  Tobacco Use   Smoking status: Never   Smokeless tobacco: Never  Vaping Use   Vaping Use: Never used  Substance and Sexual Activity   Alcohol use: Yes    Alcohol/week: 0.0 standard drinks of alcohol    Comment: Rare   Drug use: No   Sexual activity: Not Currently    Birth  control/protection: Surgical    Comment: Tubal lig-1st intercourse 57 yo--Fewer than 5 partners  Other Topics Concern   Not on file  Social History Narrative   Exercises regularly   Social Determinants of Health   Financial Resource Strain: Not on file  Food Insecurity: Not on file  Transportation Needs: Not on file  Physical Activity: Not on file  Stress: Not on file  Social Connections: Not on file     Family History: The patient's family history includes Cancer in her maternal aunt; Colon polyps in her mother; Diabetes in her mother; Heart disease in her father and mother; Hypertension in her father and mother. There is no history of Colon cancer, Esophageal cancer, Rectal cancer, or Stomach cancer.  ROS:   Please see the history of present illness.     All other systems reviewed and  are negative.  EKGs/Labs/Other Studies Reviewed:    The following studies were reviewed today:   EKG:  EKG is ordered today.  EKG shows normal sinus rhythm, possible LAE Recent Labs: 07/12/2021: TSH 3.520 01/20/2022: ALT 15; BUN 10; Creatinine, Ser 0.86; Hemoglobin 14.6; Platelets 289; Potassium 4.1; Sodium 135  Recent Lipid Panel    Component Value Date/Time   CHOL 235 (H) 07/12/2021 0741   TRIG 111 07/12/2021 0741   HDL 79 07/12/2021 0741   CHOLHDL 3.0 07/12/2021 0741   CHOLHDL 2.9 05/21/2012 1432   VLDL 27 05/21/2012 1432   LDLCALC 137 (H) 07/12/2021 0741    Physical Exam:    VS:  BP (!) 132/90 (BP Location: Left Arm)   Pulse 62   Ht '5\' 5"'$  (1.651 m)   Wt 184 lb (83.5 kg)   LMP 03/03/2018 (Approximate)   SpO2 98%   BMI 30.62 kg/m     Wt Readings from Last 3 Encounters:  04/11/22 184 lb (83.5 kg)  01/20/22 179 lb (81.2 kg)  11/14/21 190 lb 6 oz (86.4 kg)     GEN:  Well nourished, well developed in no acute distress HEENT: Normal NECK: No JVD; No carotid bruits CARDIAC: RRR, no murmurs, rubs, gallops RESPIRATORY:  Clear to auscultation without rales, wheezing or rhonchi  ABDOMEN: Soft, non-tender, non-distended MUSCULOSKELETAL:  No edema; No deformity  SKIN: Warm and dry NEUROLOGIC:  Alert and oriented x 3 PSYCHIATRIC:  Normal affect   ASSESSMENT:    1. Mitral valve insufficiency, unspecified etiology     PLAN:     mild to moderate MR, repeat echo 03/2021 shows mild MR, stable from prior.  EF 60 to 65%.  Continue monitoring with serial echoes, likely 3 years from prior.  02/2024   Follow-up in about 1-2years.  After repeat echo.    This note was generated in part or whole with voice recognition software. Voice recognition is usually quite accurate but there are transcription errors that can and very often do occur. I apologize for any typographical errors that were not detected and corrected.  Medication Adjustments/Labs and Tests Ordered: Current  medicines are reviewed at length with the patient today.  Concerns regarding medicines are outlined above.  Orders Placed This Encounter  Procedures   EKG 12-Lead   ECHOCARDIOGRAM COMPLETE   No orders of the defined types were placed in this encounter.   Patient Instructions  Medication Instructions:   Your physician recommends that you continue on your current medications as directed. Please refer to the Current Medication list given to you today.  *If you need a refill on your cardiac medications before your  next appointment, please call your pharmacy*   Lab Work:  None Ordered  If you have labs (blood work) drawn today and your tests are completely normal, you will receive your results only by: [place] (if you have MyChart) OR A paper copy in the mail If you have any lab test that is abnormal or we need to change your treatment, we will call you to review the results.   Testing/Procedures:  Echocardiogram - in November of 2025   Your physician has requested that you have an echocardiogram. Echocardiography is a painless test that uses sound waves to create images of your heart. It provides your doctor with information about the size and shape of your heart and how well your heart's chambers and valves are working. This procedure takes approximately one hour. There are no restrictions for this procedure. Please note; depending on visual quality an IV may need to be placed.     Follow-Up: At [facility], you and your health needs are our priority.  As part of our continuing mission to provide you with exceptional heart care, we have created designated Provider Care Teams.  These Care Teams include your primary Cardiologist (physician) and Advanced Practice Providers (APPs -  Physician Assistants and Nurse Practitioners) who all work together to provide you with the care you need, when you need it.  We recommend signing up for the patient portal called  "MyChart".  Sign up information is provided on this After Visit Summary.  MyChart is used to connect with patients for Virtual Visits (Telemedicine).  Patients are able to view lab/test results, encounter notes, upcoming appointments, etc.  Non-urgent messages can be sent to your provider as well.   To learn more about what you can do with MyChart, go to [email or web address].    Your next appointment:    After Echocardiogram  The format for your next appointment:   In Person  Provider:   You may see Kate Sable, MD or one of the following Advanced Practice Providers on your designated Care Team:   Murray Hodgkins, NP Christell Faith, PA-C Cadence Kathlen Mody, PA-C Gerrie Nordmann, NP      Signed, Kate Sable, MD  04/11/2022 9:59 AM    [place]

## 2022-04-11 NOTE — Patient Instructions (Signed)
Medication Instructions:   Your physician recommends that you continue on your current medications as directed. Please refer to the Current Medication list given to you today.  *If you need a refill on your cardiac medications before your next appointment, please call your pharmacy*   Lab Work:  None Ordered  If you have labs (blood work) drawn today and your tests are completely normal, you will receive your results only by: [place] (if you have MyChart) OR A paper copy in the mail If you have any lab test that is abnormal or we need to change your treatment, we will call you to review the results.   Testing/Procedures:  Echocardiogram - in November of 2025   Your physician has requested that you have an echocardiogram. Echocardiography is a painless test that uses sound waves to create images of your heart. It provides your doctor with information about the size and shape of your heart and how well your heart's chambers and valves are working. This procedure takes approximately one hour. There are no restrictions for this procedure. Please note; depending on visual quality an IV may need to be placed.     Follow-Up: At [facility], you and your health needs are our priority.  As part of our continuing mission to provide you with exceptional heart care, we have created designated Provider Care Teams.  These Care Teams include your primary Cardiologist (physician) and Advanced Practice Providers (APPs -  Physician Assistants and Nurse Practitioners) who all work together to provide you with the care you need, when you need it.  We recommend signing up for the patient portal called "MyChart".  Sign up information is provided on this After Visit Summary.  MyChart is used to connect with patients for Virtual Visits (Telemedicine).  Patients are able to view lab/test results, encounter notes, upcoming appointments, etc.  Non-urgent messages can be sent to your provider as  well.   To learn more about what you can do with MyChart, go to [email or web address].    Your next appointment:    After Echocardiogram  The format for your next appointment:   In Person  Provider:   You may see Kate Sable, MD or one of the following Advanced Practice Providers on your designated Care Team:   Murray Hodgkins, NP Christell Faith, PA-C Cadence Kathlen Mody, PA-C Gerrie Nordmann, NP

## 2022-06-28 ENCOUNTER — Encounter: Payer: Self-pay | Admitting: Nurse Practitioner

## 2022-06-29 ENCOUNTER — Ambulatory Visit (INDEPENDENT_AMBULATORY_CARE_PROVIDER_SITE_OTHER): Payer: BC Managed Care – PPO | Admitting: Nurse Practitioner

## 2022-06-29 ENCOUNTER — Encounter: Payer: Self-pay | Admitting: Nurse Practitioner

## 2022-06-29 VITALS — BP 144/98 | HR 71 | Ht 65.5 in | Wt 186.0 lb

## 2022-06-29 DIAGNOSIS — B9689 Other specified bacterial agents as the cause of diseases classified elsewhere: Secondary | ICD-10-CM

## 2022-06-29 DIAGNOSIS — N760 Acute vaginitis: Secondary | ICD-10-CM

## 2022-06-29 DIAGNOSIS — L292 Pruritus vulvae: Secondary | ICD-10-CM

## 2022-06-29 DIAGNOSIS — Z780 Asymptomatic menopausal state: Secondary | ICD-10-CM | POA: Diagnosis not present

## 2022-06-29 DIAGNOSIS — Z01419 Encounter for gynecological examination (general) (routine) without abnormal findings: Secondary | ICD-10-CM

## 2022-06-29 LAB — WET PREP FOR TRICH, YEAST, CLUE

## 2022-06-29 MED ORDER — METRONIDAZOLE 500 MG PO TABS: 500.0000 mg | ORAL_TABLET | Freq: Two times a day (BID) | ORAL | 0 refills | Status: DC

## 2022-06-29 NOTE — Progress Notes (Signed)
   Peggy Hicks [birth year] [id number]   History:  57 y.o. G2P2002 presents for annual exam. Postmenopausal - no HRT. Complains of external itching and small amount of white discharge. Thinks it is from vaginal dryness.  S/P 2020 TAH for fibroids with bilateral salpingectomies. Normal pap and mammogram history. GAD, prediabetes managed by PCP. HLD, mitral valve insufficiency managed by cardiology.  Gynecologic History Patient's last menstrual period was 03/03/2018 (approximate).   Contraception/Family planning: status post hysterectomy Sexually active: No  Health Maintenance Last Pap: 06/05/2019. Results were: Normal Last mammogram: 11/18/2021. Results were: Normal  Last colonoscopy: 06/12/2019. Results were: Adenoma, 7-year recall Last Dexa: Never  Past medical history, past surgical history, family history and social history were all reviewed and documented in the EPIC chart. Married. Recent contract ended. Looking for new job. Youngest son moving back home from [place], has Art gallery manager. Oldest living at home.  ROS:  A ROS was performed and pertinent positives and negatives are included.  Exam:  Vitals:   06/29/22 0843  BP: (!) 144/98  Pulse: 71  SpO2: 97%  Weight: 186 lb (84.4 kg)  Height: 5' 5.5" (1.664 m)     Body mass index is 30.48 kg/m.  General appearance:  Normal Thyroid:  Symmetrical, normal in size, without palpable masses or nodularity. Respiratory  Auscultation:  Clear without wheezing or rhonchi Cardiovascular  Auscultation:  Regular rate, without rubs, murmurs or gallops  Edema/varicosities:  Not grossly evident Abdominal  Soft,nontender, without masses, guarding or rebound.  Liver/spleen:  No organomegaly noted  Hernia:  None appreciated  Skin  Inspection:  Grossly normal   Breasts: Examined lying and sitting.   Right: Without masses, retractions, discharge or axillary adenopathy.   Left: Without masses, retractions, discharge or axillary  adenopathy. Genitourinary   Inguinal/mons:  Normal without inguinal adenopathy  External genitalia:  Normal appearing vulva with no masses, tenderness, or lesions  BUS/Urethra/Skene's glands:  Normal  Vagina:  White discharge present, no erythema  Cervix:  Absent  Uterus:  Absent  Adnexa/parametria:     Rt: Normal in size, without masses or tenderness.   Lt: Normal in size, without masses or tenderness.  Anus and perineum: Normal  Digital rectal exam: Deferred  Patient informed chaperone available to be present for breast and pelvic exam. Patient has requested no chaperone to be present. Patient has been advised what will be completed during breast and pelvic exam.   Wet prep + clue cells (+ odor)  Assessment/Plan:  57 y.o. [id number] for annual exam.   Well female exam with routine gynecological exam - Education provided on SBEs, importance of preventative screenings, current guidelines, high calcium diet, regular exercise, and multivitamin daily. Labs with PCP.   Postmenopausal - No HRT  Vulvar itching - Plan: WET PREP FOR TRICH, YEAST, CLUE. + clue cells.   Bacterial vaginosis - Plan: metroNIDAZOLE (FLAGYL) 500 MG tablet BID x 7 days.   Screening for cervical cancer - Normal Pap history.  No longer screening per guidelines.   Screening for breast cancer - Normal mammogram history.  Continue annual screenings.  Normal breast exam today.  Screening for colon cancer - 2021 colonoscopy. Will repeat at GI's recommended interval.   Screening for osteoporosis - Low/average risk. Will plan for DXA at age 57. Taking Vitamin D supplement and consumes high calcium diet.   Return in 1 year for annual.      Tamela Gammon DNP, 8:49 AM 06/29/2022

## 2022-07-20 ENCOUNTER — Telehealth: Payer: Self-pay | Admitting: Family Medicine

## 2022-07-20 DIAGNOSIS — R7303 Prediabetes: Secondary | ICD-10-CM

## 2022-07-20 DIAGNOSIS — Z0000 Encounter for general adult medical examination without abnormal findings: Secondary | ICD-10-CM

## 2022-07-20 DIAGNOSIS — E7800 Pure hypercholesterolemia, unspecified: Secondary | ICD-10-CM

## 2022-07-20 NOTE — Telephone Encounter (Signed)
-----   Message from Alvina Chou sent at 07/12/2022  2:14 PM EDT ----- Regarding: Lab orders for Friday, 4.19.24 Patient is scheduled for CPX labs, please order future labs, Thanks , Camelia Eng

## 2022-07-21 ENCOUNTER — Other Ambulatory Visit (INDEPENDENT_AMBULATORY_CARE_PROVIDER_SITE_OTHER): Payer: BC Managed Care – PPO

## 2022-07-21 DIAGNOSIS — E7800 Pure hypercholesterolemia, unspecified: Secondary | ICD-10-CM

## 2022-07-21 DIAGNOSIS — Z0000 Encounter for general adult medical examination without abnormal findings: Secondary | ICD-10-CM | POA: Diagnosis not present

## 2022-07-21 DIAGNOSIS — R7303 Prediabetes: Secondary | ICD-10-CM | POA: Diagnosis not present

## 2022-07-21 LAB — LIPID PANEL
Cholesterol: 240 mg/dL — ABNORMAL HIGH (ref 0–200)
HDL: 76 mg/dL (ref >39.00)
LDL Cholesterol: 146 mg/dL — ABNORMAL HIGH (ref 0–99)
NonHDL: 163.66
Total CHOL/HDL Ratio: 3
Triglycerides: 89 mg/dL (ref 0.0–149.0)
VLDL: 17.8 mg/dL (ref 0.0–40.0)

## 2022-07-21 LAB — COMPREHENSIVE METABOLIC PANEL
ALT: 12 U/L (ref 0–35)
AST: 17 U/L (ref 0–37)
Albumin: 4.3 g/dL (ref 3.5–5.2)
Alkaline Phosphatase: 77 U/L (ref 39–117)
BUN: 16 mg/dL (ref 6–23)
CO2: 29 mEq/L (ref 19–32)
Calcium: 9.7 mg/dL (ref 8.4–10.5)
Chloride: 105 mEq/L (ref 96–112)
Creatinine, Ser: 0.9 mg/dL (ref 0.40–1.20)
GFR: 70.41 mL/min (ref >60.00)
Glucose, Bld: 99 mg/dL (ref 70–99)
Potassium: 4.9 mEq/L (ref 3.5–5.1)
Sodium: 142 mEq/L (ref 135–145)
Total Bilirubin: 0.5 mg/dL (ref 0.2–1.2)
Total Protein: 6.7 g/dL (ref 6.0–8.3)

## 2022-07-21 LAB — CBC WITH DIFFERENTIAL/PLATELET
Basophils Absolute: 0 10*3/uL (ref 0.0–0.1)
Basophils Relative: 0.5 % (ref 0.0–3.0)
Eosinophils Absolute: 0.1 10*3/uL (ref 0.0–0.7)
Eosinophils Relative: 1.1 % (ref 0.0–5.0)
HCT: 42.4 % (ref 36.0–46.0)
Hemoglobin: 14.2 g/dL (ref 12.0–15.0)
Lymphocytes Relative: 48.8 % — ABNORMAL HIGH (ref 12.0–46.0)
Lymphs Abs: 3.2 10*3/uL (ref 0.7–4.0)
MCHC: 33.6 g/dL (ref 30.0–36.0)
MCV: 88.6 fl (ref 78.0–100.0)
Monocytes Absolute: 0.3 10*3/uL (ref 0.1–1.0)
Monocytes Relative: 4.8 % (ref 3.0–12.0)
Neutro Abs: 3 10*3/uL (ref 1.4–7.7)
Neutrophils Relative %: 44.8 % (ref 43.0–77.0)
Platelets: 283 10*3/uL (ref 150.0–400.0)
RBC: 4.78 Mil/uL (ref 3.87–5.11)
RDW: 13.7 % (ref 11.5–15.5)
WBC: 6.6 10*3/uL (ref 4.0–10.5)

## 2022-07-21 LAB — HEMOGLOBIN A1C: Hgb A1c MFr Bld: 6 % (ref 4.6–6.5)

## 2022-07-21 LAB — TSH: TSH: 2.83 u[IU]/mL (ref 0.35–5.50)

## 2022-07-28 ENCOUNTER — Encounter: Payer: Self-pay | Admitting: Family Medicine

## 2022-07-28 ENCOUNTER — Ambulatory Visit (INDEPENDENT_AMBULATORY_CARE_PROVIDER_SITE_OTHER): Payer: BC Managed Care – PPO | Admitting: Family Medicine

## 2022-07-28 VITALS — BP 128/78 | HR 70 | Temp 97.6°F | Ht 65.25 in | Wt 184.0 lb

## 2022-07-28 DIAGNOSIS — R7303 Prediabetes: Secondary | ICD-10-CM

## 2022-07-28 DIAGNOSIS — F411 Generalized anxiety disorder: Secondary | ICD-10-CM

## 2022-07-28 DIAGNOSIS — Z0000 Encounter for general adult medical examination without abnormal findings: Secondary | ICD-10-CM

## 2022-07-28 DIAGNOSIS — Z86010 Personal history of colonic polyps: Secondary | ICD-10-CM | POA: Diagnosis not present

## 2022-07-28 DIAGNOSIS — E669 Obesity, unspecified: Secondary | ICD-10-CM | POA: Diagnosis not present

## 2022-07-28 DIAGNOSIS — E7800 Pure hypercholesterolemia, unspecified: Secondary | ICD-10-CM

## 2022-07-28 NOTE — Progress Notes (Unsigned)
Subjective:    Patient ID: Peggy Hicks, female    DOB: 19-Oct-1965, 57 y.o.   MRN: [id number]  HPI Here for health maintenance exam and to review chronic medical problems   Wt Readings from Last 3 Encounters:  07/28/22 184 lb (83.5 kg)  06/29/22 186 lb (84.4 kg)  04/11/22 184 lb (83.5 kg)   30.39 kg/m  Vitals:   07/28/22 1104  BP: 128/78  Pulse: 70  Temp: 97.6 F (36.4 C)  SpO2: 99%   Struggling to try and loose weight  Wants to eat healthier  Fasting from 11 to 8 during the day   More salads   Exercise :walking  Injured with boot camp in the past -- plans to start back   Unemployed and caring for her mother  Was down and now doing better     Immunization History  Administered Date(s) Administered   DTaP 03/17/2017   Influenza Whole 01/02/2007   Influenza,inj,Quad PF,6+ Mos 01/17/2013, 02/02/2014, 04/25/2016, 03/05/2018, 12/18/2019   Influenza-Unspecified 01/11/2015, 03/04/2017, 12/04/2018, 02/01/2021   PFIZER(Purple Top)SARS-COV-2 Vaccination 06/18/2019, 07/09/2019, 02/12/2020   Pfizer Covid-19 Vaccine Bivalent Booster 43yrs & up 08/09/2021   Td 08/11/2005   Tdap 04/25/2016   Zoster Recombinat (Shingrix) 08/16/2021, 11/17/2021   There are no preventive care reminders to display for this patient.  Mammogram 11/2021 Self breast exam: no lumps   Had recent gyn visit with NP Wyline Beady  Had TAH in 2020 for fibroids  Her bp was up a bit that day    Colonoscopy 06/2019 7 year recall   Bone health - taking vitamin D   Mood-got frustrated  Better now  Working on mindfulness - some meditation  Is helping  H/o anxiety   Looking for employment    Prediabetes Lab Results  Component Value Date   HGBA1C 6.0 07/21/2022   This is stable from a year ago    Hyperlipidemia Lab Results  Component Value Date   CHOL 240 (H) 07/21/2022   CHOL 235 (H) 07/12/2021   CHOL 223 (H) 06/07/2021   Lab Results  Component Value Date   HDL 76.00  07/21/2022   HDL 79 07/12/2021   HDL 78 06/07/2021   Lab Results  Component Value Date   LDLCALC 146 (H) 07/21/2022   LDLCALC 137 (H) 07/12/2021   LDLCALC 125 (H) 06/07/2021   Lab Results  Component Value Date   TRIG 89.0 07/21/2022   TRIG 111 07/12/2021   TRIG 118 06/07/2021   Lab Results  Component Value Date   CHOLHDL 3 07/21/2022   CHOLHDL 3.0 07/12/2021   CHOLHDL 2.9 06/07/2021   No results found for: "LDLDIRECT"  No red meat  Recently stopped bacon   Mother has high cholesterol     The 10-year ASCVD risk score (Arnett DK, et al., 2019) is: 4%   Values used to calculate the score:     Age: 51 years     Sex: Female     Is Non-Hispanic African American: Yes     Diabetic: No     Tobacco smoker: No     Systolic Blood Pressure: 128 mmHg     Is BP treated: No     HDL Cholesterol: 76 mg/dL     Total Cholesterol: 240 mg/dL    Sees cardiology for mid to mod MR Echo was 03/2021 - nl EF    Last metabolic panel Lab Results  Component Value Date   GLUCOSE 99 07/21/2022   NA 142  07/21/2022   K 4.9 07/21/2022   CL 105 07/21/2022   CO2 29 07/21/2022   BUN 16 07/21/2022   CREATININE 0.90 07/21/2022   GFRNONAA >60 01/20/2022   CALCIUM 9.7 07/21/2022   PROT 6.7 07/21/2022   ALBUMIN 4.3 07/21/2022   LABGLOB 2.7 07/12/2021   AGRATIO 1.6 07/12/2021   BILITOT 0.5 07/21/2022   ALKPHOS 77 07/21/2022   AST 17 07/21/2022   ALT 12 07/21/2022   ANIONGAP 8 01/20/2022   Lab Results  Component Value Date   WBC 6.6 07/21/2022   HGB 14.2 07/21/2022   HCT 42.4 07/21/2022   MCV 88.6 07/21/2022   PLT 283.0 07/21/2022   Lab Results  Component Value Date   TSH 2.83 07/21/2022     Review of Systems     Objective:   Physical Exam        Assessment & Plan:

## 2022-07-28 NOTE — Patient Instructions (Addendum)
Try to get most of your carbohydrates from produce (with the exception of white potatoes)  Eat less bread/pasta/rice/snack foods/cereals/sweets and other items from the middle of the grocery store (processed carbs)  Continue exercising   Add some strength training to your routine, this is important for bone and brain health and can reduce your risk of falls and help your body use insulin properly and regulate weight  Light weights, exercise bands , and internet videos are a good way to start  Yoga (chair or regular), machines , floor exercises or a gym with machines are also good options    Boot camp is an excellent option also   For cholesterol Avoid red meat/ fried foods/ egg yolks/ fatty breakfast meats/ butter, cheese and high fat dairy/ and shellfish   Continue your vitamin D for bone health

## 2022-07-30 NOTE — Assessment & Plan Note (Addendum)
Pt is working on mindfulness with meditation  Good self care  This is helpful   Reviewed stressors/ coping techniques/symptoms/ support sources/ tx options and side effects in detail today  PHQ of 1 GAD of 1

## 2022-07-30 NOTE — Assessment & Plan Note (Signed)
Disc goals for lipids and reasons to control them Rev last labs with pt Rev low sat fat diet in detail LDL is up into the 140s / stopped red meat  Patient will work on diet and try to cut back bacon- this is her next step Has a family history  and may need statin in the future

## 2022-07-30 NOTE — Assessment & Plan Note (Signed)
Lab Results  Component Value Date   HGBA1C 6.0 07/21/2022    disc imp of low glycemic diet and wt loss to prevent DM2

## 2022-07-30 NOTE — Assessment & Plan Note (Signed)
Reviewed health habits including diet and exercise and skin cancer prevention Reviewed appropriate screening tests for age  Also reviewed health mt list, fam hx and immunization status , as well as social and family history   See HPI Labs reviewed and ordered Mammogram utd 11/2021 [facility]td gyn care s/p hysterectomy Colonoscopyu udt 06/2019 with 7 y recall  Taking vit D for bone health  Mood is improved Good self care

## 2022-07-30 NOTE — Assessment & Plan Note (Signed)
Colonoscopy 06/2019 with 7 y recall

## 2022-07-30 NOTE — Assessment & Plan Note (Signed)
Discussed how this problem influences overall health and the risks it imposes  Reviewed plan for weight loss with lower calorie diet (via better food choices and also portion control or program like weight watchers) and exercise building up to or more than 30 minutes 5 days per week including some aerobic activity  She is trying some intermittent fasting and more salads

## 2022-08-04 DIAGNOSIS — H52223 Regular astigmatism, bilateral: Secondary | ICD-10-CM | POA: Diagnosis not present

## 2022-08-04 DIAGNOSIS — H33303 Unspecified retinal break, bilateral: Secondary | ICD-10-CM | POA: Diagnosis not present

## 2022-08-04 DIAGNOSIS — H0520 Unspecified exophthalmos: Secondary | ICD-10-CM | POA: Diagnosis not present

## 2022-08-04 DIAGNOSIS — H5213 Myopia, bilateral: Secondary | ICD-10-CM | POA: Diagnosis not present

## 2022-08-21 ENCOUNTER — Encounter: Payer: Self-pay | Admitting: Nurse Practitioner

## 2022-08-22 NOTE — Telephone Encounter (Signed)
Pt scheduled on 08/30/2022. Will close encounter.

## 2022-08-30 ENCOUNTER — Ambulatory Visit: Payer: BC Managed Care – PPO | Admitting: Nurse Practitioner

## 2022-11-06 ENCOUNTER — Other Ambulatory Visit: Payer: Self-pay | Admitting: Family Medicine

## 2022-12-01 DIAGNOSIS — Z1231 Encounter for screening mammogram for malignant neoplasm of breast: Secondary | ICD-10-CM | POA: Diagnosis not present

## 2022-12-01 LAB — HM MAMMOGRAPHY

## 2022-12-05 ENCOUNTER — Encounter: Payer: Self-pay | Admitting: Family Medicine

## 2023-06-01 ENCOUNTER — Telehealth: Payer: Self-pay

## 2023-06-01 DIAGNOSIS — E7800 Pure hypercholesterolemia, unspecified: Secondary | ICD-10-CM

## 2023-06-01 DIAGNOSIS — R7303 Prediabetes: Secondary | ICD-10-CM

## 2023-06-01 DIAGNOSIS — Z0000 Encounter for general adult medical examination without abnormal findings: Secondary | ICD-10-CM

## 2023-06-01 NOTE — Telephone Encounter (Signed)
 Patient has been scheduled

## 2023-06-01 NOTE — Telephone Encounter (Signed)
 LVM for patient to c/b and schedule.

## 2023-06-01 NOTE — Telephone Encounter (Signed)
 Pt was scheduled Monday for labs prior to CPE. Please place orders

## 2023-06-01 NOTE — Telephone Encounter (Signed)
 Please schedule fasting lab appt for Monday, since pt's getting CPE Wednesday

## 2023-06-01 NOTE — Telephone Encounter (Signed)
 Copied from CRM [id number]. Topic: Clinical - Request for Lab/Test Order >> Jun 01, 2023  8:18 AM Rodman Pickle T wrote: Reason for CRM: patent is calling in regards to her labs for her appt Monday she would like labs ordered

## 2023-06-03 ENCOUNTER — Telehealth: Payer: Self-pay | Admitting: Family Medicine

## 2023-06-03 NOTE — Addendum Note (Signed)
 Addended by: Roxy Manns A on: 06/03/2023 01:11 PM   Modules accepted: Orders

## 2023-06-03 NOTE — Telephone Encounter (Signed)
-----   Message from Alvina Chou sent at 06/01/2023  3:17 PM EST ----- Regarding: Lab orders for Mon, 3.3.25 Patient is scheduled for CPX labs, please order future labs, Thanks , Camelia Eng

## 2023-06-04 ENCOUNTER — Other Ambulatory Visit (INDEPENDENT_AMBULATORY_CARE_PROVIDER_SITE_OTHER): Payer: BC Managed Care – PPO

## 2023-06-04 DIAGNOSIS — E7800 Pure hypercholesterolemia, unspecified: Secondary | ICD-10-CM

## 2023-06-04 DIAGNOSIS — Z0000 Encounter for general adult medical examination without abnormal findings: Secondary | ICD-10-CM

## 2023-06-04 DIAGNOSIS — R7303 Prediabetes: Secondary | ICD-10-CM

## 2023-06-04 LAB — CBC WITH DIFFERENTIAL/PLATELET
Basophils Absolute: 0 10*3/uL (ref 0.0–0.1)
Basophils Relative: 0.2 % (ref 0.0–3.0)
Eosinophils Absolute: 0.1 10*3/uL (ref 0.0–0.7)
Eosinophils Relative: 1 % (ref 0.0–5.0)
HCT: 44.5 % (ref 36.0–46.0)
Hemoglobin: 14.5 g/dL (ref 12.0–15.0)
Lymphocytes Relative: 43.7 % (ref 12.0–46.0)
Lymphs Abs: 3.3 10*3/uL (ref 0.7–4.0)
MCHC: 32.7 g/dL (ref 30.0–36.0)
MCV: 89.3 fl (ref 78.0–100.0)
Monocytes Absolute: 0.4 10*3/uL (ref 0.1–1.0)
Monocytes Relative: 4.9 % (ref 3.0–12.0)
Neutro Abs: 3.8 10*3/uL (ref 1.4–7.7)
Neutrophils Relative %: 50.2 % (ref 43.0–77.0)
Platelets: 305 10*3/uL (ref 150.0–400.0)
RBC: 4.98 Mil/uL (ref 3.87–5.11)
RDW: 13.8 % (ref 11.5–15.5)
WBC: 7.5 10*3/uL (ref 4.0–10.5)

## 2023-06-04 LAB — COMPREHENSIVE METABOLIC PANEL
ALT: 11 U/L (ref 0–35)
AST: 16 U/L (ref 0–37)
Albumin: 4.4 g/dL (ref 3.5–5.2)
Alkaline Phosphatase: 80 U/L (ref 39–117)
BUN: 13 mg/dL (ref 6–23)
CO2: 29 meq/L (ref 19–32)
Calcium: 9.9 mg/dL (ref 8.4–10.5)
Chloride: 103 meq/L (ref 96–112)
Creatinine, Ser: 0.82 mg/dL (ref 0.40–1.20)
GFR: 78.25 mL/min (ref >60.00)
Glucose, Bld: 99 mg/dL (ref 70–99)
Potassium: 5.1 meq/L (ref 3.5–5.1)
Sodium: 140 meq/L (ref 135–145)
Total Bilirubin: 0.7 mg/dL (ref 0.2–1.2)
Total Protein: 7 g/dL (ref 6.0–8.3)

## 2023-06-04 LAB — LIPID PANEL
Cholesterol: 247 mg/dL — ABNORMAL HIGH (ref 0–200)
HDL: 84.7 mg/dL (ref >39.00)
LDL Cholesterol: 138 mg/dL — ABNORMAL HIGH (ref 0–99)
NonHDL: 161.81
Total CHOL/HDL Ratio: 3
Triglycerides: 118 mg/dL (ref 0.0–149.0)
VLDL: 23.6 mg/dL (ref 0.0–40.0)

## 2023-06-04 LAB — TSH: TSH: 1.65 u[IU]/mL (ref 0.35–5.50)

## 2023-06-04 LAB — HEMOGLOBIN A1C: Hgb A1c MFr Bld: 6.1 % (ref 4.6–6.5)

## 2023-06-06 ENCOUNTER — Encounter: Payer: BC Managed Care – PPO | Admitting: Family Medicine

## 2023-06-06 NOTE — Progress Notes (Deleted)
 Subjective:    Patient ID: Peggy Hicks, female    DOB: Aug 31, 1965, 58 y.o.   MRN: [id number]  HPI  Here for health maintenance exam and to review chronic medical problems   Wt Readings from Last 3 Encounters:  07/28/22 184 lb (83.5 kg)  06/29/22 186 lb (84.4 kg)  04/11/22 184 lb (83.5 kg)      There were no vitals filed for this visit.  Immunization History  Administered Date(s) Administered   DTaP 03/17/2017   Influenza Whole 01/02/2007   Influenza,inj,Quad PF,6+ Mos 01/17/2013, 02/02/2014, 04/25/2016, 03/05/2018, 12/18/2019   Influenza-Unspecified 01/11/2015, 03/04/2017, 12/04/2018, 02/01/2021   PFIZER(Purple Top)SARS-COV-2 Vaccination 06/18/2019, 07/09/2019, 02/12/2020   Pfizer Covid-19 Vaccine Bivalent Booster 34yrs & up 08/09/2021   Td 08/11/2005   Tdap 04/25/2016   Zoster Recombinant(Shingrix) 08/16/2021, 11/17/2021    Health Maintenance Due  Topic Date Due   Cervical Cancer Screening (HPV/Pap Cotest)  06/05/2022   INFLUENZA VACCINE  11/02/2022   COVID-19 Vaccine (5 - 2024-25 season) 12/03/2022     Mammogram Self breast exam  Gyn health   Prostate health No results found for: "PSA1", "PSA"    Colon cancer screening  Colonoscopy 06/2019 with 7 year recall    Bone health  Dexa  Falls Fractures Supplements  Last vitamin D No results found for: "25OHVITD2", "25OHVITD3", "VD25OH"  Exercise    Mood    07/28/2022   11:10 AM 03/08/2021    3:38 PM 07/12/2020    8:30 AM 06/18/2019    9:45 AM 05/07/2018    9:09 AM  Depression screen PHQ 2/9  Decreased Interest 0 0 0 0 0  Down, Depressed, Hopeless 0 1 0 0 0  PHQ - 2 Score 0 1 0 0 0  Altered sleeping 1 3 1 1  0  Tired, decreased energy  1 1 0 0  Change in appetite 0 0 1 0 0  Feeling bad or failure about yourself  0 0 0 0 0  Trouble concentrating 0 0 0 0 0  Moving slowly or fidgety/restless 0 0 0 0 0  Suicidal thoughts 0 0 0 0 0  PHQ-9 Score 1 5 3 1  0  Difficult doing work/chores Not difficult  at all  Not difficult at all Not difficult at all Not difficult at all   GAD  Hyperlipidemia Lab Results  Component Value Date   CHOL 247 (H) 06/04/2023   CHOL 240 (H) 07/21/2022   CHOL 235 (H) 07/12/2021   Lab Results  Component Value Date   HDL 84.70 06/04/2023   HDL 76.00 07/21/2022   HDL 79 07/12/2021   Lab Results  Component Value Date   LDLCALC 138 (H) 06/04/2023   LDLCALC 146 (H) 07/21/2022   LDLCALC 137 (H) 07/12/2021   Lab Results  Component Value Date   TRIG 118.0 06/04/2023   TRIG 89.0 07/21/2022   TRIG 111 07/12/2021   Lab Results  Component Value Date   CHOLHDL 3 06/04/2023   CHOLHDL 3 07/21/2022   CHOLHDL 3.0 07/12/2021   No results found for: "LDLDIRECT"  Diet controlled  Family history   Prediabetes Lab Results  Component Value Date   HGBA1C 6.1 06/04/2023   HGBA1C 6.0 07/21/2022   HGBA1C 6.0 (H) 07/12/2021     Patient Active Problem List   Diagnosis Date Noted   Piriformis muscle pain 05/18/2021   Hyperlipidemia 07/12/2020   Obesity (BMI 30-39.9) 01/20/2020   Hx of adenomatous colonic polyps 04/14/2014   Routine general medical  examination at a health care facility 01/01/2012   Stress reaction 11/13/2011   Prediabetes 11/15/2010   PRIMARY FOCAL HYPERHIDROSIS 10/22/2007   Generalized anxiety disorder 08/01/2006   Past Medical History:  Diagnosis Date   Allergy    Arthritis    knee   Fibroids    GERD (gastroesophageal reflux disease)    Heart valve regurgitation 2020   Hx of adenomatous colonic polyps 04/14/2014   Hyperhidrosis    Stress reaction    Past Surgical History:  Procedure Laterality Date   BREAST BIOPSY     BREAST SURGERY     Breast Reduction   CESAREAN SECTION      X 2   COLONOSCOPY     HYSTERECTOMY ABDOMINAL WITH SALPINGECTOMY Bilateral 06/04/2018   Procedure: HYSTERECTOMY ABDOMINAL WITH SALPINGECTOMY;  Surgeon: Dara Lords, MD;  Location: [facility];  Service: Gynecology;  Laterality:  Bilateral;   HYSTEROSCOPY     MYOMECTOMY     Uterine fibroids removed/ hysteroscopic submucous myomectomy   POLYPECTOMY     TUBAL LIGATION     Social History   Tobacco Use   Smoking status: Never   Smokeless tobacco: Never  Vaping Use   Vaping status: Never Used  Substance Use Topics   Alcohol use: Yes    Alcohol/week: 0.0 standard drinks of alcohol    Comment: Rare   Drug use: No   Family History  Problem Relation Age of Onset   Hypertension Mother    Heart disease Mother    Diabetes Mother    Colon polyps Mother    Hypertension Father    Heart disease Father    Cancer Maternal Aunt        liver   Colon cancer Neg Hx    Esophageal cancer Neg Hx    Rectal cancer Neg Hx    Stomach cancer Neg Hx    Allergies  Allergen Reactions   Shrimp Flavor Agent (Non-Screening)     Feels like throat closes up when eat shrimp; 06/29/2022: reports having some recently and was fine.    Current Outpatient Medications on File Prior to Visit  Medication Sig Dispense Refill   ALPRAZolam (XANAX) 0.5 MG tablet Take 1 tablet (0.5 mg total) by mouth daily as needed for anxiety. For traveling 30 tablet 0   cetirizine (ZYRTEC) 10 MG tablet Take 10 mg by mouth daily as needed for allergies.     ELDERBERRY PO Take by mouth. Takes occassionally     famotidine (PEPCID) 40 MG tablet TAKE 1 TABLET BY MOUTH EVERY DAY 90 tablet 1   fluticasone (FLONASE) 50 MCG/ACT nasal spray Place 1 spray into both nostrils daily as needed for allergies or rhinitis.     Lactobacillus (DIGESTIVE HEALTH PROBIOTIC) CAPS Take 1 capsule by mouth daily.     naproxen sodium (ALEVE) 220 MG tablet Take 440 mg by mouth daily as needed (pain).     NON FORMULARY Menopause vitamins daily     Vitamin D, Cholecalciferol, 25 MCG (1000 UT) CAPS Take by mouth. Takes occasionally     No current facility-administered medications on file prior to visit.    Review of Systems     Objective:   Physical Exam        Assessment &  Plan:   Problem List Items Addressed This Visit   None

## 2023-07-02 ENCOUNTER — Ambulatory Visit: Payer: BC Managed Care – PPO | Admitting: Nurse Practitioner

## 2023-07-02 NOTE — Progress Notes (Deleted)
   Peggy Hicks 01/01/1966 [id number]   History:  58 y.o. G2P2002 presents for annual exam. Postmenopausal - no HRT. S/P 2020 TAH for fibroids with bilateral salpingectomies. Normal pap and mammogram history. GAD, prediabetes managed by PCP. HLD, mitral valve insufficiency managed by cardiology.  Gynecologic History Patient's last menstrual period was 03/03/2018 (approximate).   Contraception/Family planning: status post hysterectomy Sexually active: No  Health Maintenance Last Pap: 06/05/2019. Results were: Normal Last mammogram: 12/01/2022. Results were: Normal  Last colonoscopy: 06/12/2019. Results were: Adenoma, 7-year recall Last Dexa: Never  Past medical history, past surgical history, family history and social history were all reviewed and documented in the EPIC chart. Married. Recent contract ended. Looking for new job. Youngest son moving back home from [place], has Medical laboratory scientific officer. Oldest living at home.  ROS:  A ROS was performed and pertinent positives and negatives are included.  Exam:  There were no vitals filed for this visit.    There is no height or weight on file to calculate BMI.  General appearance:  Normal Thyroid:  Symmetrical, normal in size, without palpable masses or nodularity. Respiratory  Auscultation:  Clear without wheezing or rhonchi Cardiovascular  Auscultation:  Regular rate, without rubs, murmurs or gallops  Edema/varicosities:  Not grossly evident Abdominal  Soft,nontender, without masses, guarding or rebound.  Liver/spleen:  No organomegaly noted  Hernia:  None appreciated  Skin  Inspection:  Grossly normal   Breasts: Examined lying and sitting.   Right: Without masses, retractions, discharge or axillary adenopathy.   Left: Without masses, retractions, discharge or axillary adenopathy. Pelvic: External genitalia:  no lesions              Urethra:  normal appearing urethra with no masses, tenderness or lesions              Bartholins  and Skenes: normal                 Vagina: normal appearing vagina with normal color and discharge, no lesions              Cervix: absent Bimanual Exam:  Uterus:  absent              Adnexa: no mass, fullness, tenderness              Rectovaginal: Deferred              Anus:  normal, no lesions  Patient informed chaperone available to be present for breast and pelvic exam. Patient has requested no chaperone to be present. Patient has been advised what will be completed during breast and pelvic exam.   Assessment/Plan:  58 y.o. G2P2002 for annual exam.   Well female exam with routine gynecological exam - Education provided on SBEs, importance of preventative screenings, current guidelines, high calcium diet, regular exercise, and multivitamin daily. Labs with PCP.   Postmenopausal - No HRT  Screening for cervical cancer - Normal Pap history.  No longer screening per guidelines.   Screening for breast cancer - Normal mammogram history.  Continue annual screenings.  Normal breast exam today.  Screening for colon cancer - 2021 colonoscopy. Will repeat at GI's recommended interval.   Screening for osteoporosis - Will plan for DXA at age 58. Taking Vitamin D supplement and consumes high calcium diet.   No follow-ups on file.     [name] DNP, 7:43 AM 07/02/2023

## 2023-07-03 ENCOUNTER — Encounter: Payer: Self-pay | Admitting: Nurse Practitioner

## 2023-07-03 ENCOUNTER — Ambulatory Visit (INDEPENDENT_AMBULATORY_CARE_PROVIDER_SITE_OTHER): Admitting: Nurse Practitioner

## 2023-07-03 VITALS — BP 122/78 | HR 49 | Ht 65.0 in | Wt 185.0 lb

## 2023-07-03 DIAGNOSIS — Z01419 Encounter for gynecological examination (general) (routine) without abnormal findings: Secondary | ICD-10-CM | POA: Diagnosis not present

## 2023-07-03 DIAGNOSIS — B9689 Other specified bacterial agents as the cause of diseases classified elsewhere: Secondary | ICD-10-CM

## 2023-07-03 DIAGNOSIS — N898 Other specified noninflammatory disorders of vagina: Secondary | ICD-10-CM | POA: Diagnosis not present

## 2023-07-03 DIAGNOSIS — Z780 Asymptomatic menopausal state: Secondary | ICD-10-CM

## 2023-07-03 DIAGNOSIS — N760 Acute vaginitis: Secondary | ICD-10-CM | POA: Diagnosis not present

## 2023-07-03 LAB — WET PREP FOR TRICH, YEAST, CLUE

## 2023-07-03 MED ORDER — METRONIDAZOLE 500 MG PO TABS: 500.0000 mg | ORAL_TABLET | Freq: Two times a day (BID) | ORAL | 0 refills | Status: DC

## 2023-07-03 NOTE — Progress Notes (Signed)
 Peggy Hicks 1965/11/16 [id number]   History:  58 y.o. G2P2002 presents for annual exam. Changed her soap a few weeks ago and had some yeast-like symptoms. Treated with OTC and feels symptoms are somewhat better. Postmenopausal - no HRT. S/P 2020 TAH for fibroids with bilateral salpingectomies. Normal pap and mammogram history. GAD, prediabetes managed by PCP. HLD, mitral valve insufficiency managed by cardiology.  Gynecologic History Patient's last menstrual period was 03/03/2018 (approximate).   Contraception/Family planning: status post hysterectomy Sexually active: No  Health Maintenance Last Pap: 06/05/2019. Results were: Normal Last mammogram: 12/01/2022. Results were: Normal  Last colonoscopy: 06/12/2019. Results were: Adenoma, 7-year recall Last Dexa: Never  Past medical history, past surgical history, family history and social history were all reviewed and documented in the EPIC chart. 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 Oldest son and mother living with her.   ROS:  A ROS was performed and pertinent positives and negatives are included.  Exam:  Vitals:   07/03/23 0856  BP: 122/78  Pulse: (!) 49  SpO2: 98%  Weight: 185 lb (83.9 kg)  Height: 5\' 5"  (1.651 m)      Body mass index is 30.79 kg/m.  General appearance:  Normal Thyroid:  Symmetrical, normal in size, without palpable masses or nodularity. Respiratory  Auscultation:  Clear without wheezing or rhonchi Cardiovascular  Auscultation:  Regular rate, without rubs, murmurs or gallops  Edema/varicosities:  Not grossly evident Abdominal  Soft,nontender, without masses, guarding or rebound.  Liver/spleen:  No organomegaly noted  Hernia:  None appreciated  Skin  Inspection:  Grossly normal   Breasts: Examined lying and sitting.   Right: Without masses, retractions, discharge or axillary adenopathy.   Left: Without masses, retractions, discharge or axillary adenopathy. Pelvic:  External genitalia:  no lesions              Urethra:  normal appearing urethra with no masses, tenderness or lesions              Bartholins and Skenes: normal                 Vagina: normal appearing vagina with normal color and discharge, no lesions              Cervix: absent Bimanual Exam:  Uterus:  absent              Adnexa: no mass, fullness, tenderness              Rectovaginal: Deferred              Anus:  normal, no lesions  Patient informed chaperone available to be present for breast and pelvic exam. Patient has requested no chaperone to be present. Patient has been advised what will be completed during breast and pelvic exam.   Wet prep + clue cells (+ odor)  Assessment/Plan:  58 y.o. [id number] for annual exam.   Well female exam with routine gynecological exam - Education provided on SBEs, importance of preventative screenings, current guidelines, high calcium diet, regular exercise, and multivitamin daily. Labs with PCP.   Postmenopausal - No HRT  Vaginal itching - Plan: WET PREP FOR TRICH, YEAST, CLUE. + clue cells  Bacterial vaginosis - Plan: metroNIDAZOLE (FLAGYL) 500 MG tablet BID x 7 days.   Screening for cervical cancer - Normal Pap history.  No longer screening per guidelines.   Screening for breast cancer - Normal mammogram history.  Continue annual screenings.  Normal  breast exam today.  Screening for colon cancer - 2021 colonoscopy. Will repeat at GI's recommended interval.   Screening for osteoporosis - Will plan for DXA at age 58. Taking Vitamin D supplement and consumes high calcium diet.   Return in about 1 year (around 07/02/2024) for Annual.     [name] DNP, 9:09 AM 07/03/2023

## 2023-07-30 ENCOUNTER — Ambulatory Visit (INDEPENDENT_AMBULATORY_CARE_PROVIDER_SITE_OTHER): Admitting: Family Medicine

## 2023-07-30 VITALS — BP 121/80 | HR 60 | Temp 98.2°F | Ht 65.0 in | Wt 183.4 lb

## 2023-07-30 DIAGNOSIS — E7800 Pure hypercholesterolemia, unspecified: Secondary | ICD-10-CM | POA: Diagnosis not present

## 2023-07-30 DIAGNOSIS — Z860101 Personal history of adenomatous and serrated colon polyps: Secondary | ICD-10-CM

## 2023-07-30 DIAGNOSIS — Z0000 Encounter for general adult medical examination without abnormal findings: Secondary | ICD-10-CM

## 2023-07-30 DIAGNOSIS — K219 Gastro-esophageal reflux disease without esophagitis: Secondary | ICD-10-CM | POA: Insufficient documentation

## 2023-07-30 DIAGNOSIS — F411 Generalized anxiety disorder: Secondary | ICD-10-CM

## 2023-07-30 DIAGNOSIS — R7303 Prediabetes: Secondary | ICD-10-CM | POA: Diagnosis not present

## 2023-07-30 DIAGNOSIS — E669 Obesity, unspecified: Secondary | ICD-10-CM

## 2023-07-30 NOTE — Patient Instructions (Addendum)
  Avoid red meat/ fried foods/ egg yolks/ fatty breakfast meats/ butter, cheese and high fat dairy/ and shellfish   The coronary calcium scan costs about 200$ out of pocket if you are interested later   To prevent diabetes  Try to get most of your carbohydrates from produce (with the exception of white potatoes) and whole grains Eat less bread/pasta/rice/snack foods/cereals/sweets and other items from the middle of the grocery store (processed carbs)

## 2023-07-30 NOTE — Progress Notes (Signed)
 Subjective:    Patient ID: Peggy Hicks, female    DOB: Feb 24, 1966, 58 y.o.   MRN: [id number]  HPI  Here for health maintenance exam and to review chronic medical problems   Wt Readings from Last 3 Encounters:  07/30/23 183 lb 6 oz (83.2 kg)  07/03/23 185 lb (83.9 kg)  07/28/22 184 lb (83.5 kg)   30.52 kg/m  Vitals:   07/30/23 0853 07/30/23 0915  BP: 134/86 121/80  Pulse: 60   Temp: 98.2 F (36.8 C)   SpO2: 99%     Immunization History  Administered Date(s) Administered   DTaP 03/17/2017   Influenza Whole 01/02/2007   Influenza,inj,Quad PF,6+ Mos 01/17/2013, 02/02/2014, 04/25/2016, 03/05/2018, 12/18/2019   Influenza-Unspecified 01/11/2015, 03/04/2017, 12/04/2018, 02/01/2021   PFIZER(Purple Top)SARS-COV-2 Vaccination 06/18/2019, 07/09/2019, 02/12/2020   Pfizer Covid-19 Vaccine Bivalent Booster 7yrs & up 08/09/2021   Td 08/11/2005   Tdap 04/25/2016   Zoster Recombinant(Shingrix ) 08/16/2021, 11/17/2021    There are no preventive care reminders to display for this patient.  Still looking for a job - over 2 years  Worked in [place] for 26 years   Mammogram 11/2022  Self breast exam- no lumps   Gyn health Has had hysterectomy  Had well woman exam with gyn early this month= was treated for BV with flagyl      Colon cancer screening colonoscopy 06/2019 - 7 year recall / polyps   Bone health   Falls- none  Fractures-none  Supplements -vitamin D    Exercise : Goes to boot camp class-starting to love it  Lifts the weights she can      Mood    07/30/2023    8:59 AM 07/03/2023    8:56 AM 07/28/2022   11:10 AM 03/08/2021    3:38 PM 07/12/2020    8:30 AM  Depression screen PHQ 2/9  Decreased Interest 0 0 0 0 0  Down, Depressed, Hopeless 0 1 0 1 0  PHQ - 2 Score 0 1 0 1 0  Altered sleeping 0  1 3 1   Tired, decreased energy 0   1 1  Change in appetite 0  0 0 1  Feeling bad or failure about yourself  0  0 0 0  Trouble concentrating 0  0 0 0  Moving slowly or  fidgety/restless 0  0 0 0  Suicidal thoughts 0  0 0 0  PHQ-9 Score 0  1 5 3   Difficult doing work/chores Not difficult at all  Not difficult at all  Not difficult at all   History of GAD      07/30/2023    8:59 AM 07/28/2022   11:10 AM  GAD 7 : Generalized Anxiety Score  Nervous, Anxious, on Edge 0 0  Control/stop worrying 0 0  Worry too much - different things 0 1  Trouble relaxing 0 0  Restless 0 0  Easily annoyed or irritable 0 0  Afraid - awful might happen 0 0  Total GAD 7 Score 0 1  Anxiety Difficulty Not difficult at all Not difficult at all        Hyperlipidemia Lab Results  Component Value Date   CHOL 247 (H) 06/04/2023   CHOL 240 (H) 07/21/2022   CHOL 235 (H) 07/12/2021   Lab Results  Component Value Date   HDL 84.70 06/04/2023   HDL 76.00 07/21/2022   HDL 79 07/12/2021   Lab Results  Component Value Date   LDLCALC 138 (H) 06/04/2023   LDLCALC  146 (H) 07/21/2022   LDLCALC 137 (H) 07/12/2021   Lab Results  Component Value Date   TRIG 118.0 06/04/2023   TRIG 89.0 07/21/2022   TRIG 111 07/12/2021   Lab Results  Component Value Date   CHOLHDL 3 06/04/2023   CHOLHDL 3 07/21/2022   CHOLHDL 3.0 07/12/2021   No results found for: "LDLDIRECT"  Working on diet  Mother had heart problems in mid 66s      Prediabetes Lab Results  Component Value Date   HGBA1C 6.1 06/04/2023   HGBA1C 6.0 07/21/2022   HGBA1C 6.0 (H) 07/12/2021   Eating better Eating smaller portions    GERD Takes pepcid   Lab Results  Component Value Date   NA 140 06/04/2023   K 5.1 06/04/2023   CO2 29 06/04/2023   GLUCOSE 99 06/04/2023   BUN 13 06/04/2023   CREATININE 0.82 06/04/2023   CALCIUM 9.9 06/04/2023   GFR 78.25 06/04/2023   EGFR 75 07/12/2021   GFRNONAA >60 01/20/2022   Lab Results  Component Value Date   ALT 11 06/04/2023   AST 16 06/04/2023   ALKPHOS 80 06/04/2023   BILITOT 0.7 06/04/2023   Lab Results  Component Value Date   WBC 7.5 06/04/2023    HGB 14.5 06/04/2023   HCT 44.5 06/04/2023   MCV 89.3 06/04/2023   PLT 305.0 06/04/2023   Lab Results  Component Value Date   TSH 1.65 06/04/2023       Patient Active Problem List   Diagnosis Date Noted   GERD (gastroesophageal reflux disease) 07/30/2023   Piriformis muscle pain 05/18/2021   Hyperlipidemia 07/12/2020   Obesity (BMI 30-39.9) 01/20/2020   Hx of adenomatous colonic polyps 04/14/2014   Routine general medical examination at a health care facility 01/01/2012   Prediabetes 11/15/2010   PRIMARY FOCAL HYPERHIDROSIS 10/22/2007   Generalized anxiety disorder 08/01/2006   Past Medical History:  Diagnosis Date   Allergy    Arthritis    knee   Fibroids    GERD (gastroesophageal reflux disease)    Heart valve regurgitation 2020   Hx of adenomatous colonic polyps 04/14/2014   Hyperhidrosis    Stress reaction    Past Surgical History:  Procedure Laterality Date   BREAST BIOPSY     BREAST SURGERY     Breast Reduction   CESAREAN SECTION      X 2   COLONOSCOPY     HYSTERECTOMY ABDOMINAL WITH SALPINGECTOMY Bilateral 06/04/2018   Procedure: HYSTERECTOMY ABDOMINAL WITH SALPINGECTOMY;  Surgeon: Lacretia Piccolo, MD;  Location: [facility];  Service: Gynecology;  Laterality: Bilateral;   HYSTEROSCOPY     MYOMECTOMY     Uterine fibroids removed/ hysteroscopic submucous myomectomy   POLYPECTOMY     TUBAL LIGATION     Social History   Tobacco Use   Smoking status: Never   Smokeless tobacco: Never  Vaping Use   Vaping status: Never Used  Substance Use Topics   Alcohol use: Yes    Comment: Rare   Drug use: No   Family History  Problem Relation Age of Onset   Hypertension Mother    Heart disease Mother    Diabetes Mother    Colon polyps Mother    Hypertension Father    Heart disease Father    Cancer Maternal Aunt        liver   Colon cancer Neg Hx    Esophageal cancer Neg Hx    Rectal cancer Neg Hx  Stomach cancer Neg Hx     Allergies  Allergen Reactions   Shrimp Flavor Agent (Non-Screening)     Feels like throat closes up when eat shrimp; 06/29/2022: reports having some recently and was fine.    Current Outpatient Medications on File Prior to Visit  Medication Sig Dispense Refill   ALPRAZolam  (XANAX ) 0.5 MG tablet Take 1 tablet (0.5 mg total) by mouth daily as needed for anxiety. For traveling 30 tablet 0   cetirizine (ZYRTEC) 10 MG tablet Take 10 mg by mouth daily as needed for allergies.     ELDERBERRY PO Take by mouth. Takes occassionally     famotidine  (PEPCID ) 40 MG tablet TAKE 1 TABLET BY MOUTH EVERY DAY 90 tablet 1   fluticasone  (FLONASE ) 50 MCG/ACT nasal spray Place 1 spray into both nostrils daily as needed for allergies or rhinitis.     Lactobacillus (DIGESTIVE HEALTH PROBIOTIC) CAPS Take 1 capsule by mouth daily.     naproxen sodium (ALEVE) 220 MG tablet Take 440 mg by mouth daily as needed (pain).     NON FORMULARY Menopause vitamins daily     Vitamin D, Cholecalciferol, 25 MCG (1000 UT) CAPS Take by mouth. Takes occasionally     No current facility-administered medications on file prior to visit.    Review of Systems  Constitutional:  Negative for activity change, appetite change, fatigue, fever and unexpected weight change.  HENT:  Negative for congestion, ear pain, rhinorrhea, sinus pressure and sore throat.   Eyes:  Negative for pain, redness and visual disturbance.  Respiratory:  Negative for cough, shortness of breath and wheezing.   Cardiovascular:  Negative for chest pain and palpitations.  Gastrointestinal:  Negative for abdominal pain, blood in stool, constipation and diarrhea.  Endocrine: Negative for polydipsia and polyuria.  Genitourinary:  Negative for dysuria, frequency and urgency.  Musculoskeletal:  Negative for arthralgias, back pain and myalgias.  Skin:  Negative for pallor and rash.  Allergic/Immunologic: Negative for environmental allergies.  Neurological:  Negative  for dizziness, syncope and headaches.  Hematological:  Negative for adenopathy. Does not bruise/bleed easily.  Psychiatric/Behavioral:  Negative for decreased concentration and dysphoric mood. The patient is not nervous/anxious.        Objective:   Physical Exam Constitutional:      General: She is not in acute distress.    Appearance: Normal appearance. She is well-developed. She is obese. She is not ill-appearing or diaphoretic.  HENT:     Head: Normocephalic and atraumatic.     Right Ear: Tympanic membrane, ear canal and external ear normal.     Left Ear: Tympanic membrane, ear canal and external ear normal.     Nose: Nose normal. No congestion.     Mouth/Throat:     Mouth: Mucous membranes are moist.     Pharynx: Oropharynx is clear. No posterior oropharyngeal erythema.  Eyes:     General: No scleral icterus.    Extraocular Movements: Extraocular movements intact.     Conjunctiva/sclera: Conjunctivae normal.     Pupils: Pupils are equal, round, and reactive to light.  Neck:     Thyroid: No thyromegaly.     Vascular: No carotid bruit or JVD.  Cardiovascular:     Rate and Rhythm: Normal rate and regular rhythm.     Pulses: Normal pulses.     Heart sounds: Normal heart sounds.     No gallop.  Pulmonary:     Effort: Pulmonary effort is normal. No respiratory distress.  Breath sounds: Normal breath sounds. No wheezing.     Comments: Good air exch Chest:     Chest wall: No tenderness.  Abdominal:     General: Bowel sounds are normal. There is no distension or abdominal bruit.     Palpations: Abdomen is soft. There is no mass.     Tenderness: There is no abdominal tenderness.     Hernia: No hernia is present.  Genitourinary:    Comments: Breast and pelvic exam are done by gyn provider     Musculoskeletal:        General: No tenderness. Normal range of motion.     Cervical back: Normal range of motion and neck supple. No rigidity. No muscular tenderness.     Right  lower leg: No edema.     Left lower leg: No edema.     Comments: No kyphosis   Lymphadenopathy:     Cervical: No cervical adenopathy.  Skin:    General: Skin is warm and dry.     Coloration: Skin is not pale.     Findings: No erythema or rash.  Neurological:     Mental Status: She is alert. Mental status is at baseline.     Cranial Nerves: No cranial nerve deficit.     Motor: No abnormal muscle tone.     Coordination: Coordination normal.     Gait: Gait normal.     Deep Tendon Reflexes: Reflexes are normal and symmetric. Reflexes normal.  Psychiatric:        Mood and Affect: Mood normal.        Cognition and Memory: Cognition and memory normal.           Assessment & Plan:   Problem List Items Addressed This Visit       Digestive   GERD (gastroesophageal reflux disease)   This is improved with better eating  Takes pepcid  prn  Encouraged to avoid triggers         Other   Routine general medical examination at a health care facility - Primary   Reviewed health habits including diet and exercise and skin cancer prevention Reviewed appropriate screening tests for age  Also reviewed health mt list, fam hx and immunization status , as well as social and family history   See HPI Labs reviewed and ordered Health Maintenance  Topic Date Due   COVID-19 Vaccine (5 - 2024-25 season) 08/14/2025*   Flu Shot  11/02/2023   Mammogram  12/01/2023   Colon Cancer Screening  06/12/2026   DTaP/Tdap/Td vaccine (4 - Td or Tdap) 03/18/2027   Hepatitis C Screening  Completed   HIV Screening  Completed   Zoster (Shingles) Vaccine  Completed   HPV Vaccine  Aged Out   Meningitis B Vaccine  Aged Out  *Topic was postponed. The date shown is not the original due date.    Sees gyn  Discussed fall prevention, supplements and exercise for bone density  PHQ 0       Prediabetes   Lab Results  Component Value Date   HGBA1C 6.1 06/04/2023   HGBA1C 6.0 07/21/2022   HGBA1C 6.0 (H)  07/12/2021   disc imp of low glycemic diet and wt loss to prevent DM2       Obesity (BMI 30-39.9)   Discussed how this problem influences overall health and the risks it imposes  Reviewed plan for weight loss with lower calorie diet (via better food choices (lower glycemic and portion control) along with  exercise building up to or more than 30 minutes 5 days per week including some aerobic activity and strength training    Doing great with exercise       Hyperlipidemia   Disc goals for lipids and reasons to control them Rev last labs with pt Rev low sat fat diet in detail HDL is up and LDL down slightly   Given info re: cardiac ca score  Will continue working on diet         Hx of adenomatous colonic polyps   Colonoscopy 06/2019 with 7 y recall      Generalized anxiety disorder   Overall improved

## 2023-07-30 NOTE — Assessment & Plan Note (Signed)
 This is improved with better eating  Takes pepcid  prn  Encouraged to avoid triggers

## 2023-07-30 NOTE — Assessment & Plan Note (Signed)
 Lab Results  Component Value Date   HGBA1C 6.1 06/04/2023   HGBA1C 6.0 07/21/2022   HGBA1C 6.0 (H) 07/12/2021   disc imp of low glycemic diet and wt loss to prevent DM2

## 2023-07-30 NOTE — Assessment & Plan Note (Signed)
 Disc goals for lipids and reasons to control them Rev last labs with pt Rev low sat fat diet in detail HDL is up and LDL down slightly   Given info re: cardiac ca score  Will continue working on diet

## 2023-07-30 NOTE — Assessment & Plan Note (Signed)
Overall improved.  

## 2023-07-30 NOTE — Assessment & Plan Note (Signed)
Colonoscopy 06/2019 with 7 y recall

## 2023-07-30 NOTE — Assessment & Plan Note (Signed)
 Discussed how this problem influences overall health and the risks it imposes  Reviewed plan for weight loss with lower calorie diet (via better food choices (lower glycemic and portion control) along with exercise building up to or more than 30 minutes 5 days per week including some aerobic activity and strength training    Doing great with exercise

## 2023-07-30 NOTE — Assessment & Plan Note (Signed)
 Reviewed health habits including diet and exercise and skin cancer prevention Reviewed appropriate screening tests for age  Also reviewed health mt list, fam hx and immunization status , as well as social and family history   See HPI Labs reviewed and ordered Health Maintenance  Topic Date Due   COVID-19 Vaccine (5 - 2024-25 season) 08/14/2025*   Flu Shot  11/02/2023   Mammogram  12/01/2023   Colon Cancer Screening  06/12/2026   DTaP/Tdap/Td vaccine (4 - Td or Tdap) 03/18/2027   Hepatitis C Screening  Completed   HIV Screening  Completed   Zoster (Shingles) Vaccine  Completed   HPV Vaccine  Aged Out   Meningitis B Vaccine  Aged Out  *Topic was postponed. The date shown is not the original due date.    Sees gyn  Discussed fall prevention, supplements and exercise for bone density  PHQ 0

## 2023-12-07 DIAGNOSIS — Z1231 Encounter for screening mammogram for malignant neoplasm of breast: Secondary | ICD-10-CM | POA: Diagnosis not present

## 2023-12-07 LAB — HM MAMMOGRAPHY

## 2023-12-10 ENCOUNTER — Encounter: Payer: Self-pay | Admitting: Family Medicine

## 2024-04-04 ENCOUNTER — Encounter: Payer: Self-pay | Admitting: Family Medicine

## 2024-04-05 NOTE — Telephone Encounter (Signed)
 Please schedule a visit / triage if needed,  Thanks

## 2024-04-09 ENCOUNTER — Ambulatory Visit: Admitting: Family Medicine

## 2024-04-09 ENCOUNTER — Encounter: Payer: Self-pay | Admitting: Family Medicine

## 2024-04-09 VITALS — BP 128/85 | HR 64 | Temp 98.4°F | Ht 65.0 in | Wt 185.1 lb

## 2024-04-09 DIAGNOSIS — J069 Acute upper respiratory infection, unspecified: Secondary | ICD-10-CM | POA: Diagnosis not present

## 2024-04-09 MED ORDER — PREDNISONE 20 MG PO TABS: 20.0000 mg | ORAL_TABLET | Freq: Every day | ORAL | 0 refills | Status: AC

## 2024-04-09 MED ORDER — BENZONATATE 200 MG PO CAPS: 200.0000 mg | ORAL_CAPSULE | Freq: Two times a day (BID) | ORAL | 1 refills | Status: AC | PRN

## 2024-04-09 NOTE — Progress Notes (Signed)
 "  Subjective:    Patient ID: Peggy Hicks, female    DOB: 09/26/1965, 59 y.o.   MRN: [id number]  HPI  Wt Readings from Last 3 Encounters:  04/09/24 185 lb 2 oz (84 kg)  07/30/23 183 lb 6 oz (83.2 kg)  07/03/23 185 lb (83.9 kg)   30.81 kg/m  Vitals:   04/09/24 1440 04/09/24 1513  BP: (!) 148/92 128/85  Pulse: 64   Temp: 98.4 F (36.9 C)   SpO2: 98%     Pt presents for uri symptoms for 1 week  Started with ST and right ear pain  Then cough   Cough- dry for the most part/cannot stop coughing  Some wheezing at night  Not shortness of breath  ST-improved but hurts to cough  Some nasal congestion   Ear pain -improved Did have episode of vertigo   No fever  Perhaps occational chills early on     Over the counter  Alka selzer cold  Mucinex / fast max for cough and congestion  Saline nasal spray      Patient Active Problem List   Diagnosis Date Noted   Viral URI with cough 04/09/2024   GERD (gastroesophageal reflux disease) 07/30/2023   Piriformis muscle pain 05/18/2021   Hyperlipidemia 07/12/2020   Obesity (BMI 30-39.9) 01/20/2020   Hx of adenomatous colonic polyps 04/14/2014   Routine general medical examination at a health care facility 01/01/2012   Prediabetes 11/15/2010   PRIMARY FOCAL HYPERHIDROSIS 10/22/2007   Generalized anxiety disorder 08/01/2006   Past Medical History:  Diagnosis Date   Allergy    Arthritis    knee   Fibroids    GERD (gastroesophageal reflux disease)    Heart valve regurgitation 2020   Hx of adenomatous colonic polyps 04/14/2014   Hyperhidrosis    Stress reaction    Past Surgical History:  Procedure Laterality Date   BREAST BIOPSY     BREAST SURGERY     Breast Reduction   CESAREAN SECTION      X 2   COLONOSCOPY     HYSTERECTOMY ABDOMINAL WITH SALPINGECTOMY Bilateral 06/04/2018   Procedure: HYSTERECTOMY ABDOMINAL WITH SALPINGECTOMY;  Surgeon: Rockney Evalene SQUIBB, MD;  Location: [facility];  Service:  Gynecology;  Laterality: Bilateral;   HYSTEROSCOPY     MYOMECTOMY     Uterine fibroids removed/ hysteroscopic submucous myomectomy   POLYPECTOMY     TUBAL LIGATION     Social History[1] Family History  Problem Relation Age of Onset   Hypertension Mother    Heart disease Mother    Diabetes Mother    Colon polyps Mother    Hypertension Father    Heart disease Father    Cancer Maternal Aunt        liver   Colon cancer Neg Hx    Esophageal cancer Neg Hx    Rectal cancer Neg Hx    Stomach cancer Neg Hx    Allergies[2] Medications Ordered Prior to Encounter[3]  Review of Systems  Constitutional:  Positive for fatigue. Negative for appetite change and fever.  HENT:  Positive for congestion, postnasal drip, rhinorrhea and sore throat. Negative for ear pain, sinus pressure and sneezing.   Eyes:  Negative for pain and discharge.  Respiratory:  Positive for cough and wheezing. Negative for shortness of breath and stridor.   Cardiovascular:  Negative for chest pain.  Gastrointestinal:  Negative for diarrhea, nausea and vomiting.  Genitourinary:  Negative for frequency, hematuria and urgency.  Musculoskeletal:  Negative for arthralgias and myalgias.  Skin:  Negative for rash.  Neurological:  Negative for dizziness, weakness, light-headedness and headaches.  Psychiatric/Behavioral:  Negative for confusion and dysphoric mood.        Objective:   Physical Exam Constitutional:      General: She is not in acute distress.    Appearance: Normal appearance. She is well-developed. She is obese. She is not ill-appearing, toxic-appearing or diaphoretic.  HENT:     Head: Normocephalic and atraumatic.     Comments: Nares are injected and congested    No sinus tenderness or facial swelling    Right Ear: Tympanic membrane, ear canal and external ear normal.     Left Ear: Tympanic membrane, ear canal and external ear normal.     Ears:     Comments: TMs are mildly dull    Nose: Congestion  and rhinorrhea present.     Mouth/Throat:     Mouth: Mucous membranes are moist.     Pharynx: Oropharynx is clear. No oropharyngeal exudate or posterior oropharyngeal erythema.     Comments: Clear pnd  Eyes:     General:        Right eye: No discharge.        Left eye: No discharge.     Conjunctiva/sclera: Conjunctivae normal.     Pupils: Pupils are equal, round, and reactive to light.  Cardiovascular:     Rate and Rhythm: Normal rate.     Heart sounds: Normal heart sounds.  Pulmonary:     Effort: Pulmonary effort is normal. No respiratory distress.     Breath sounds: Normal breath sounds. No stridor. No wheezing, rhonchi or rales.     Comments: No wheeze even on forced exp  Musculoskeletal:     Cervical back: Normal range of motion and neck supple.  Lymphadenopathy:     Cervical: No cervical adenopathy.  Skin:    General: Skin is warm and dry.     Capillary Refill: Capillary refill takes less than 2 seconds.     Findings: No rash.  Neurological:     Mental Status: She is alert.     Cranial Nerves: No cranial nerve deficit.  Psychiatric:        Mood and Affect: Mood normal.           Assessment & Plan:   Problem List Items Addressed This Visit       Respiratory   Viral URI with cough - Primary   Day 7 Persistent cough and some wheezing just at night  Other symptoms have improved  No fever  Reassuring exam Disc symptomatic care - see instructions on AVS, mucinex DM may be most helpful   Prescription prednisone  20 mg daily for 5 d for wheeze and congestion (reviewed side effects)  Tessalon  for addn cough control   Update if not starting to improve in a week or if worsening  Call back and Er precautions noted in detail today    Handout given          [1]  Social History Tobacco Use   Smoking status: Never   Smokeless tobacco: Never  Vaping Use   Vaping status: Never Used  Substance Use Topics   Alcohol use: Yes    Comment: Rare   Drug use: No   [2]  Allergies Allergen Reactions   Shrimp Flavor Agent (Non-Screening)     Feels like throat closes up when eat shrimp; 06/29/2022: reports having some recently  and was fine.   [3]  Current Outpatient Medications on File Prior to Visit  Medication Sig Dispense Refill   ALPRAZolam  (XANAX ) 0.5 MG tablet Take 1 tablet (0.5 mg total) by mouth daily as needed for anxiety. For traveling 30 tablet 0   cetirizine (ZYRTEC) 10 MG tablet Take 10 mg by mouth daily as needed for allergies.     ELDERBERRY PO Take by mouth. Takes occassionally     famotidine  (PEPCID ) 40 MG tablet TAKE 1 TABLET BY MOUTH EVERY DAY 90 tablet 1   fluticasone  (FLONASE ) 50 MCG/ACT nasal spray Place 1 spray into both nostrils daily as needed for allergies or rhinitis.     Lactobacillus (DIGESTIVE HEALTH PROBIOTIC) CAPS Take 1 capsule by mouth daily.     naproxen sodium (ALEVE) 220 MG tablet Take 440 mg by mouth daily as needed (pain).     NON FORMULARY Menopause vitamins daily     Vitamin D, Cholecalciferol, 25 MCG (1000 UT) CAPS Take by mouth. Takes occasionally     No current facility-administered medications on file prior to visit.   "

## 2024-04-09 NOTE — Patient Instructions (Addendum)
 Drink fluids and rest  mucinex DM (pill or liquid) is good for cough and congestion  Nasal saline for congestion as needed  Tylenol  for fever or pain or headache  Please alert us  if symptoms worsen (if severe or short of breath please go to the ER)    Take prednisone  20 mg once daily in the morning for 5 days This is for congestion and wheezing  Try tessalon  pearles - up to every 8 hours for cough (in addition to the mucinex DM if needed)   Update if not starting to improve in a week or if worsening

## 2024-04-09 NOTE — Assessment & Plan Note (Addendum)
 Day 7 Persistent cough and some wheezing just at night  Other symptoms have improved  No fever  Reassuring exam Disc symptomatic care - see instructions on AVS, mucinex DM may be most helpful   Prescription prednisone  20 mg daily for 5 d for wheeze and congestion (reviewed side effects)  Tessalon  for addn cough control   Update if not starting to improve in a week or if worsening  Call back and Er precautions noted in detail today    Handout given

## 2024-04-24 ENCOUNTER — Encounter: Payer: Self-pay | Admitting: Nurse Practitioner

## 2024-04-24 MED ORDER — METRONIDAZOLE 500 MG PO TABS: 500.0000 mg | ORAL_TABLET | Freq: Two times a day (BID) | ORAL | 0 refills | Status: AC

## 2024-04-24 NOTE — Telephone Encounter (Signed)
 Ok to offer flagyl  or metrogel . If sx do not improve recommend OV with TW
